# Patient Record
Sex: Female | Born: 1990 | Race: Black or African American | Hispanic: No | Marital: Single
Health system: Southern US, Community
[De-identification: ages and names within clinical notes are randomized; demographics above are authoritative.]

## PROBLEM LIST (undated history)

## (undated) DIAGNOSIS — R000 Tachycardia, unspecified: Secondary | ICD-10-CM

## (undated) DIAGNOSIS — I950 Idiopathic hypotension: Secondary | ICD-10-CM

## (undated) DIAGNOSIS — G931 Anoxic brain damage, not elsewhere classified: Secondary | ICD-10-CM

## (undated) DIAGNOSIS — B199 Unspecified viral hepatitis without hepatic coma: Secondary | ICD-10-CM

## (undated) DIAGNOSIS — H16012 Central corneal ulcer, left eye: Secondary | ICD-10-CM

## (undated) DIAGNOSIS — I469 Cardiac arrest, cause unspecified: Secondary | ICD-10-CM

## (undated) DIAGNOSIS — J9600 Acute respiratory failure, unspecified whether with hypoxia or hypercapnia: Secondary | ICD-10-CM

---

## 2017-11-27 DIAGNOSIS — Z930 Tracheostomy status: Secondary | ICD-10-CM

## 2017-11-27 DIAGNOSIS — J9620 Acute and chronic respiratory failure, unspecified whether with hypoxia or hypercapnia: Secondary | ICD-10-CM | POA: Diagnosis not present

## 2017-11-27 DIAGNOSIS — J181 Lobar pneumonia, unspecified organism: Secondary | ICD-10-CM | POA: Diagnosis not present

## 2017-11-27 DIAGNOSIS — G931 Anoxic brain damage, not elsewhere classified: Secondary | ICD-10-CM

## 2017-11-28 DIAGNOSIS — J9620 Acute and chronic respiratory failure, unspecified whether with hypoxia or hypercapnia: Secondary | ICD-10-CM

## 2017-11-28 DIAGNOSIS — Z930 Tracheostomy status: Secondary | ICD-10-CM | POA: Diagnosis not present

## 2017-11-28 DIAGNOSIS — J181 Lobar pneumonia, unspecified organism: Secondary | ICD-10-CM

## 2017-11-28 DIAGNOSIS — G931 Anoxic brain damage, not elsewhere classified: Secondary | ICD-10-CM | POA: Diagnosis not present

## 2017-11-29 DIAGNOSIS — J9620 Acute and chronic respiratory failure, unspecified whether with hypoxia or hypercapnia: Secondary | ICD-10-CM | POA: Diagnosis not present

## 2017-11-29 DIAGNOSIS — J181 Lobar pneumonia, unspecified organism: Secondary | ICD-10-CM | POA: Diagnosis not present

## 2017-11-29 DIAGNOSIS — Z930 Tracheostomy status: Secondary | ICD-10-CM | POA: Diagnosis not present

## 2017-11-29 DIAGNOSIS — G931 Anoxic brain damage, not elsewhere classified: Secondary | ICD-10-CM

## 2017-11-30 DIAGNOSIS — J9620 Acute and chronic respiratory failure, unspecified whether with hypoxia or hypercapnia: Secondary | ICD-10-CM

## 2017-11-30 DIAGNOSIS — Z930 Tracheostomy status: Secondary | ICD-10-CM

## 2017-11-30 DIAGNOSIS — G931 Anoxic brain damage, not elsewhere classified: Secondary | ICD-10-CM

## 2017-11-30 DIAGNOSIS — J181 Lobar pneumonia, unspecified organism: Secondary | ICD-10-CM | POA: Diagnosis not present

## 2017-12-01 DIAGNOSIS — J181 Lobar pneumonia, unspecified organism: Secondary | ICD-10-CM | POA: Diagnosis not present

## 2017-12-01 DIAGNOSIS — Z930 Tracheostomy status: Secondary | ICD-10-CM | POA: Diagnosis not present

## 2017-12-01 DIAGNOSIS — G931 Anoxic brain damage, not elsewhere classified: Secondary | ICD-10-CM | POA: Diagnosis not present

## 2017-12-01 DIAGNOSIS — J9620 Acute and chronic respiratory failure, unspecified whether with hypoxia or hypercapnia: Secondary | ICD-10-CM

## 2017-12-02 DIAGNOSIS — J9620 Acute and chronic respiratory failure, unspecified whether with hypoxia or hypercapnia: Secondary | ICD-10-CM

## 2017-12-02 DIAGNOSIS — J181 Lobar pneumonia, unspecified organism: Secondary | ICD-10-CM

## 2017-12-02 DIAGNOSIS — G931 Anoxic brain damage, not elsewhere classified: Secondary | ICD-10-CM

## 2017-12-02 DIAGNOSIS — Z930 Tracheostomy status: Secondary | ICD-10-CM

## 2017-12-03 DIAGNOSIS — G931 Anoxic brain damage, not elsewhere classified: Secondary | ICD-10-CM

## 2017-12-03 DIAGNOSIS — J181 Lobar pneumonia, unspecified organism: Secondary | ICD-10-CM

## 2017-12-03 DIAGNOSIS — Z930 Tracheostomy status: Secondary | ICD-10-CM | POA: Diagnosis not present

## 2017-12-03 DIAGNOSIS — J9620 Acute and chronic respiratory failure, unspecified whether with hypoxia or hypercapnia: Secondary | ICD-10-CM

## 2017-12-11 DIAGNOSIS — G931 Anoxic brain damage, not elsewhere classified: Secondary | ICD-10-CM

## 2017-12-11 DIAGNOSIS — J9620 Acute and chronic respiratory failure, unspecified whether with hypoxia or hypercapnia: Secondary | ICD-10-CM

## 2017-12-11 DIAGNOSIS — J181 Lobar pneumonia, unspecified organism: Secondary | ICD-10-CM | POA: Diagnosis not present

## 2017-12-11 DIAGNOSIS — Z930 Tracheostomy status: Secondary | ICD-10-CM | POA: Diagnosis not present

## 2017-12-12 DIAGNOSIS — Z930 Tracheostomy status: Secondary | ICD-10-CM | POA: Diagnosis not present

## 2017-12-12 DIAGNOSIS — J9620 Acute and chronic respiratory failure, unspecified whether with hypoxia or hypercapnia: Secondary | ICD-10-CM | POA: Diagnosis not present

## 2017-12-12 DIAGNOSIS — G931 Anoxic brain damage, not elsewhere classified: Secondary | ICD-10-CM | POA: Diagnosis not present

## 2017-12-12 DIAGNOSIS — J181 Lobar pneumonia, unspecified organism: Secondary | ICD-10-CM

## 2017-12-13 DIAGNOSIS — Z930 Tracheostomy status: Secondary | ICD-10-CM | POA: Diagnosis not present

## 2017-12-13 DIAGNOSIS — J181 Lobar pneumonia, unspecified organism: Secondary | ICD-10-CM | POA: Diagnosis not present

## 2017-12-13 DIAGNOSIS — G931 Anoxic brain damage, not elsewhere classified: Secondary | ICD-10-CM

## 2017-12-13 DIAGNOSIS — J9620 Acute and chronic respiratory failure, unspecified whether with hypoxia or hypercapnia: Secondary | ICD-10-CM | POA: Diagnosis not present

## 2017-12-14 DIAGNOSIS — Z930 Tracheostomy status: Secondary | ICD-10-CM

## 2017-12-14 DIAGNOSIS — J181 Lobar pneumonia, unspecified organism: Secondary | ICD-10-CM

## 2017-12-14 DIAGNOSIS — J9620 Acute and chronic respiratory failure, unspecified whether with hypoxia or hypercapnia: Secondary | ICD-10-CM

## 2017-12-14 DIAGNOSIS — G931 Anoxic brain damage, not elsewhere classified: Secondary | ICD-10-CM

## 2017-12-15 DIAGNOSIS — G931 Anoxic brain damage, not elsewhere classified: Secondary | ICD-10-CM | POA: Diagnosis not present

## 2017-12-15 DIAGNOSIS — J181 Lobar pneumonia, unspecified organism: Secondary | ICD-10-CM

## 2017-12-15 DIAGNOSIS — J9620 Acute and chronic respiratory failure, unspecified whether with hypoxia or hypercapnia: Secondary | ICD-10-CM | POA: Diagnosis not present

## 2017-12-15 DIAGNOSIS — Z930 Tracheostomy status: Secondary | ICD-10-CM

## 2017-12-16 DIAGNOSIS — Z930 Tracheostomy status: Secondary | ICD-10-CM | POA: Diagnosis not present

## 2017-12-16 DIAGNOSIS — G931 Anoxic brain damage, not elsewhere classified: Secondary | ICD-10-CM | POA: Diagnosis not present

## 2017-12-16 DIAGNOSIS — J181 Lobar pneumonia, unspecified organism: Secondary | ICD-10-CM

## 2017-12-16 DIAGNOSIS — J9620 Acute and chronic respiratory failure, unspecified whether with hypoxia or hypercapnia: Secondary | ICD-10-CM

## 2017-12-17 DIAGNOSIS — J181 Lobar pneumonia, unspecified organism: Secondary | ICD-10-CM | POA: Diagnosis not present

## 2017-12-17 DIAGNOSIS — Z930 Tracheostomy status: Secondary | ICD-10-CM | POA: Diagnosis not present

## 2017-12-17 DIAGNOSIS — G931 Anoxic brain damage, not elsewhere classified: Secondary | ICD-10-CM

## 2017-12-17 DIAGNOSIS — J9620 Acute and chronic respiratory failure, unspecified whether with hypoxia or hypercapnia: Secondary | ICD-10-CM

## 2018-01-08 DIAGNOSIS — Z930 Tracheostomy status: Secondary | ICD-10-CM

## 2018-01-08 DIAGNOSIS — J181 Lobar pneumonia, unspecified organism: Secondary | ICD-10-CM | POA: Diagnosis not present

## 2018-01-08 DIAGNOSIS — G931 Anoxic brain damage, not elsewhere classified: Secondary | ICD-10-CM

## 2018-01-08 DIAGNOSIS — J9620 Acute and chronic respiratory failure, unspecified whether with hypoxia or hypercapnia: Secondary | ICD-10-CM

## 2018-01-09 DIAGNOSIS — J181 Lobar pneumonia, unspecified organism: Secondary | ICD-10-CM

## 2018-01-09 DIAGNOSIS — J9620 Acute and chronic respiratory failure, unspecified whether with hypoxia or hypercapnia: Secondary | ICD-10-CM

## 2018-01-09 DIAGNOSIS — Z930 Tracheostomy status: Secondary | ICD-10-CM

## 2018-01-09 DIAGNOSIS — G931 Anoxic brain damage, not elsewhere classified: Secondary | ICD-10-CM

## 2018-01-10 DIAGNOSIS — J9620 Acute and chronic respiratory failure, unspecified whether with hypoxia or hypercapnia: Secondary | ICD-10-CM

## 2018-01-10 DIAGNOSIS — Z930 Tracheostomy status: Secondary | ICD-10-CM | POA: Diagnosis not present

## 2018-01-10 DIAGNOSIS — G931 Anoxic brain damage, not elsewhere classified: Secondary | ICD-10-CM | POA: Diagnosis not present

## 2018-01-10 DIAGNOSIS — J181 Lobar pneumonia, unspecified organism: Secondary | ICD-10-CM

## 2018-01-11 DIAGNOSIS — J9620 Acute and chronic respiratory failure, unspecified whether with hypoxia or hypercapnia: Secondary | ICD-10-CM

## 2018-01-11 DIAGNOSIS — Z930 Tracheostomy status: Secondary | ICD-10-CM

## 2018-01-11 DIAGNOSIS — J181 Lobar pneumonia, unspecified organism: Secondary | ICD-10-CM

## 2018-01-11 DIAGNOSIS — G931 Anoxic brain damage, not elsewhere classified: Secondary | ICD-10-CM

## 2018-01-12 DIAGNOSIS — G931 Anoxic brain damage, not elsewhere classified: Secondary | ICD-10-CM

## 2018-01-12 DIAGNOSIS — J9620 Acute and chronic respiratory failure, unspecified whether with hypoxia or hypercapnia: Secondary | ICD-10-CM | POA: Diagnosis not present

## 2018-01-12 DIAGNOSIS — Z930 Tracheostomy status: Secondary | ICD-10-CM

## 2018-01-12 DIAGNOSIS — J181 Lobar pneumonia, unspecified organism: Secondary | ICD-10-CM | POA: Diagnosis not present

## 2018-01-13 DIAGNOSIS — Z930 Tracheostomy status: Secondary | ICD-10-CM | POA: Diagnosis not present

## 2018-01-13 DIAGNOSIS — G931 Anoxic brain damage, not elsewhere classified: Secondary | ICD-10-CM

## 2018-01-13 DIAGNOSIS — J9620 Acute and chronic respiratory failure, unspecified whether with hypoxia or hypercapnia: Secondary | ICD-10-CM | POA: Diagnosis not present

## 2018-01-13 DIAGNOSIS — J181 Lobar pneumonia, unspecified organism: Secondary | ICD-10-CM | POA: Diagnosis not present

## 2018-01-14 DIAGNOSIS — G931 Anoxic brain damage, not elsewhere classified: Secondary | ICD-10-CM

## 2018-01-14 DIAGNOSIS — Z930 Tracheostomy status: Secondary | ICD-10-CM

## 2018-01-14 DIAGNOSIS — J9620 Acute and chronic respiratory failure, unspecified whether with hypoxia or hypercapnia: Secondary | ICD-10-CM | POA: Diagnosis not present

## 2018-01-14 DIAGNOSIS — J181 Lobar pneumonia, unspecified organism: Secondary | ICD-10-CM | POA: Diagnosis not present

## 2018-01-22 DIAGNOSIS — Z930 Tracheostomy status: Secondary | ICD-10-CM | POA: Diagnosis not present

## 2018-01-22 DIAGNOSIS — J9620 Acute and chronic respiratory failure, unspecified whether with hypoxia or hypercapnia: Secondary | ICD-10-CM

## 2018-01-22 DIAGNOSIS — G931 Anoxic brain damage, not elsewhere classified: Secondary | ICD-10-CM

## 2018-01-22 DIAGNOSIS — J181 Lobar pneumonia, unspecified organism: Secondary | ICD-10-CM | POA: Diagnosis not present

## 2018-01-23 DIAGNOSIS — J9620 Acute and chronic respiratory failure, unspecified whether with hypoxia or hypercapnia: Secondary | ICD-10-CM

## 2018-01-23 DIAGNOSIS — Z930 Tracheostomy status: Secondary | ICD-10-CM | POA: Diagnosis not present

## 2018-01-23 DIAGNOSIS — G931 Anoxic brain damage, not elsewhere classified: Secondary | ICD-10-CM | POA: Diagnosis not present

## 2018-01-23 DIAGNOSIS — J181 Lobar pneumonia, unspecified organism: Secondary | ICD-10-CM | POA: Diagnosis not present

## 2018-01-24 DIAGNOSIS — J181 Lobar pneumonia, unspecified organism: Secondary | ICD-10-CM | POA: Diagnosis not present

## 2018-01-24 DIAGNOSIS — J9620 Acute and chronic respiratory failure, unspecified whether with hypoxia or hypercapnia: Secondary | ICD-10-CM

## 2018-01-24 DIAGNOSIS — Z930 Tracheostomy status: Secondary | ICD-10-CM

## 2018-01-24 DIAGNOSIS — G931 Anoxic brain damage, not elsewhere classified: Secondary | ICD-10-CM

## 2018-01-25 DIAGNOSIS — J9620 Acute and chronic respiratory failure, unspecified whether with hypoxia or hypercapnia: Secondary | ICD-10-CM

## 2018-01-25 DIAGNOSIS — G931 Anoxic brain damage, not elsewhere classified: Secondary | ICD-10-CM | POA: Diagnosis not present

## 2018-01-25 DIAGNOSIS — J181 Lobar pneumonia, unspecified organism: Secondary | ICD-10-CM

## 2018-01-25 DIAGNOSIS — Z930 Tracheostomy status: Secondary | ICD-10-CM | POA: Diagnosis not present

## 2018-01-26 DIAGNOSIS — G931 Anoxic brain damage, not elsewhere classified: Secondary | ICD-10-CM

## 2018-01-26 DIAGNOSIS — Z930 Tracheostomy status: Secondary | ICD-10-CM | POA: Diagnosis not present

## 2018-01-26 DIAGNOSIS — J9620 Acute and chronic respiratory failure, unspecified whether with hypoxia or hypercapnia: Secondary | ICD-10-CM | POA: Diagnosis not present

## 2018-01-26 DIAGNOSIS — J181 Lobar pneumonia, unspecified organism: Secondary | ICD-10-CM

## 2018-01-27 DIAGNOSIS — Z930 Tracheostomy status: Secondary | ICD-10-CM | POA: Diagnosis not present

## 2018-01-27 DIAGNOSIS — J9620 Acute and chronic respiratory failure, unspecified whether with hypoxia or hypercapnia: Secondary | ICD-10-CM | POA: Diagnosis not present

## 2018-01-27 DIAGNOSIS — J181 Lobar pneumonia, unspecified organism: Secondary | ICD-10-CM

## 2018-01-27 DIAGNOSIS — G931 Anoxic brain damage, not elsewhere classified: Secondary | ICD-10-CM

## 2018-01-28 DIAGNOSIS — G931 Anoxic brain damage, not elsewhere classified: Secondary | ICD-10-CM | POA: Diagnosis not present

## 2018-01-28 DIAGNOSIS — Z930 Tracheostomy status: Secondary | ICD-10-CM

## 2018-01-28 DIAGNOSIS — J181 Lobar pneumonia, unspecified organism: Secondary | ICD-10-CM | POA: Diagnosis not present

## 2018-01-28 DIAGNOSIS — J9620 Acute and chronic respiratory failure, unspecified whether with hypoxia or hypercapnia: Secondary | ICD-10-CM

## 2018-01-29 DIAGNOSIS — J181 Lobar pneumonia, unspecified organism: Secondary | ICD-10-CM

## 2018-01-29 DIAGNOSIS — G931 Anoxic brain damage, not elsewhere classified: Secondary | ICD-10-CM | POA: Diagnosis not present

## 2018-01-29 DIAGNOSIS — Z930 Tracheostomy status: Secondary | ICD-10-CM | POA: Diagnosis not present

## 2018-01-29 DIAGNOSIS — J9620 Acute and chronic respiratory failure, unspecified whether with hypoxia or hypercapnia: Secondary | ICD-10-CM | POA: Diagnosis not present

## 2018-01-30 DIAGNOSIS — G931 Anoxic brain damage, not elsewhere classified: Secondary | ICD-10-CM | POA: Diagnosis not present

## 2018-01-30 DIAGNOSIS — Z930 Tracheostomy status: Secondary | ICD-10-CM

## 2018-01-30 DIAGNOSIS — J181 Lobar pneumonia, unspecified organism: Secondary | ICD-10-CM | POA: Diagnosis not present

## 2018-01-30 DIAGNOSIS — J9620 Acute and chronic respiratory failure, unspecified whether with hypoxia or hypercapnia: Secondary | ICD-10-CM

## 2018-01-31 DIAGNOSIS — G931 Anoxic brain damage, not elsewhere classified: Secondary | ICD-10-CM | POA: Diagnosis not present

## 2018-01-31 DIAGNOSIS — J9620 Acute and chronic respiratory failure, unspecified whether with hypoxia or hypercapnia: Secondary | ICD-10-CM | POA: Diagnosis not present

## 2018-01-31 DIAGNOSIS — Z930 Tracheostomy status: Secondary | ICD-10-CM | POA: Diagnosis not present

## 2018-01-31 DIAGNOSIS — J181 Lobar pneumonia, unspecified organism: Secondary | ICD-10-CM | POA: Diagnosis not present

## 2018-02-01 DIAGNOSIS — J181 Lobar pneumonia, unspecified organism: Secondary | ICD-10-CM

## 2018-02-01 DIAGNOSIS — G931 Anoxic brain damage, not elsewhere classified: Secondary | ICD-10-CM | POA: Diagnosis not present

## 2018-02-01 DIAGNOSIS — J9620 Acute and chronic respiratory failure, unspecified whether with hypoxia or hypercapnia: Secondary | ICD-10-CM

## 2018-02-01 DIAGNOSIS — Z930 Tracheostomy status: Secondary | ICD-10-CM

## 2018-02-02 DIAGNOSIS — J181 Lobar pneumonia, unspecified organism: Secondary | ICD-10-CM

## 2018-02-02 DIAGNOSIS — J9620 Acute and chronic respiratory failure, unspecified whether with hypoxia or hypercapnia: Secondary | ICD-10-CM | POA: Diagnosis not present

## 2018-02-02 DIAGNOSIS — G931 Anoxic brain damage, not elsewhere classified: Secondary | ICD-10-CM

## 2018-02-02 DIAGNOSIS — Z930 Tracheostomy status: Secondary | ICD-10-CM | POA: Diagnosis not present

## 2018-02-03 DIAGNOSIS — G931 Anoxic brain damage, not elsewhere classified: Secondary | ICD-10-CM

## 2018-02-03 DIAGNOSIS — Z930 Tracheostomy status: Secondary | ICD-10-CM | POA: Diagnosis not present

## 2018-02-03 DIAGNOSIS — J9620 Acute and chronic respiratory failure, unspecified whether with hypoxia or hypercapnia: Secondary | ICD-10-CM

## 2018-02-03 DIAGNOSIS — J181 Lobar pneumonia, unspecified organism: Secondary | ICD-10-CM | POA: Diagnosis not present

## 2018-02-04 DIAGNOSIS — Z930 Tracheostomy status: Secondary | ICD-10-CM | POA: Diagnosis not present

## 2018-02-04 DIAGNOSIS — J181 Lobar pneumonia, unspecified organism: Secondary | ICD-10-CM

## 2018-02-04 DIAGNOSIS — J9620 Acute and chronic respiratory failure, unspecified whether with hypoxia or hypercapnia: Secondary | ICD-10-CM

## 2018-02-04 DIAGNOSIS — G931 Anoxic brain damage, not elsewhere classified: Secondary | ICD-10-CM

## 2018-02-05 DIAGNOSIS — J181 Lobar pneumonia, unspecified organism: Secondary | ICD-10-CM

## 2018-02-05 DIAGNOSIS — G931 Anoxic brain damage, not elsewhere classified: Secondary | ICD-10-CM

## 2018-02-05 DIAGNOSIS — Z930 Tracheostomy status: Secondary | ICD-10-CM

## 2018-02-05 DIAGNOSIS — J9620 Acute and chronic respiratory failure, unspecified whether with hypoxia or hypercapnia: Secondary | ICD-10-CM

## 2018-02-06 DIAGNOSIS — J181 Lobar pneumonia, unspecified organism: Secondary | ICD-10-CM

## 2018-02-06 DIAGNOSIS — Z930 Tracheostomy status: Secondary | ICD-10-CM | POA: Diagnosis not present

## 2018-02-06 DIAGNOSIS — J9620 Acute and chronic respiratory failure, unspecified whether with hypoxia or hypercapnia: Secondary | ICD-10-CM | POA: Diagnosis not present

## 2018-02-06 DIAGNOSIS — G931 Anoxic brain damage, not elsewhere classified: Secondary | ICD-10-CM

## 2018-02-07 DIAGNOSIS — J9620 Acute and chronic respiratory failure, unspecified whether with hypoxia or hypercapnia: Secondary | ICD-10-CM | POA: Diagnosis not present

## 2018-02-07 DIAGNOSIS — Z930 Tracheostomy status: Secondary | ICD-10-CM

## 2018-02-07 DIAGNOSIS — G931 Anoxic brain damage, not elsewhere classified: Secondary | ICD-10-CM

## 2018-02-07 DIAGNOSIS — J181 Lobar pneumonia, unspecified organism: Secondary | ICD-10-CM

## 2018-02-08 DIAGNOSIS — Z930 Tracheostomy status: Secondary | ICD-10-CM | POA: Diagnosis not present

## 2018-02-08 DIAGNOSIS — J9620 Acute and chronic respiratory failure, unspecified whether with hypoxia or hypercapnia: Secondary | ICD-10-CM | POA: Diagnosis not present

## 2018-02-08 DIAGNOSIS — G931 Anoxic brain damage, not elsewhere classified: Secondary | ICD-10-CM

## 2018-02-08 DIAGNOSIS — J181 Lobar pneumonia, unspecified organism: Secondary | ICD-10-CM

## 2018-02-09 DIAGNOSIS — Z930 Tracheostomy status: Secondary | ICD-10-CM

## 2018-02-09 DIAGNOSIS — G931 Anoxic brain damage, not elsewhere classified: Secondary | ICD-10-CM

## 2018-02-09 DIAGNOSIS — J181 Lobar pneumonia, unspecified organism: Secondary | ICD-10-CM

## 2018-02-09 DIAGNOSIS — J9620 Acute and chronic respiratory failure, unspecified whether with hypoxia or hypercapnia: Secondary | ICD-10-CM

## 2018-02-10 DIAGNOSIS — J9620 Acute and chronic respiratory failure, unspecified whether with hypoxia or hypercapnia: Secondary | ICD-10-CM | POA: Diagnosis not present

## 2018-02-10 DIAGNOSIS — G931 Anoxic brain damage, not elsewhere classified: Secondary | ICD-10-CM | POA: Diagnosis not present

## 2018-02-10 DIAGNOSIS — Z930 Tracheostomy status: Secondary | ICD-10-CM | POA: Diagnosis not present

## 2018-02-10 DIAGNOSIS — J181 Lobar pneumonia, unspecified organism: Secondary | ICD-10-CM

## 2018-02-11 DIAGNOSIS — G931 Anoxic brain damage, not elsewhere classified: Secondary | ICD-10-CM | POA: Diagnosis not present

## 2018-02-11 DIAGNOSIS — Z930 Tracheostomy status: Secondary | ICD-10-CM

## 2018-02-11 DIAGNOSIS — J181 Lobar pneumonia, unspecified organism: Secondary | ICD-10-CM | POA: Diagnosis not present

## 2018-02-11 DIAGNOSIS — J9620 Acute and chronic respiratory failure, unspecified whether with hypoxia or hypercapnia: Secondary | ICD-10-CM | POA: Diagnosis not present

## 2018-02-26 DIAGNOSIS — J181 Lobar pneumonia, unspecified organism: Secondary | ICD-10-CM

## 2018-02-26 DIAGNOSIS — G931 Anoxic brain damage, not elsewhere classified: Secondary | ICD-10-CM

## 2018-02-26 DIAGNOSIS — Z930 Tracheostomy status: Secondary | ICD-10-CM

## 2018-02-26 DIAGNOSIS — J9620 Acute and chronic respiratory failure, unspecified whether with hypoxia or hypercapnia: Secondary | ICD-10-CM

## 2018-02-27 DIAGNOSIS — J9620 Acute and chronic respiratory failure, unspecified whether with hypoxia or hypercapnia: Secondary | ICD-10-CM

## 2018-02-27 DIAGNOSIS — G931 Anoxic brain damage, not elsewhere classified: Secondary | ICD-10-CM | POA: Diagnosis not present

## 2018-02-27 DIAGNOSIS — J181 Lobar pneumonia, unspecified organism: Secondary | ICD-10-CM

## 2018-02-27 DIAGNOSIS — Z930 Tracheostomy status: Secondary | ICD-10-CM

## 2018-02-28 DIAGNOSIS — J9620 Acute and chronic respiratory failure, unspecified whether with hypoxia or hypercapnia: Secondary | ICD-10-CM | POA: Diagnosis not present

## 2018-02-28 DIAGNOSIS — Z930 Tracheostomy status: Secondary | ICD-10-CM | POA: Diagnosis not present

## 2018-02-28 DIAGNOSIS — G931 Anoxic brain damage, not elsewhere classified: Secondary | ICD-10-CM | POA: Diagnosis not present

## 2018-02-28 DIAGNOSIS — J181 Lobar pneumonia, unspecified organism: Secondary | ICD-10-CM | POA: Diagnosis not present

## 2018-03-01 DIAGNOSIS — Z930 Tracheostomy status: Secondary | ICD-10-CM

## 2018-03-01 DIAGNOSIS — G931 Anoxic brain damage, not elsewhere classified: Secondary | ICD-10-CM

## 2018-03-01 DIAGNOSIS — J181 Lobar pneumonia, unspecified organism: Secondary | ICD-10-CM

## 2018-03-01 DIAGNOSIS — J9620 Acute and chronic respiratory failure, unspecified whether with hypoxia or hypercapnia: Secondary | ICD-10-CM

## 2018-03-02 DIAGNOSIS — G931 Anoxic brain damage, not elsewhere classified: Secondary | ICD-10-CM

## 2018-03-02 DIAGNOSIS — Z930 Tracheostomy status: Secondary | ICD-10-CM

## 2018-03-02 DIAGNOSIS — J181 Lobar pneumonia, unspecified organism: Secondary | ICD-10-CM | POA: Diagnosis not present

## 2018-03-02 DIAGNOSIS — J9620 Acute and chronic respiratory failure, unspecified whether with hypoxia or hypercapnia: Secondary | ICD-10-CM

## 2018-03-03 DIAGNOSIS — G931 Anoxic brain damage, not elsewhere classified: Secondary | ICD-10-CM | POA: Diagnosis not present

## 2018-03-03 DIAGNOSIS — J9620 Acute and chronic respiratory failure, unspecified whether with hypoxia or hypercapnia: Secondary | ICD-10-CM | POA: Diagnosis not present

## 2018-03-03 DIAGNOSIS — J181 Lobar pneumonia, unspecified organism: Secondary | ICD-10-CM | POA: Diagnosis not present

## 2018-03-03 DIAGNOSIS — Z930 Tracheostomy status: Secondary | ICD-10-CM | POA: Diagnosis not present

## 2018-03-04 DIAGNOSIS — J181 Lobar pneumonia, unspecified organism: Secondary | ICD-10-CM

## 2018-03-04 DIAGNOSIS — J9620 Acute and chronic respiratory failure, unspecified whether with hypoxia or hypercapnia: Secondary | ICD-10-CM | POA: Diagnosis not present

## 2018-03-04 DIAGNOSIS — Z930 Tracheostomy status: Secondary | ICD-10-CM | POA: Diagnosis not present

## 2018-03-04 DIAGNOSIS — G931 Anoxic brain damage, not elsewhere classified: Secondary | ICD-10-CM | POA: Diagnosis not present

## 2018-03-09 DIAGNOSIS — J181 Lobar pneumonia, unspecified organism: Secondary | ICD-10-CM

## 2018-03-09 DIAGNOSIS — Z930 Tracheostomy status: Secondary | ICD-10-CM

## 2018-03-09 DIAGNOSIS — J9620 Acute and chronic respiratory failure, unspecified whether with hypoxia or hypercapnia: Secondary | ICD-10-CM

## 2018-03-09 DIAGNOSIS — G931 Anoxic brain damage, not elsewhere classified: Secondary | ICD-10-CM | POA: Diagnosis not present

## 2018-03-10 DIAGNOSIS — J9620 Acute and chronic respiratory failure, unspecified whether with hypoxia or hypercapnia: Secondary | ICD-10-CM

## 2018-03-10 DIAGNOSIS — J181 Lobar pneumonia, unspecified organism: Secondary | ICD-10-CM

## 2018-03-10 DIAGNOSIS — Z930 Tracheostomy status: Secondary | ICD-10-CM

## 2018-03-10 DIAGNOSIS — G931 Anoxic brain damage, not elsewhere classified: Secondary | ICD-10-CM | POA: Diagnosis not present

## 2018-03-11 DIAGNOSIS — J181 Lobar pneumonia, unspecified organism: Secondary | ICD-10-CM

## 2018-03-11 DIAGNOSIS — J9620 Acute and chronic respiratory failure, unspecified whether with hypoxia or hypercapnia: Secondary | ICD-10-CM

## 2018-03-11 DIAGNOSIS — Z930 Tracheostomy status: Secondary | ICD-10-CM

## 2018-03-11 DIAGNOSIS — G931 Anoxic brain damage, not elsewhere classified: Secondary | ICD-10-CM

## 2018-03-12 DIAGNOSIS — J9620 Acute and chronic respiratory failure, unspecified whether with hypoxia or hypercapnia: Secondary | ICD-10-CM

## 2018-03-12 DIAGNOSIS — G931 Anoxic brain damage, not elsewhere classified: Secondary | ICD-10-CM

## 2018-03-12 DIAGNOSIS — Z930 Tracheostomy status: Secondary | ICD-10-CM

## 2018-03-12 DIAGNOSIS — J181 Lobar pneumonia, unspecified organism: Secondary | ICD-10-CM | POA: Diagnosis not present

## 2018-03-13 DIAGNOSIS — J181 Lobar pneumonia, unspecified organism: Secondary | ICD-10-CM

## 2018-03-13 DIAGNOSIS — J9620 Acute and chronic respiratory failure, unspecified whether with hypoxia or hypercapnia: Secondary | ICD-10-CM | POA: Diagnosis not present

## 2018-03-13 DIAGNOSIS — Z930 Tracheostomy status: Secondary | ICD-10-CM

## 2018-03-13 DIAGNOSIS — G931 Anoxic brain damage, not elsewhere classified: Secondary | ICD-10-CM | POA: Diagnosis not present

## 2018-03-14 DIAGNOSIS — J181 Lobar pneumonia, unspecified organism: Secondary | ICD-10-CM | POA: Diagnosis not present

## 2018-03-14 DIAGNOSIS — G931 Anoxic brain damage, not elsewhere classified: Secondary | ICD-10-CM

## 2018-03-14 DIAGNOSIS — J9620 Acute and chronic respiratory failure, unspecified whether with hypoxia or hypercapnia: Secondary | ICD-10-CM

## 2018-03-14 DIAGNOSIS — Z930 Tracheostomy status: Secondary | ICD-10-CM

## 2018-03-15 DIAGNOSIS — Z930 Tracheostomy status: Secondary | ICD-10-CM | POA: Diagnosis not present

## 2018-03-15 DIAGNOSIS — J181 Lobar pneumonia, unspecified organism: Secondary | ICD-10-CM

## 2018-03-15 DIAGNOSIS — J9620 Acute and chronic respiratory failure, unspecified whether with hypoxia or hypercapnia: Secondary | ICD-10-CM | POA: Diagnosis not present

## 2018-03-15 DIAGNOSIS — G931 Anoxic brain damage, not elsewhere classified: Secondary | ICD-10-CM

## 2018-03-26 DIAGNOSIS — J181 Lobar pneumonia, unspecified organism: Secondary | ICD-10-CM | POA: Diagnosis not present

## 2018-03-26 DIAGNOSIS — Z930 Tracheostomy status: Secondary | ICD-10-CM | POA: Diagnosis not present

## 2018-03-26 DIAGNOSIS — G931 Anoxic brain damage, not elsewhere classified: Secondary | ICD-10-CM | POA: Diagnosis not present

## 2018-03-26 DIAGNOSIS — J9620 Acute and chronic respiratory failure, unspecified whether with hypoxia or hypercapnia: Secondary | ICD-10-CM

## 2018-03-27 DIAGNOSIS — J9620 Acute and chronic respiratory failure, unspecified whether with hypoxia or hypercapnia: Secondary | ICD-10-CM

## 2018-03-27 DIAGNOSIS — J181 Lobar pneumonia, unspecified organism: Secondary | ICD-10-CM

## 2018-03-27 DIAGNOSIS — Z930 Tracheostomy status: Secondary | ICD-10-CM | POA: Diagnosis not present

## 2018-03-27 DIAGNOSIS — G931 Anoxic brain damage, not elsewhere classified: Secondary | ICD-10-CM | POA: Diagnosis not present

## 2018-03-28 DIAGNOSIS — Z930 Tracheostomy status: Secondary | ICD-10-CM

## 2018-03-28 DIAGNOSIS — G931 Anoxic brain damage, not elsewhere classified: Secondary | ICD-10-CM

## 2018-03-28 DIAGNOSIS — J9620 Acute and chronic respiratory failure, unspecified whether with hypoxia or hypercapnia: Secondary | ICD-10-CM

## 2018-03-28 DIAGNOSIS — J181 Lobar pneumonia, unspecified organism: Secondary | ICD-10-CM | POA: Diagnosis not present

## 2018-03-29 DIAGNOSIS — J9620 Acute and chronic respiratory failure, unspecified whether with hypoxia or hypercapnia: Secondary | ICD-10-CM

## 2018-03-29 DIAGNOSIS — J181 Lobar pneumonia, unspecified organism: Secondary | ICD-10-CM | POA: Diagnosis not present

## 2018-03-29 DIAGNOSIS — G931 Anoxic brain damage, not elsewhere classified: Secondary | ICD-10-CM | POA: Diagnosis not present

## 2018-03-29 DIAGNOSIS — Z930 Tracheostomy status: Secondary | ICD-10-CM | POA: Diagnosis not present

## 2018-03-30 DIAGNOSIS — Z930 Tracheostomy status: Secondary | ICD-10-CM | POA: Diagnosis not present

## 2018-03-30 DIAGNOSIS — J9620 Acute and chronic respiratory failure, unspecified whether with hypoxia or hypercapnia: Secondary | ICD-10-CM

## 2018-03-30 DIAGNOSIS — J181 Lobar pneumonia, unspecified organism: Secondary | ICD-10-CM | POA: Diagnosis not present

## 2018-03-30 DIAGNOSIS — G931 Anoxic brain damage, not elsewhere classified: Secondary | ICD-10-CM

## 2018-03-31 DIAGNOSIS — Z930 Tracheostomy status: Secondary | ICD-10-CM

## 2018-03-31 DIAGNOSIS — J9620 Acute and chronic respiratory failure, unspecified whether with hypoxia or hypercapnia: Secondary | ICD-10-CM

## 2018-03-31 DIAGNOSIS — G931 Anoxic brain damage, not elsewhere classified: Secondary | ICD-10-CM

## 2018-03-31 DIAGNOSIS — J181 Lobar pneumonia, unspecified organism: Secondary | ICD-10-CM | POA: Diagnosis not present

## 2018-04-01 DIAGNOSIS — J9620 Acute and chronic respiratory failure, unspecified whether with hypoxia or hypercapnia: Secondary | ICD-10-CM

## 2018-04-01 DIAGNOSIS — Z930 Tracheostomy status: Secondary | ICD-10-CM

## 2018-04-01 DIAGNOSIS — J181 Lobar pneumonia, unspecified organism: Secondary | ICD-10-CM

## 2018-04-01 DIAGNOSIS — G931 Anoxic brain damage, not elsewhere classified: Secondary | ICD-10-CM

## 2018-04-09 DIAGNOSIS — G931 Anoxic brain damage, not elsewhere classified: Secondary | ICD-10-CM | POA: Diagnosis not present

## 2018-04-09 DIAGNOSIS — Z930 Tracheostomy status: Secondary | ICD-10-CM | POA: Diagnosis not present

## 2018-04-09 DIAGNOSIS — J9620 Acute and chronic respiratory failure, unspecified whether with hypoxia or hypercapnia: Secondary | ICD-10-CM

## 2018-04-09 DIAGNOSIS — J181 Lobar pneumonia, unspecified organism: Secondary | ICD-10-CM

## 2018-04-10 DIAGNOSIS — G931 Anoxic brain damage, not elsewhere classified: Secondary | ICD-10-CM | POA: Diagnosis not present

## 2018-04-10 DIAGNOSIS — J181 Lobar pneumonia, unspecified organism: Secondary | ICD-10-CM

## 2018-04-10 DIAGNOSIS — J9620 Acute and chronic respiratory failure, unspecified whether with hypoxia or hypercapnia: Secondary | ICD-10-CM | POA: Diagnosis not present

## 2018-04-10 DIAGNOSIS — Z930 Tracheostomy status: Secondary | ICD-10-CM | POA: Diagnosis not present

## 2018-04-11 DIAGNOSIS — J9620 Acute and chronic respiratory failure, unspecified whether with hypoxia or hypercapnia: Secondary | ICD-10-CM

## 2018-04-11 DIAGNOSIS — G931 Anoxic brain damage, not elsewhere classified: Secondary | ICD-10-CM

## 2018-04-11 DIAGNOSIS — Z930 Tracheostomy status: Secondary | ICD-10-CM | POA: Diagnosis not present

## 2018-04-11 DIAGNOSIS — J181 Lobar pneumonia, unspecified organism: Secondary | ICD-10-CM

## 2018-04-12 DIAGNOSIS — J181 Lobar pneumonia, unspecified organism: Secondary | ICD-10-CM | POA: Diagnosis not present

## 2018-04-12 DIAGNOSIS — G931 Anoxic brain damage, not elsewhere classified: Secondary | ICD-10-CM | POA: Diagnosis not present

## 2018-04-12 DIAGNOSIS — Z930 Tracheostomy status: Secondary | ICD-10-CM | POA: Diagnosis not present

## 2018-04-12 DIAGNOSIS — J9620 Acute and chronic respiratory failure, unspecified whether with hypoxia or hypercapnia: Secondary | ICD-10-CM

## 2018-04-13 DIAGNOSIS — G931 Anoxic brain damage, not elsewhere classified: Secondary | ICD-10-CM

## 2018-04-13 DIAGNOSIS — J9620 Acute and chronic respiratory failure, unspecified whether with hypoxia or hypercapnia: Secondary | ICD-10-CM

## 2018-04-13 DIAGNOSIS — Z930 Tracheostomy status: Secondary | ICD-10-CM

## 2018-04-13 DIAGNOSIS — J181 Lobar pneumonia, unspecified organism: Secondary | ICD-10-CM

## 2018-04-14 DIAGNOSIS — G931 Anoxic brain damage, not elsewhere classified: Secondary | ICD-10-CM

## 2018-04-14 DIAGNOSIS — Z930 Tracheostomy status: Secondary | ICD-10-CM

## 2018-04-14 DIAGNOSIS — J9620 Acute and chronic respiratory failure, unspecified whether with hypoxia or hypercapnia: Secondary | ICD-10-CM | POA: Diagnosis not present

## 2018-04-14 DIAGNOSIS — J181 Lobar pneumonia, unspecified organism: Secondary | ICD-10-CM | POA: Diagnosis not present

## 2018-04-15 DIAGNOSIS — G931 Anoxic brain damage, not elsewhere classified: Secondary | ICD-10-CM

## 2018-04-15 DIAGNOSIS — Z930 Tracheostomy status: Secondary | ICD-10-CM | POA: Diagnosis not present

## 2018-04-15 DIAGNOSIS — J181 Lobar pneumonia, unspecified organism: Secondary | ICD-10-CM | POA: Diagnosis not present

## 2018-04-15 DIAGNOSIS — J9620 Acute and chronic respiratory failure, unspecified whether with hypoxia or hypercapnia: Secondary | ICD-10-CM

## 2018-04-22 DIAGNOSIS — J181 Lobar pneumonia, unspecified organism: Secondary | ICD-10-CM | POA: Diagnosis not present

## 2018-04-22 DIAGNOSIS — J9620 Acute and chronic respiratory failure, unspecified whether with hypoxia or hypercapnia: Secondary | ICD-10-CM

## 2018-04-22 DIAGNOSIS — Z930 Tracheostomy status: Secondary | ICD-10-CM

## 2018-04-22 DIAGNOSIS — G931 Anoxic brain damage, not elsewhere classified: Secondary | ICD-10-CM | POA: Diagnosis not present

## 2018-04-23 DIAGNOSIS — J181 Lobar pneumonia, unspecified organism: Secondary | ICD-10-CM

## 2018-04-23 DIAGNOSIS — Z930 Tracheostomy status: Secondary | ICD-10-CM | POA: Diagnosis not present

## 2018-04-23 DIAGNOSIS — G931 Anoxic brain damage, not elsewhere classified: Secondary | ICD-10-CM | POA: Diagnosis not present

## 2018-04-23 DIAGNOSIS — J9620 Acute and chronic respiratory failure, unspecified whether with hypoxia or hypercapnia: Secondary | ICD-10-CM | POA: Diagnosis not present

## 2018-04-24 DIAGNOSIS — J9620 Acute and chronic respiratory failure, unspecified whether with hypoxia or hypercapnia: Secondary | ICD-10-CM | POA: Diagnosis not present

## 2018-04-24 DIAGNOSIS — G931 Anoxic brain damage, not elsewhere classified: Secondary | ICD-10-CM

## 2018-04-24 DIAGNOSIS — J181 Lobar pneumonia, unspecified organism: Secondary | ICD-10-CM

## 2018-04-24 DIAGNOSIS — Z930 Tracheostomy status: Secondary | ICD-10-CM

## 2018-04-25 DIAGNOSIS — G931 Anoxic brain damage, not elsewhere classified: Secondary | ICD-10-CM | POA: Diagnosis not present

## 2018-04-25 DIAGNOSIS — J181 Lobar pneumonia, unspecified organism: Secondary | ICD-10-CM | POA: Diagnosis not present

## 2018-04-25 DIAGNOSIS — Z930 Tracheostomy status: Secondary | ICD-10-CM

## 2018-04-25 DIAGNOSIS — J9620 Acute and chronic respiratory failure, unspecified whether with hypoxia or hypercapnia: Secondary | ICD-10-CM | POA: Diagnosis not present

## 2018-04-26 DIAGNOSIS — J9620 Acute and chronic respiratory failure, unspecified whether with hypoxia or hypercapnia: Secondary | ICD-10-CM

## 2018-04-26 DIAGNOSIS — G931 Anoxic brain damage, not elsewhere classified: Secondary | ICD-10-CM | POA: Diagnosis not present

## 2018-04-26 DIAGNOSIS — Z930 Tracheostomy status: Secondary | ICD-10-CM

## 2018-04-26 DIAGNOSIS — J181 Lobar pneumonia, unspecified organism: Secondary | ICD-10-CM

## 2018-04-27 DIAGNOSIS — J9621 Acute and chronic respiratory failure with hypoxia: Secondary | ICD-10-CM

## 2018-04-27 DIAGNOSIS — J181 Lobar pneumonia, unspecified organism: Secondary | ICD-10-CM | POA: Diagnosis not present

## 2018-04-27 DIAGNOSIS — G931 Anoxic brain damage, not elsewhere classified: Secondary | ICD-10-CM | POA: Diagnosis not present

## 2018-04-27 DIAGNOSIS — Z930 Tracheostomy status: Secondary | ICD-10-CM

## 2018-04-28 DIAGNOSIS — Z930 Tracheostomy status: Secondary | ICD-10-CM

## 2018-04-28 DIAGNOSIS — J9620 Acute and chronic respiratory failure, unspecified whether with hypoxia or hypercapnia: Secondary | ICD-10-CM

## 2018-04-28 DIAGNOSIS — J181 Lobar pneumonia, unspecified organism: Secondary | ICD-10-CM

## 2018-04-28 DIAGNOSIS — G931 Anoxic brain damage, not elsewhere classified: Secondary | ICD-10-CM

## 2018-07-07 ENCOUNTER — Emergency Department (HOSPITAL_COMMUNITY)
Admission: EM | Admit: 2018-07-07 | Discharge: 2018-07-07 | Disposition: A | Discharge status: Home / Self Care | Payer: Medicaid Other | Attending: Emergency Medicine | Admitting: Emergency Medicine

## 2018-07-07 ENCOUNTER — Encounter (HOSPITAL_COMMUNITY): Payer: Self-pay | Admitting: *Deleted

## 2018-07-07 ENCOUNTER — Other Ambulatory Visit: Payer: Self-pay

## 2018-07-07 DIAGNOSIS — B029 Zoster without complications: Secondary | ICD-10-CM

## 2018-07-07 HISTORY — DX: Idiopathic hypotension: I95.0

## 2018-07-07 HISTORY — DX: Anoxic brain damage, not elsewhere classified: G93.1

## 2018-07-07 HISTORY — DX: Cardiac arrest, cause unspecified: I46.9

## 2018-07-07 HISTORY — DX: Unspecified viral hepatitis without hepatic coma: B19.9

## 2018-07-07 HISTORY — DX: Acute respiratory failure, unspecified whether with hypoxia or hypercapnia: J96.00

## 2018-07-07 HISTORY — DX: Central corneal ulcer, left eye: H16.012

## 2018-07-07 HISTORY — DX: Tachycardia, unspecified: R00.0

## 2018-07-07 MED ORDER — ACYCLOVIR 800 MG PO TABS: 800.0000 mg | ORAL_TABLET | Freq: Every day | ORAL | 0 refills | Status: DC

## 2018-07-07 NOTE — ED Triage Notes (Signed)
Pt here via [facility] from [facility] for possible abscess.  Blistering, swelling and redness to L abdomen.  Hypotensive en-route, given 250 bolus for bp in 70's.  sbp of 103 upon arrival.

## 2018-07-07 NOTE — ED Provider Notes (Signed)
[facility] EMERGENCY DEPARTMENT Provider Note   CSN: [id number] Arrival date & time: 07/07/18  1125     History   Chief Complaint Chief Complaint  Patient presents with  . Herpes Zoster  Level 5 caveat patient noncommunicative, chronically on responsive.  History is [facility]  HPI Kathy Buckley is a 28 y.o. female.  HPI patient developed a rash at left chest noticed this morning.  Staff at [facility] felt that it might be an abscess.  She was not evaluated by physician.  She was sent to the emergency department for possible drainage of abscess.  No treatment prior to coming here.  Past Medical History:  Diagnosis Date  . Acute respiratory failure (HCC)   . Anoxic brain damage (HCC)   . Cardiac arrest (HCC)   . Central corneal ulcer, left eye   . Idiopathic hypotension   . Tachyarrhythmia   . Viral hepatitis     There are no active problems to display for this patient.   History reviewed. No pertinent surgical history.   OB History   No obstetric history on file.      Home Medications    Prior to Admission medications   Medication Sig Start Date End Date Taking? Authorizing Provider  acyclovir (ZOVIRAX) 800 MG tablet Take 1 tablet (800 mg total) by mouth 5 (five) times daily. 07/07/18   Doug Sou, MD    Family History No family history on file.  Social History Social History   Tobacco Use  . Smoking status: Not on file  Substance Use Topics  . Alcohol use: Not on file  . Drug use: Not on file     Allergies   Other   Review of Systems Review of Systems  Unable to perform ROS: Patient unresponsive  Skin: Positive for wound.       Wound at left chest     Physical Exam Updated Vital Signs BP 103/69 (BP Location: Right Arm)   Pulse 84   Temp (!) 97 F (36.1 C) (Rectal)   Resp 16   Ht 5\' 5"  (1.651 m)   Wt 77.1 kg   SpO2 100%   BMI 28.29 kg/m     Physical Exam Vitals signs and nursing note reviewed.  Constitutional:      Comments: Unresponsive Glasgow Coma Score 3.  HENT:     Head: Normocephalic and atraumatic.     Nose: Nose normal.  Eyes:     Comments: Pupils fixed and dilated  Neck:     Comments: Tracheostomy in place Cardiovascular:     Rate and Rhythm: Normal rate.     Heart sounds: Normal heart sounds.  Pulmonary:     Breath sounds: Normal breath sounds.     Comments: On ventilator ventilator  Abdominal:     Palpations: Abdomen is soft.     Comments: Gastrostomy tube in place  Musculoskeletal:        General: No swelling.  Skin:    Capillary Refill: Capillary refill takes less than 2 seconds.     Comments: Herpetiform rash at left chest with mild surrounding redness.  Otherwise without rash  Neurological:     Comments: Unresponsive Glasgow Coma Score 3      ED Treatments / Results  Labs (all labs ordered are listed, but only abnormal results are displayed) Labs Reviewed - No data to display  EKG None  Radiology No results found.  Procedures Procedures (including critical care time)  Medications Ordered in ED Medications - No data to display   Initial Impression / Assessment and Plan / ED Course  I have reviewed the triage vital signs and the nursing notes.  Pertinent labs & imaging results that were available during my care of the patient were reviewed by me and considered in my medical decision making (see chart for details).     Rash consistent with herpes zoster plan prescription Zovirax.  Follow-up with PMD  Final Clinical Impressions(s) / ED Diagnoses   Final diagnoses:  Herpes zoster without complication    ED Discharge Orders         Ordered    acyclovir (ZOVIRAX) 800 MG tablet  5 times daily     07/07/18 1312           Doug SouJacubowitz, Daelon Dunivan, MD 07/07/18 1320

## 2018-07-07 NOTE — Progress Notes (Signed)
Patient came in from [facility] via EMS placed on above vent settings per facility, patient has a 7.0 mm portex Shiley.

## 2018-07-07 NOTE — ED Notes (Signed)
[facility]ed [facility] for pt transport back to [facility].  

## 2018-07-07 NOTE — Discharge Instructions (Addendum)
Give acyclovir 800 mg per G-tube 5 times daily for 1 week.  It is okay to crush the tablet.  Contact Ms. Krotz's physician next week to arrange for a follow-up visit

## 2018-07-07 NOTE — ED Notes (Signed)
RE transfer signatures.  Patient's mother and father were at bedside.  They were notified by MD that pt was being transferred back to [facility] and they were in agreement.  However, they left before transfer signature.

## 2018-10-21 ENCOUNTER — Emergency Department (HOSPITAL_COMMUNITY): Payer: Medicaid Other

## 2018-10-21 ENCOUNTER — Inpatient Hospital Stay (HOSPITAL_COMMUNITY)
Admission: EM | Admit: 2018-10-21 | Discharge: 2018-10-22 | DRG: 871 | Disposition: A | Discharge status: Other Acute Inpatient Hospital | Payer: Medicaid Other | Attending: Pulmonary Disease | Admitting: Pulmonary Disease

## 2018-10-21 DIAGNOSIS — Z1624 Resistance to multiple antibiotics: Secondary | ICD-10-CM | POA: Diagnosis present

## 2018-10-21 DIAGNOSIS — R739 Hyperglycemia, unspecified: Secondary | ICD-10-CM | POA: Diagnosis present

## 2018-10-21 DIAGNOSIS — D649 Anemia, unspecified: Secondary | ICD-10-CM | POA: Diagnosis not present

## 2018-10-21 DIAGNOSIS — R402212 Coma scale, best verbal response, none, at arrival to emergency department: Secondary | ICD-10-CM | POA: Diagnosis present

## 2018-10-21 DIAGNOSIS — Z452 Encounter for adjustment and management of vascular access device: Secondary | ICD-10-CM

## 2018-10-21 DIAGNOSIS — Y95 Nosocomial condition: Secondary | ICD-10-CM | POA: Diagnosis present

## 2018-10-21 DIAGNOSIS — R6520 Severe sepsis without septic shock: Secondary | ICD-10-CM | POA: Diagnosis not present

## 2018-10-21 DIAGNOSIS — Z930 Tracheostomy status: Secondary | ICD-10-CM

## 2018-10-21 DIAGNOSIS — Z79899 Other long term (current) drug therapy: Secondary | ICD-10-CM | POA: Diagnosis not present

## 2018-10-21 DIAGNOSIS — I5022 Chronic systolic (congestive) heart failure: Secondary | ICD-10-CM | POA: Diagnosis present

## 2018-10-21 DIAGNOSIS — Z8674 Personal history of sudden cardiac arrest: Secondary | ICD-10-CM | POA: Diagnosis not present

## 2018-10-21 DIAGNOSIS — R402312 Coma scale, best motor response, none, at arrival to emergency department: Secondary | ICD-10-CM | POA: Diagnosis present

## 2018-10-21 DIAGNOSIS — A419 Sepsis, unspecified organism: Secondary | ICD-10-CM | POA: Diagnosis present

## 2018-10-21 DIAGNOSIS — Z8673 Personal history of transient ischemic attack (TIA), and cerebral infarction without residual deficits: Secondary | ICD-10-CM | POA: Diagnosis not present

## 2018-10-21 DIAGNOSIS — J9601 Acute respiratory failure with hypoxia: Secondary | ICD-10-CM | POA: Diagnosis not present

## 2018-10-21 DIAGNOSIS — J80 Acute respiratory distress syndrome: Secondary | ICD-10-CM | POA: Diagnosis not present

## 2018-10-21 DIAGNOSIS — J9811 Atelectasis: Secondary | ICD-10-CM | POA: Diagnosis present

## 2018-10-21 DIAGNOSIS — Z888 Allergy status to other drugs, medicaments and biological substances status: Secondary | ICD-10-CM

## 2018-10-21 DIAGNOSIS — Z20828 Contact with and (suspected) exposure to other viral communicable diseases: Secondary | ICD-10-CM | POA: Diagnosis present

## 2018-10-21 DIAGNOSIS — N179 Acute kidney failure, unspecified: Secondary | ICD-10-CM | POA: Diagnosis not present

## 2018-10-21 DIAGNOSIS — J9621 Acute and chronic respiratory failure with hypoxia: Secondary | ICD-10-CM | POA: Diagnosis present

## 2018-10-21 DIAGNOSIS — Z931 Gastrostomy status: Secondary | ICD-10-CM | POA: Diagnosis not present

## 2018-10-21 DIAGNOSIS — D6489 Other specified anemias: Secondary | ICD-10-CM | POA: Diagnosis not present

## 2018-10-21 DIAGNOSIS — J189 Pneumonia, unspecified organism: Secondary | ICD-10-CM | POA: Diagnosis present

## 2018-10-21 DIAGNOSIS — E876 Hypokalemia: Secondary | ICD-10-CM | POA: Diagnosis present

## 2018-10-21 DIAGNOSIS — B199 Unspecified viral hepatitis without hepatic coma: Secondary | ICD-10-CM | POA: Diagnosis present

## 2018-10-21 DIAGNOSIS — Z515 Encounter for palliative care: Secondary | ICD-10-CM | POA: Diagnosis not present

## 2018-10-21 DIAGNOSIS — E8809 Other disorders of plasma-protein metabolism, not elsewhere classified: Secondary | ICD-10-CM | POA: Diagnosis present

## 2018-10-21 DIAGNOSIS — G931 Anoxic brain damage, not elsewhere classified: Secondary | ICD-10-CM | POA: Diagnosis present

## 2018-10-21 DIAGNOSIS — R402112 Coma scale, eyes open, never, at arrival to emergency department: Secondary | ICD-10-CM | POA: Diagnosis present

## 2018-10-21 DIAGNOSIS — R6521 Severe sepsis with septic shock: Secondary | ICD-10-CM | POA: Diagnosis not present

## 2018-10-21 DIAGNOSIS — J9622 Acute and chronic respiratory failure with hypercapnia: Secondary | ICD-10-CM | POA: Diagnosis not present

## 2018-10-21 DIAGNOSIS — J95851 Ventilator associated pneumonia: Secondary | ICD-10-CM | POA: Diagnosis not present

## 2018-10-21 DIAGNOSIS — Z7189 Other specified counseling: Secondary | ICD-10-CM | POA: Diagnosis not present

## 2018-10-21 LAB — CBC WITH DIFFERENTIAL/PLATELET
Abs Immature Granulocytes: 0.22 10*3/uL — ABNORMAL HIGH (ref 0.00–0.07)
Basophils Absolute: 0.1 10*3/uL (ref 0.0–0.1)
Basophils Relative: 0 %
Eosinophils Absolute: 0.2 10*3/uL (ref 0.0–0.5)
Eosinophils Relative: 1 %
HCT: 27.4 % — ABNORMAL LOW (ref 36.0–46.0)
Hemoglobin: 6.7 g/dL — CL (ref 12.0–15.0)
Immature Granulocytes: 1 %
Lymphocytes Relative: 9 %
Lymphs Abs: 2.1 10*3/uL (ref 0.7–4.0)
MCH: 19 pg — ABNORMAL LOW (ref 26.0–34.0)
MCHC: 24.5 g/dL — ABNORMAL LOW (ref 30.0–36.0)
MCV: 77.8 fL — ABNORMAL LOW (ref 80.0–100.0)
Monocytes Absolute: 1.9 10*3/uL — ABNORMAL HIGH (ref 0.1–1.0)
Monocytes Relative: 8 %
Neutro Abs: 18.3 10*3/uL — ABNORMAL HIGH (ref 1.7–7.7)
Neutrophils Relative %: 81 %
Platelets: 617 10*3/uL — ABNORMAL HIGH (ref 150–400)
RBC: 3.52 MIL/uL — ABNORMAL LOW (ref 3.87–5.11)
RDW: 24.9 % — ABNORMAL HIGH (ref 11.5–15.5)
WBC: 22.7 10*3/uL — ABNORMAL HIGH (ref 4.0–10.5)
nRBC: 1 % — ABNORMAL HIGH (ref 0.0–0.2)

## 2018-10-21 LAB — COMPREHENSIVE METABOLIC PANEL
ALT: 15 U/L (ref 0–44)
AST: 20 U/L (ref 15–41)
Albumin: 1.7 g/dL — ABNORMAL LOW (ref 3.5–5.0)
Alkaline Phosphatase: 179 U/L — ABNORMAL HIGH (ref 38–126)
Anion gap: 9 (ref 5–15)
BUN: 17 mg/dL (ref 6–20)
CO2: 35 mmol/L — ABNORMAL HIGH (ref 22–32)
Calcium: 9.2 mg/dL (ref 8.9–10.3)
Chloride: 96 mmol/L — ABNORMAL LOW (ref 98–111)
Creatinine, Ser: 0.51 mg/dL (ref 0.44–1.00)
GFR calc Af Amer: >60 mL/min (ref >60)
GFR calc non Af Amer: >60 mL/min (ref >60)
Glucose, Bld: 311 mg/dL — ABNORMAL HIGH (ref 70–99)
Potassium: 4.1 mmol/L (ref 3.5–5.1)
Sodium: 140 mmol/L (ref 135–145)
Total Bilirubin: 0.3 mg/dL (ref 0.3–1.2)
Total Protein: 9.9 g/dL — ABNORMAL HIGH (ref 6.5–8.1)

## 2018-10-21 LAB — PREALBUMIN: Prealbumin: 5.5 mg/dL — ABNORMAL LOW (ref 18–38)

## 2018-10-21 LAB — TRIGLYCERIDES: Triglycerides: 102 mg/dL (ref <150)

## 2018-10-21 LAB — GLUCOSE, CAPILLARY: Glucose-Capillary: 235 mg/dL — ABNORMAL HIGH (ref 70–99)

## 2018-10-21 LAB — ABO/RH: ABO/RH(D): O POS

## 2018-10-21 LAB — LACTIC ACID, PLASMA
Lactic Acid, Venous: 2.5 mmol/L (ref 0.5–1.9)
Lactic Acid, Venous: 2.7 mmol/L (ref 0.5–1.9)

## 2018-10-21 LAB — PREPARE RBC (CROSSMATCH)

## 2018-10-21 LAB — FERRITIN: Ferritin: 76 ng/mL (ref 11–307)

## 2018-10-21 LAB — FIBRINOGEN: Fibrinogen: >800 mg/dL — ABNORMAL HIGH (ref 210–475)

## 2018-10-21 LAB — LACTATE DEHYDROGENASE: LDH: 269 U/L — ABNORMAL HIGH (ref 98–192)

## 2018-10-21 LAB — PROCALCITONIN: Procalcitonin: 1.83 ng/mL

## 2018-10-21 LAB — C-REACTIVE PROTEIN: CRP: 32.7 mg/dL — ABNORMAL HIGH (ref <1.0)

## 2018-10-21 LAB — I-STAT BETA HCG BLOOD, ED (NOT ORDERABLE): I-stat hCG, quantitative: <5 m[IU]/mL (ref <5)

## 2018-10-21 LAB — D-DIMER, QUANTITATIVE (NOT AT ARMC): D-Dimer, Quant: 7.63 ug/mL-FEU — ABNORMAL HIGH (ref 0.00–0.50)

## 2018-10-21 MED ORDER — FAMOTIDINE IN NACL 20-0.9 MG/50ML-% IV SOLN: 20.0000 mg | Freq: Two times a day (BID) | INTRAVENOUS | Status: DC

## 2018-10-21 MED ORDER — ALBUTEROL SULFATE (2.5 MG/3ML) 0.083% IN NEBU
2.5000 mg | INHALATION_SOLUTION | RESPIRATORY_TRACT | Status: DC
  Administered 2018-10-22 (×3): 2.5 mg via RESPIRATORY_TRACT
  Filled 2018-10-21 (×4): qty 3

## 2018-10-21 MED ORDER — SODIUM CHLORIDE 0.9 % IV SOLN
2.0000 g | Freq: Three times a day (TID) | INTRAVENOUS | Status: DC
  Administered 2018-10-22 (×2): 2 g via INTRAVENOUS
  Filled 2018-10-21 (×4): qty 2

## 2018-10-21 MED ORDER — METRONIDAZOLE IN NACL 5-0.79 MG/ML-% IV SOLN
500.0000 mg | Freq: Once | INTRAVENOUS | Status: AC
  Administered 2018-10-21: 500 mg via INTRAVENOUS
  Filled 2018-10-21: qty 100

## 2018-10-21 MED ORDER — LACTATED RINGERS IV BOLUS (SEPSIS): 500.0000 mL | Freq: Once | INTRAVENOUS | Status: DC

## 2018-10-21 MED ORDER — FUROSEMIDE 10 MG/ML IJ SOLN
40.0000 mg | Freq: Once | INTRAMUSCULAR | Status: AC
  Administered 2018-10-22: 40 mg via INTRAVENOUS
  Filled 2018-10-21: qty 4

## 2018-10-21 MED ORDER — LACTATED RINGERS IV BOLUS (SEPSIS): 1000.0000 mL | Freq: Once | INTRAVENOUS | Status: DC

## 2018-10-21 MED ORDER — SODIUM CHLORIDE 0.9% IV SOLUTION: Freq: Once | INTRAVENOUS | Status: DC

## 2018-10-21 MED ORDER — FAMOTIDINE 40 MG/5ML PO SUSR
20.0000 mg | Freq: Two times a day (BID) | ORAL | Status: DC
  Administered 2018-10-21 – 2018-10-22 (×2): 20 mg
  Filled 2018-10-21 (×3): qty 2.5

## 2018-10-21 MED ORDER — INSULIN ASPART 100 UNIT/ML ~~LOC~~ SOLN
0.0000 [IU] | SUBCUTANEOUS | Status: DC
  Administered 2018-10-22: 04:00:00 3 [IU] via SUBCUTANEOUS
  Administered 2018-10-22: 01:00:00 5 [IU] via SUBCUTANEOUS
  Administered 2018-10-22: 08:00:00 3 [IU] via SUBCUTANEOUS

## 2018-10-21 MED ORDER — ORAL CARE MOUTH RINSE
15.0000 mL | OROMUCOSAL | Status: DC
  Administered 2018-10-21 – 2018-10-22 (×8): 15 mL via OROMUCOSAL

## 2018-10-21 MED ORDER — SODIUM CHLORIDE 0.9 % IV SOLN
2.0000 g | Freq: Once | INTRAVENOUS | Status: AC
  Administered 2018-10-21: 2 g via INTRAVENOUS
  Filled 2018-10-21: qty 2

## 2018-10-21 MED ORDER — VANCOMYCIN HCL IN DEXTROSE 750-5 MG/150ML-% IV SOLN
750.0000 mg | Freq: Three times a day (TID) | INTRAVENOUS | Status: DC
  Administered 2018-10-22 (×2): 750 mg via INTRAVENOUS
  Filled 2018-10-21 (×4): qty 150

## 2018-10-21 MED ORDER — VANCOMYCIN HCL IN DEXTROSE 1-5 GM/200ML-% IV SOLN
1000.0000 mg | Freq: Once | INTRAVENOUS | Status: AC
  Administered 2018-10-21: 1000 mg via INTRAVENOUS
  Filled 2018-10-21: qty 200

## 2018-10-21 MED ORDER — SODIUM CHLORIDE 0.9% FLUSH: 10.0000 mL | INTRAVENOUS | Status: DC | PRN

## 2018-10-21 MED ORDER — ENOXAPARIN SODIUM 40 MG/0.4ML ~~LOC~~ SOLN
40.0000 mg | SUBCUTANEOUS | Status: DC
  Filled 2018-10-21 (×2): qty 0.4

## 2018-10-21 MED ORDER — ALTEPLASE 2 MG IJ SOLR
2.0000 mg | Freq: Once | INTRAMUSCULAR | Status: AC
  Administered 2018-10-21: 2 mg
  Filled 2018-10-21: qty 2

## 2018-10-21 NOTE — ED Notes (Signed)
ED TO INPATIENT HANDOFF REPORT  ED Nurse Name and Phone #: Leafy Kindle [phone]  S Name/Age/Gender Kathy Buckley 28 y.o. female Room/Bed: RESUSC/RESUSC  Code Status   Code Status: Full Code  Home/SNF/Other Skilled nursing facility  Patient non responsive Is this baseline? Yes   Triage Complete: Triage complete  Chief Complaint FEVER/VENT/[facility] PT  Triage Note [facility] transported patient from [facility]. Has had increased heart and temperature despite antibiotic treatment for her pneumonia. She delivered her baby and threw a blood clot to the brain. Her last MRI showed no brain activity. She has a PICC line in her upper right arm, a PEG  tube in her LUQ and a foley catheter. According to [facility], patient is resistant to almost all antibiotics. Last vitals: 111/65, hr-119, rr-20 and 99 %.    Allergies Allergies  Allergen Reactions  . Chlorhexidine     Other reaction(s): Angioedema (ALLERGY/intolerance)  . Other     chlorahexidine    Level of Care/Admitting Diagnosis ED Disposition    ED Disposition Condition Comment   Admit  Hospital Area: [facility] [[id number]]  Level of Care: ICU [6]  Covid Evaluation: Person Under Investigation (PUI)  Isolation Risk Level: High Risk (Aerosolizing procedure, nebulizer, intubated/ventilation, CPAP/BiPAP)  Diagnosis: Acute and chronic respiratory failure with hypoxia [facility]) [[id number]]  Admitting Physician: Marcelle Smiling [[id number]]  Attending Physician: Marcelle Smiling [[id number]]  Estimated length of stay: > 1 week  Certification:: I certify this patient will need inpatient services for at least 2 midnights  PT Class (Do Not Modify): Inpatient [101]  PT Acc Code (Do Not Modify): Private [1]       B Medical/Surgery History Past Medical History:  Diagnosis Date  . Acute respiratory failure (HCC)   . Anoxic brain damage (HCC)   . Cardiac arrest (HCC)   . Central corneal ulcer, left eye   . Idiopathic  hypotension   . Tachyarrhythmia   . Viral hepatitis    No past surgical history on file.   A IV Location/Drains/Wounds Patient Lines/Drains/Airways Status   Active Line/Drains/Airways    Name:   Placement date:   Placement time:   Site:   Days:   Peripheral IV 10/21/18 Left;Lateral;Anterior Forearm   10/21/18    1856    Forearm   less than 1   PICC Single Lumen PICC Right   -    -    -      Tracheostomy Shiley 7 mm Cuffed   -    -    7 mm             Intake/Output Last 24 hours  Intake/Output Summary (Last 24 hours) at 10/21/2018 2142 Last data filed at 10/21/2018 2103 Gross per 24 hour  Intake 100 ml  Output -  Net 100 ml    Labs/Imaging Results for orders placed or performed during the hospital encounter of 10/21/18 (from the past 48 hour(s))  Lactic acid, plasma     Status: Abnormal   Collection Time: 10/21/18  6:32 PM  Result Value Ref Range   Lactic Acid, Venous 2.5 (HH) 0.5 - 1.9 mmol/L    Comment: CRITICAL RESULT CALLED TO, READ BACK BY AND VERIFIED WITH: Thayer Headings RN @ 1923 ON 10/21/2018 BY TEMOCHE, H Performed at [facility], [address] [place], [place] [address]   Comprehensive metabolic panel     Status: Abnormal   Collection Time: 10/21/18  6:37 PM  Result Value Ref Range  Sodium 140 135 - 145 mmol/L   Potassium 4.1 3.5 - 5.1 mmol/L   Chloride 96 (L) 98 - 111 mmol/L   CO2 35 (H) 22 - 32 mmol/L   Glucose, Bld 311 (H) 70 - 99 mg/dL   BUN 17 6 - 20 mg/dL   Creatinine, Ser 8.11 0.44 - 1.00 mg/dL   Calcium 9.2 8.9 - 91.4 mg/dL   Total Protein 9.9 (H) 6.5 - 8.1 g/dL   Albumin 1.7 (L) 3.5 - 5.0 g/dL   AST 20 15 - 41 U/L   ALT 15 0 - 44 U/L   Alkaline Phosphatase 179 (H) 38 - 126 U/L   Total Bilirubin 0.3 0.3 - 1.2 mg/dL   GFR calc non Af Amer >60 >60 mL/min   GFR calc Af Amer >60 >60 mL/min   Anion gap 9 5 - 15    Comment: Performed at [facility], [address], [place], [place] [address]  CBC WITH DIFFERENTIAL     Status:  Abnormal   Collection Time: 10/21/18  6:37 PM  Result Value Ref Range   WBC 22.7 (H) 4.0 - 10.5 K/uL   RBC 3.52 (L) 3.87 - 5.11 MIL/uL   Hemoglobin 6.7 (LL) 12.0 - 15.0 g/dL    Comment: REPEATED TO VERIFY Reticulocyte Hemoglobin testing may be clinically indicated, consider ordering this additional test [id number] THIS CRITICAL RESULT HAS VERIFIED AND BEEN CALLED TO RN R Davin Archuletta BY LESLIE BENFIELD ON 04 19 2020 AT 1905, AND HAS BEEN READ BACK.     HCT 27.4 (L) 36.0 - 46.0 %   MCV 77.8 (L) 80.0 - 100.0 fL   MCH 19.0 (L) 26.0 - 34.0 pg   MCHC 24.5 (L) 30.0 - 36.0 g/dL   RDW 78.4 (H) 69.6 - 29.5 %   Platelets 617 (H) 150 - 400 K/uL   nRBC 1.0 (H) 0.0 - 0.2 %   Neutrophils Relative % 81 %   Neutro Abs 18.3 (H) 1.7 - 7.7 K/uL   Lymphocytes Relative 9 %   Lymphs Abs 2.1 0.7 - 4.0 K/uL   Monocytes Relative 8 %   Monocytes Absolute 1.9 (H) 0.1 - 1.0 K/uL   Eosinophils Relative 1 %   Eosinophils Absolute 0.2 0.0 - 0.5 K/uL   Basophils Relative 0 %   Basophils Absolute 0.1 0.0 - 0.1 K/uL   WBC Morphology See Note     Comment: Dohle Bodies Toxic Granulation    Immature Granulocytes 1 %   Abs Immature Granulocytes 0.22 (H) 0.00 - 0.07 K/uL   Polychromasia PRESENT     Comment: Performed at [facility], [address] [place], [place] [address]  Type and screen     Status: None (Preliminary result)   Collection Time: 10/21/18  7:52 PM  Result Value Ref Range   ABO/RH(D) O POS    Antibody Screen NEG    Sample Expiration      10/24/2018 Performed at [facility], [address] [place], [place] [address]    Unit Number [id number]    Blood Component Type RED CELLS,LR    Unit division 00    Status of Unit ALLOCATED    Transfusion Status OK TO TRANSFUSE    Crossmatch Result Compatible   ABO/Rh     Status: None   Collection Time: 10/21/18  7:52 PM  Result Value Ref Range   ABO/RH(D)      O POS Performed at [facility], [address] [place], [place]  [address]   Lactic acid, plasma     Status: Abnormal   Collection Time: 10/21/18  8:00 PM  Result Value Ref Range   Lactic Acid, Venous 2.7 (HH) 0.5 - 1.9 mmol/L    Comment: CRITICAL RESULT CALLED TO, READ BACK BY AND VERIFIED WITH: Caroline SaugerWILLIAMS, D RN @ 2046 ON 10/21/2018 BY TEMOCHE, H Performed at [facility], [address] [place], [place] [address]   Prepare RBC     Status: None   Collection Time: 10/21/18  9:16 PM  Result Value Ref Range   Order Confirmation      ORDER PROCESSED BY BLOOD BANK Performed at [facility], [address] [place], [place] [address]    Dg Chest Port 1 View  Result Date: 10/21/2018 CLINICAL DATA:  Fever with sepsis. EXAM: PORTABLE CHEST 1 VIEW COMPARISON:  10/26/2017 FINDINGS: Tracheostomy tube tip is approximately 4.6 cm above the base of the carina. Right PICC line tip overlies the distal SVC level. Patchy bilateral airspace disease noted, left greater than right with some sparing of the upper lungs. The cardio pericardial silhouette is enlarged. Shift of cardiomediastinal anatomy towards the right suggest component of lung collapse at the right base. Bones diffusely demineralized. Telemetry leads overlie the chest. IMPRESSION: Bilateral mid and lower lung airspace disease, left greater than right with volume loss in the right hemithorax suggesting lower lung collapse. Imaging features suggest diffuse pneumonia Electronically Signed   By: Kennith CenterEric  Mansell M.D.   On: 10/21/2018 18:47    Pending Labs Unresulted Labs (From admission, onward)    Start     Ordered   10/28/18 0500  Creatinine, serum  (enoxaparin (LOVENOX)    CrCl >/= 30 ml/min)  Weekly,   R    Comments:  while on enoxaparin therapy    10/21/18 2112   10/22/18 0500  Procalcitonin  Daily,   R     10/21/18 2112   10/22/18 0500  CBC with Differential/Platelet  Tomorrow morning,   R     10/21/18 2114   10/22/18 0500  Basic metabolic panel  Tomorrow morning,   R     10/21/18 2114    10/22/18 0500  Magnesium  Tomorrow morning,   R     10/21/18 2114   10/22/18 0500  Phosphorus  Tomorrow morning,   R     10/21/18 2114   10/22/18 0500  Blood gas, arterial  Tomorrow morning,   R     10/21/18 2114   10/21/18 2115  Prealbumin  Add-on,   R     10/21/18 2114   10/21/18 2110  Culture, respiratory (tracheal aspirate)  Once,   R     10/21/18 2112   10/21/18 2107  HIV antibody (Routine Testing)  Once,   R     10/21/18 2112   10/21/18 2107  Procalcitonin - Baseline  ONCE - STAT,   STAT     10/21/18 2112   10/21/18 1805  Blood Culture (routine x 2)  BLOOD CULTURE X 2,   STAT     10/21/18 1804   10/21/18 1805  Urinalysis, Routine w reflex microscopic  ONCE - STAT,   STAT     10/21/18 1804          Vitals/Pain Today's Vitals   10/21/18 2045 10/21/18 2100 10/21/18 2115 10/21/18 2130  BP: 107/74 96/72 112/67 (!) 116/97  Pulse: (!) 111 (!) 112 (!) 107 (!) 118  Resp: 20 (!) 24 20 (!) 21  Temp:      TempSrc:      SpO2: 98% 93% 98% 97%  Weight:        Isolation Precautions No active isolations  Medications Medications  lactated ringers bolus 1,000 mL (has no administration in time range)    And  lactated ringers bolus 1,000 mL (has no administration in time range)    And  lactated ringers bolus 500 mL (has no administration in time range)  ceFEPIme (MAXIPIME) 2 g in sodium chloride 0.9 % 100 mL IVPB (has no administration in time range)  vancomycin (VANCOCIN) IVPB 750 mg/150 ml premix (has no administration in time range)  0.9 %  sodium chloride infusion (Manually program via Guardrails IV Fluids) (has no administration in time range)  enoxaparin (LOVENOX) injection 40 mg (has no administration in time range)  albuterol (PROVENTIL) (2.5 MG/3ML) 0.083% nebulizer solution 2.5 mg (has no administration in time range)  furosemide (LASIX) injection 40 mg (has no administration in time range)  famotidine (PEPCID) 40 MG/5ML suspension 20 mg (has no administration in time  range)  ceFEPIme (MAXIPIME) 2 g in sodium chloride 0.9 % 100 mL IVPB (2 g Intravenous New Bag/Given 10/21/18 2044)  metroNIDAZOLE (FLAGYL) IVPB 500 mg (0 mg Intravenous Stopped 10/21/18 2103)  vancomycin (VANCOCIN) IVPB 1000 mg/200 mL premix (1,000 mg Intravenous New Bag/Given 10/21/18 2042)  alteplase (CATHFLO ACTIVASE) injection 2 mg (2 mg Intracatheter Given 10/21/18 2024)    Mobility non-ambulatory High fall risk   Focused Assessments           R Recommendations: See Admitting Provider Note  Report given to:   Additional Notes:

## 2018-10-21 NOTE — ED Notes (Signed)
Pt's parents given an update on patient status

## 2018-10-21 NOTE — ED Triage Notes (Signed)
[facility] transported patient from [facility]. Has had increased heart and temperature despite antibiotic treatment for her pneumonia. She delivered her baby and threw a blood clot to the brain. Her last MRI showed no brain activity. She has a PICC line in her upper right arm, a PEG  tube in her LUQ and a foley catheter. According to [facility], patient is resistant to almost all antibiotics. Last vitals: 111/65, hr-119, rr-20 and 99 %.

## 2018-10-21 NOTE — ED Notes (Signed)
Spoke with patient's mother to give her an update- all questions answered at this time

## 2018-10-21 NOTE — ED Notes (Signed)
ED TO INPATIENT HANDOFF REPORT  ED Nurse Name and Phone #:  [phone]  S Name/Age/Gender Kathy Buckley 28 y.o. female Room/Bed: RESUSC/RESUSC  Code Status   Code Status: Full Code  Home/SNF/Other Rehab  Is this baseline? Yes   Triage Complete: Triage complete  Chief Complaint FEVER/VENT/[facility] PT  Triage Note [facility] from [facility]. Has had increased heart and temperature despite antibiotic treatment for her pneumonia. She delivered her baby and threw a blood clot to the brain. Her last MRI showed no brain activity. She has a PICC line in her upper right arm, a PEG  tube in her LUQ and a foley catheter. According to [facility], patient is resistant to almost all antibiotics. Last vitals: 111/65, hr-119, rr-20 and 99 %.    Allergies Allergies  Allergen Reactions  . Chlorhexidine     Other reaction(s): Angioedema (ALLERGY/intolerance)  . Other     chlorahexidine    Level of Care/Admitting Diagnosis ED Disposition    ED Disposition Condition Comment   Admit  Hospital Area: [facility] [[id number]]  Level of Care: ICU [6]  Covid Evaluation: Person Under Investigation (PUI)  Isolation Risk Level: High Risk (Aerosolizing procedure, nebulizer, intubated/ventilation, CPAP/BiPAP)  Diagnosis: Acute and chronic respiratory failure with hypoxia [facility]) [[id number]]  Admitting Physician: Marcelle Smiling [[id number]]  Attending Physician: Marcelle Smiling [[id number]]  Estimated length of stay: > 1 week  Certification:: I certify this patient will need inpatient services for at least 2 midnights  PT Class (Do Not Modify): Inpatient [101]  PT Acc Code (Do Not Modify): Private [1]       B Medical/Surgery History Past Medical History:  Diagnosis Date  . Acute respiratory failure (HCC)   . Anoxic brain damage (HCC)   . Cardiac arrest (HCC)   . Central corneal ulcer, left eye   . Idiopathic hypotension   . Tachyarrhythmia   . Viral hepatitis     No past surgical history on file.   A IV Location/Drains/Wounds Patient Lines/Drains/Airways Status   Active Line/Drains/Airways    Name:   Placement date:   Placement time:   Site:   Days:   Peripheral IV 10/21/18 Left;Lateral;Anterior Forearm   10/21/18    1856    Forearm   less than 1   PICC Single Lumen PICC Right   -    -    -      Tracheostomy Shiley 7 mm Cuffed   -    -    7 mm             Intake/Output Last 24 hours  Intake/Output Summary (Last 24 hours) at 10/21/2018 2142 Last data filed at 10/21/2018 2103 Gross per 24 hour  Intake 100 ml  Output -  Net 100 ml    Labs/Imaging Results for orders placed or performed during the hospital encounter of 10/21/18 (from the past 48 hour(s))  Lactic acid, plasma     Status: Abnormal   Collection Time: 10/21/18  6:32 PM  Result Value Ref Range   Lactic Acid, Venous 2.5 (HH) 0.5 - 1.9 mmol/L    Comment: CRITICAL RESULT CALLED TO, READ BACK BY AND VERIFIED WITH: Thayer Headings RN @ 1923 ON 10/21/2018 BY TEMOCHE, H Performed at [facility], [address], [place], [place] [address]   Comprehensive metabolic panel     Status: Abnormal   Collection Time: 10/21/18  6:37 PM  Result Value Ref Range   Sodium 140 135 -  145 mmol/L   Potassium 4.1 3.5 - 5.1 mmol/L   Chloride 96 (L) 98 - 111 mmol/L   CO2 35 (H) 22 - 32 mmol/L   Glucose, Bld 311 (H) 70 - 99 mg/dL   BUN 17 6 - 20 mg/dL   Creatinine, Ser 7.82 0.44 - 1.00 mg/dL   Calcium 9.2 8.9 - 95.6 mg/dL   Total Protein 9.9 (H) 6.5 - 8.1 g/dL   Albumin 1.7 (L) 3.5 - 5.0 g/dL   AST 20 15 - 41 U/L   ALT 15 0 - 44 U/L   Alkaline Phosphatase 179 (H) 38 - 126 U/L   Total Bilirubin 0.3 0.3 - 1.2 mg/dL   GFR calc non Af Amer >60 >60 mL/min   GFR calc Af Amer >60 >60 mL/min   Anion gap 9 5 - 15    Comment: Performed at [facility], [address] [place], [place] [address]  CBC WITH DIFFERENTIAL     Status: Abnormal   Collection Time: 10/21/18  6:37 PM  Result Value  Ref Range   WBC 22.7 (H) 4.0 - 10.5 K/uL   RBC 3.52 (L) 3.87 - 5.11 MIL/uL   Hemoglobin 6.7 (LL) 12.0 - 15.0 g/dL    Comment: REPEATED TO VERIFY Reticulocyte Hemoglobin testing may be clinically indicated, consider ordering this additional test [id number] THIS CRITICAL RESULT HAS VERIFIED AND BEEN CALLED TO RN R KENNEDY BY LESLIE BENFIELD ON 04 19 2020 AT 1905, AND HAS BEEN READ BACK.     HCT 27.4 (L) 36.0 - 46.0 %   MCV 77.8 (L) 80.0 - 100.0 fL   MCH 19.0 (L) 26.0 - 34.0 pg   MCHC 24.5 (L) 30.0 - 36.0 g/dL   RDW 29.5 (H) 28.4 - 13.2 %   Platelets 617 (H) 150 - 400 K/uL   nRBC 1.0 (H) 0.0 - 0.2 %   Neutrophils Relative % 81 %   Neutro Abs 18.3 (H) 1.7 - 7.7 K/uL   Lymphocytes Relative 9 %   Lymphs Abs 2.1 0.7 - 4.0 K/uL   Monocytes Relative 8 %   Monocytes Absolute 1.9 (H) 0.1 - 1.0 K/uL   Eosinophils Relative 1 %   Eosinophils Absolute 0.2 0.0 - 0.5 K/uL   Basophils Relative 0 %   Basophils Absolute 0.1 0.0 - 0.1 K/uL   WBC Morphology See Note     Comment: Dohle Bodies Toxic Granulation    Immature Granulocytes 1 %   Abs Immature Granulocytes 0.22 (H) 0.00 - 0.07 K/uL   Polychromasia PRESENT     Comment: Performed at [facility], [address], [place], [place] [address]  Type and screen     Status: None (Preliminary result)   Collection Time: 10/21/18  7:52 PM  Result Value Ref Range   ABO/RH(D) O POS    Antibody Screen NEG    Sample Expiration      10/24/2018 Performed at [facility], [address] [place], [place] [address]    Unit Number [id number]    Blood Component Type RED CELLS,LR    Unit division 00    Status of Unit ALLOCATED    Transfusion Status OK TO TRANSFUSE    Crossmatch Result Compatible   ABO/Rh     Status: None   Collection Time: 10/21/18  7:52 PM  Result Value Ref Range   ABO/RH(D)      O POS Performed at [facility], [address] [place],  [place] [address]   Lactic acid, plasma     Status: Abnormal    Collection Time: 10/21/18  8:00 PM  Result Value Ref Range   Lactic Acid, Venous 2.7 (HH) 0.5 - 1.9 mmol/L    Comment: CRITICAL RESULT CALLED TO, READ BACK BY AND VERIFIED WITH: Caroline Sauger RN @ 2046 ON 10/21/2018 BY TEMOCHE, H Performed at [facility], [address] [place], [place] [address]   Prepare RBC     Status: None   Collection Time: 10/21/18  9:16 PM  Result Value Ref Range   Order Confirmation      ORDER PROCESSED BY BLOOD BANK Performed at [facility], [address] [place], [place] [address]    Dg Chest Port 1 View  Result Date: 10/21/2018 CLINICAL DATA:  Fever with sepsis. EXAM: PORTABLE CHEST 1 VIEW COMPARISON:  10/26/2017 FINDINGS: Tracheostomy tube tip is approximately 4.6 cm above the base of the carina. Right PICC line tip overlies the distal SVC level. Patchy bilateral airspace disease noted, left greater than right with some sparing of the upper lungs. The cardio pericardial silhouette is enlarged. Shift of cardiomediastinal anatomy towards the right suggest component of lung collapse at the right base. Bones diffusely demineralized. Telemetry leads overlie the chest. IMPRESSION: Bilateral mid and lower lung airspace disease, left greater than right with volume loss in the right hemithorax suggesting lower lung collapse. Imaging features suggest diffuse pneumonia Electronically Signed   By: Kennith Center M.D.   On: 10/21/2018 18:47    Pending Labs Unresulted Labs (From admission, onward)    Start     Ordered   10/28/18 0500  Creatinine, serum  (enoxaparin (LOVENOX)    CrCl >/= 30 ml/min)  Weekly,   R    Comments:  while on enoxaparin therapy    10/21/18 2112   10/22/18 0500  Procalcitonin  Daily,   R     10/21/18 2112   10/22/18 0500  CBC with Differential/Platelet  Tomorrow morning,   R     10/21/18 2114   10/22/18 0500  Basic metabolic panel  Tomorrow morning,   R     10/21/18 2114   10/22/18 0500  Magnesium  Tomorrow morning,   R      10/21/18 2114   10/22/18 0500  Phosphorus  Tomorrow morning,   R     10/21/18 2114   10/22/18 0500  Blood gas, arterial  Tomorrow morning,   R     10/21/18 2114   10/21/18 2115  Prealbumin  Add-on,   R     10/21/18 2114   10/21/18 2110  Culture, respiratory (tracheal aspirate)  Once,   R     10/21/18 2112   10/21/18 2107  HIV antibody (Routine Testing)  Once,   R     10/21/18 2112   10/21/18 2107  Procalcitonin - Baseline  ONCE - STAT,   STAT     10/21/18 2112   10/21/18 1805  Blood Culture (routine x 2)  BLOOD CULTURE X 2,   STAT     10/21/18 1804   10/21/18 1805  Urinalysis, Routine w reflex microscopic  ONCE - STAT,   STAT     10/21/18 1804          Vitals/Pain Today's Vitals   10/21/18 2045 10/21/18 2100 10/21/18 2115 10/21/18 2130  BP: 107/74 96/72 112/67 (!) 116/97  Pulse: (!) 111 (!) 112 (!) 107 (!) 118  Resp: 20 (!) 24  20 (!) 21  Temp:      TempSrc:      SpO2: 98% 93% 98% 97%  Weight:        Isolation Precautions No active isolations  Medications Medications  lactated ringers bolus 1,000 mL (has no administration in time range)    And  lactated ringers bolus 1,000 mL (has no administration in time range)    And  lactated ringers bolus 500 mL (has no administration in time range)  ceFEPIme (MAXIPIME) 2 g in sodium chloride 0.9 % 100 mL IVPB (has no administration in time range)  vancomycin (VANCOCIN) IVPB 750 mg/150 ml premix (has no administration in time range)  0.9 %  sodium chloride infusion (Manually program via Guardrails IV Fluids) (has no administration in time range)  enoxaparin (LOVENOX) injection 40 mg (has no administration in time range)  albuterol (PROVENTIL) (2.5 MG/3ML) 0.083% nebulizer solution 2.5 mg (has no administration in time range)  furosemide (LASIX) injection 40 mg (has no administration in time range)  famotidine (PEPCID) 40 MG/5ML suspension 20 mg (has no administration in time range)  ceFEPIme (MAXIPIME) 2 g in sodium chloride 0.9  % 100 mL IVPB (2 g Intravenous New Bag/Given 10/21/18 2044)  metroNIDAZOLE (FLAGYL) IVPB 500 mg (0 mg Intravenous Stopped 10/21/18 2103)  vancomycin (VANCOCIN) IVPB 1000 mg/200 mL premix (1,000 mg Intravenous New Bag/Given 10/21/18 2042)  alteplase (CATHFLO ACTIVASE) injection 2 mg (2 mg Intracatheter Given 10/21/18 2024)    Mobility non-ambulatory High fall risk   Focused Assessments    R Recommendations: See Admitting Provider Note  Report given to:   Additional Notes:  Patient reacts to pain but that is all. Has fixed gaze and does not blink.

## 2018-10-21 NOTE — ED Notes (Signed)
Phlebotomy @ bedside  

## 2018-10-21 NOTE — H&P (Addendum)
NAME:  Kathy Buckley MRN:  [id number] DOB:  Dec 26, 1990 LOS: 0 ADMISSION DATE:  10/21/2018 DATE OF SERVICE:  10/21/2018  CHIEF COMPLAINT:  Fever, tachycardia  HISTORY & PHYSICAL  History of Present Illness  This 27 y.o. African American female [facility] from [facility] with complaints of fever and tachycardia despite antibiotic treatment for pneumonia.  According to [facility], she is apparently resistant to almost all antibiotics, although no mention is made of specific pathogen.  She has a history of ischemic stroke and anoxic encephalopathy, which is believed to be a postpartum complication in 10/2017 after delivering an infant at home (unaware of pregnancy, no prenatal care), which included severe postpartum hemorrhage.  She was found unresponsive by her mother at home in the postpartum period and was subsequently diagnosed to have sustained a stroke that left her neurologically devastated.  At that time, she appears to have received care at [facility].     REVIEW OF SYSTEMS This patient is critically ill and cannot provide additional history nor review of systems due to mental status/unconsciousness.   Past Medical/Surgical/Social/Family History   Past Medical History:  Diagnosis Date  . Acute respiratory failure (HCC)   . Anoxic brain damage (HCC)   . Cardiac arrest (HCC)   . Central corneal ulcer, left eye   . Idiopathic hypotension   . Tachyarrhythmia   . Viral hepatitis     No past surgical history on file.  Social History   Tobacco Use  . Smoking status: Not on file  Substance Use Topics  . Alcohol use: Not on file    No family history on file.   Procedures:  N/A   Significant Diagnostic Tests:  CT head (10/26/2017): patchy hypodensities in the R subinsular region and the parafalcine L frontal lobe concerning for acute infarcts.  Possible sequela of global hypoxic/ischemic injury. 2D-echo  (10/27/2017): LVEF 35-40% with septal and inferior wall hypokinesis.   Micro Data:  No results found for this or any previous visit.    Antimicrobials:  Zosyn/cefepime/TOBI (@ [facility]) Cefepime/Flagyl/vancomycin (4/19>>    Interim history/subjective:  N/A   Objective   BP 99/69   Pulse (!) 113   Temp (!) 101 F (38.3 C) (Rectal)   Resp 20   SpO2 100%     There were no vitals filed for this visit. No intake or output data in the 24 hours ending 10/21/18 1946  Vent Mode: PRVC FiO2 (%):  [90 %] 90 % Set Rate:  [20 bmp] 20 bmp Vt Set:  [500 mL] 500 mL PEEP:  [10 cmH20] 10 cmH20 Plateau Pressure:  [29 cmH20-35 cmH20] 35 cmH20   Examination: GENERAL: comatose.  On mechanical ventilatory support via tracheostomy.  No acute distress. HEAD: normocephalic, atraumatic EYE: dysconjugate gaze. Anisocoria (OS 4 mm > OD 3 mm), nonreactive.  No scleral icterus.  Conjunctival pallor. NOSE: nares are patent. Copious exudate.  THROAT/ORAL CAVITY: Normal dentition. No oral thrush. Mucous membranes are moist.  NECK: tracheostomy.  Supple, no thyromegaly, no JVD, no lymphadenopathy. Trachea midline. CHEST/LUNG: symmetric in development and expansion. Coarse breath sounds.  Scattered crackles and scant rhonchi. HEART: Regular S1 and S2 without murmur, rub or gallop.  Tachycardic. ABDOMEN: Protuberant abdomen.  PEG in situ.  Soft, nontender, nondistended.  Normoactive bowel sounds. No rebound. No guarding. No hepatosplenomegaly. EXTREMITIES: RUE PICC is clean and dry.  Edema: 2+ and pitting. No cyanosis. No clubbing. 2+ DP pulses.  L  foot drop. LYMPHATIC: no cervical/axillary/inguinal lymph nodes appreciated MUSCULOSKELETAL: No point tenderness. L > R bulk atrophy, most notable at LLE. Joints: no deformity.  SKIN:  No rash or lesion. NEUROLOGIC: No Doll's eyes. R corneal reflex intact. Synchronous with ventilator set rate. Dysconjugate gaze.  OS exotropia.  Facial symmetry.  Babinski absent.   No response to noxious stimuli.  Flaccid quadriplegia.  DTR: 2+ @ R biceps, 3+ @ L biceps, 1+ @ R patellar,  1+ @ L patellar.  Resolved Hospital Problem list      Assessment & Plan:   ASSESSMENT/PLAN:  ASSESSMENT (included in the Hospital Problem List)  Principal Problem:   Acute and chronic respiratory failure with hypoxia (HCC) Active Problems:   Sepsis due to pneumonia (HCC)   HCAP (healthcare-associated pneumonia)   Atelectasis, right   Symptomatic anemia   Anoxic encephalopathy (HCC)   Hypoalbuminemia   By systems: PULMONARY  Healthcare-associated pneumonia  Low clinical suspicion for COVID-19  Chronic respiratory failure, status post tracheostomy  Atelectasis, RML, RLL Chest CT L side down Trach aspirate for Gram stain, C/S Cefepime/Flagyl/vancomycin started in ER.  Follow up culture results.  INFECTIOUS  Sepsis/HCAP  Low clinical suspicion for COVID-19 SARS-CoV-2 test pending.  CARDIOVASCULAR  Tachycardia   RENAL  No acute issues Monitor U/O   NEUROLOGIC  Anoxic encephalopathy  HEMATOLOGIC  Symptomatic anemia  DVT PROPHYLAXIS: heparin Type and screen Transfuse 1 unit PRBC Hemoccult/Gastroccult  GASTROINTESTINAL  Hypoalbuminemia  PEG in situ  GI PROPHYLAXIS: famotidine Clinical nutrition consult Check prealbumin   ENDOCRINE  No acute issues    PLAN/RECOMMENDATIONS   Admit to ICU under my service (Attending: Marcelle Smiling, MD) with the diagnoses highlighted above in the active Hospital Problem List (ASSESSMENT).  NUTRITION: Clinical nutrition consult for tube feed recommendations    My assessment, plan of care, findings, medications, side effects, etc. were discussed with: nurse.   Best practice:  Diet: TF Pain/Anxiety/Delirium protocol (if indicated): not indicated VAP protocol (if indicated): Yes DVT prophylaxis: heparin GI prophylaxis: famotidine Glucose control: not indicated Mobility/Activity: Bedrest CODE  STATUS:   Code Status: Not on file Family Communication:  no family at the bedside Disposition: admit to ICU   Labs   CBC: Recent Labs  Lab 10/21/18 1837  WBC 22.7*  NEUTROABS 18.3*  HGB 6.7*  HCT 27.4*  MCV 77.8*  PLT 617*    Basic Metabolic Panel: Recent Labs  Lab 10/21/18 1837  NA 140  K 4.1  CL 96*  CO2 35*  GLUCOSE 311*  BUN 17  CREATININE 0.51  CALCIUM 9.2   GFR: CrCl cannot be calculated (Unknown ideal weight.). Recent Labs  Lab 10/21/18 1832 10/21/18 1837  WBC  --  22.7*  LATICACIDVEN 2.5*  --     Liver Function Tests: Recent Labs  Lab 10/21/18 1837  AST 20  ALT 15  ALKPHOS 179*  BILITOT 0.3  PROT 9.9*  ALBUMIN 1.7*   No results for input(s): LIPASE, AMYLASE in the last 168 hours. No results for input(s): AMMONIA in the last 168 hours.  ABG No results found for: PHART, PCO2ART, PO2ART, HCO3, TCO2, ACIDBASEDEF, O2SAT   Coagulation Profile: No results for input(s): INR, PROTIME in the last 168 hours.  Cardiac Enzymes: No results for input(s): CKTOTAL, CKMB, CKMBINDEX, TROPONINI in the last 168 hours.  HbA1C: No results found for: HGBA1C  CBG: No results for input(s): GLUCAP in the last 168 hours.   Past Medical History   Past Medical History:  Diagnosis Date  .  Acute respiratory failure (HCC)   . Anoxic brain damage (HCC)   . Cardiac arrest (HCC)   . Central corneal ulcer, left eye   . Idiopathic hypotension   . Tachyarrhythmia   . Viral hepatitis       Surgical History   No past surgical history on file.    Social History   Social History   Socioeconomic History  . Marital status: Single    Spouse name: Not on file  . Number of children: Not on file  . Years of education: Not on file  . Highest education level: Not on file  Occupational History  . Not on file  Social Needs  . Financial resource strain: Not on file  . Food insecurity:    Worry: Not on file    Inability: Not on file  . Transportation needs:     Medical: Not on file    Non-medical: Not on file  Tobacco Use  . Smoking status: Not on file  Substance and Sexual Activity  . Alcohol use: Not on file  . Drug use: Not on file  . Sexual activity: Not on file  Lifestyle  . Physical activity:    Days per week: Not on file    Minutes per session: Not on file  . Stress: Not on file  Relationships  . Social connections:    Talks on phone: Not on file    Gets together: Not on file    Attends religious service: Not on file    Active member of club or organization: Not on file    Attends meetings of clubs or organizations: Not on file    Relationship status: Not on file  Other Topics Concern  . Not on file  Social History Narrative  . Not on file     Family History   No family history on file. family history is not on file.    Allergies Allergies  Allergen Reactions  . Other     chlorahexidine     Current Medications  Current Facility-Administered Medications:  .  ceFEPIme (MAXIPIME) 2 g in sodium chloride 0.9 % 100 mL IVPB, 2 g, Intravenous, Once, Eber Hong, MD .  lactated ringers bolus 1,000 mL, 1,000 mL, Intravenous, Once **AND** lactated ringers bolus 1,000 mL, 1,000 mL, Intravenous, Once **AND** lactated ringers bolus 500 mL, 500 mL, Intravenous, Once, Eber Hong, MD .  metroNIDAZOLE (FLAGYL) IVPB 500 mg, 500 mg, Intravenous, Once, Eber Hong, MD, Last Rate: 100 mL/hr at 10/21/18 1859, 500 mg at 10/21/18 1859 .  vancomycin (VANCOCIN) IVPB 1000 mg/200 mL premix, 1,000 mg, Intravenous, Once, Eber Hong, MD  Current Outpatient Medications:  .  acyclovir (ZOVIRAX) 800 MG tablet, Take 1 tablet (800 mg total) by mouth 5 (five) times daily., Disp: 35 tablet, Rfl: 0   Home Medications  Prior to Admission medications   Medication Sig Start Date End Date Taking? Authorizing Provider  acyclovir (ZOVIRAX) 800 MG tablet Take 1 tablet (800 mg total) by mouth 5 (five) times daily. 07/07/18   Doug Sou, MD       Critical care time: 90 minutes.  The treatment and management of the patient's condition was required based on the threat of imminent deterioration. This time reflects time spent by the physician evaluating, providing care and managing the critically ill patient's care. The time was spent at the immediate bedside (or on the same floor/unit and dedicated to this patient's care). Time involved in separately billable procedures is NOT included  int he critical care time indicated above. Family meeting and update time may be included above if and only if the patient is unable/incompetent to participate in clinical interview and/or decision making, and the discussion was necessary to determining treatment decisions.   Marcelle Smiling, MD Board Certified by the ABIM, Pulmonary Diseases & Critical Care Medicine  [facility] Pulmonary/Critical Care Critical Care Pager: [phone]

## 2018-10-21 NOTE — ED Notes (Signed)
Per IV team, they will return to reassess patient picc line at 2230. Stated line flushes easily but cannot get blood return. TPA placed in line by IV team.

## 2018-10-21 NOTE — ED Provider Notes (Signed)
[facility] Provider Note   CSN: [id number] Arrival date & time: 10/21/18  1734    History   Chief Complaint Chief Complaint  Patient presents with  . Fever/Vent/[facility]    HPI Kathy Buckley is a 28 y.o. female.     HPI  28 year old female, unfortunately she has a very sad history of an anoxic brain injury and acute respiratory failure.  She currently has a trach and a PEG tube, [facility] since shortly after delivering a baby and suffering what was reported to be a massive stroke.  The patient is unable to give me any information, a level 5 caveat applies.  Reportedly per EMS the patient has been on antibiotics for what was thought to be pneumonia but continuing to be febrile and tachycardic and thus sent here at the family's request since all of the other options were "exhausted" per the paramedics.  Past Medical History:  Diagnosis Date  . Acute respiratory failure (HCC)   . Anoxic brain damage (HCC)   . Cardiac arrest (HCC)   . Central corneal ulcer, left eye   . Idiopathic hypotension   . Tachyarrhythmia   . Viral hepatitis     There are no active problems to display for this patient.   No past surgical history on file.   OB History   No obstetric history on file.      Home Medications    Prior to Admission medications   Medication Sig Start Date End Date Taking? Authorizing Provider  acyclovir (ZOVIRAX) 800 MG tablet Take 1 tablet (800 mg total) by mouth 5 (five) times daily. 07/07/18   Doug SouJacubowitz, Sam, MD    Family History No family history on file.  Social History Social History   Tobacco Use  . Smoking status: Not on file  Substance Use Topics  . Alcohol use: Not on file  . Drug use: Not on file     Allergies   Other   Review of Systems Review of Systems  Unable to perform ROS: Patient unresponsive     Physical Exam Updated Vital Signs BP 108/85  (BP Location: Left Arm)   Pulse (!) 110   Temp (!) 101 F (38.3 C) (Rectal)   Resp 20   SpO2 100%   Physical Exam Vitals signs and nursing note reviewed.  Constitutional:      General: She is in acute distress.     Appearance: She is well-developed. She is toxic-appearing.     Comments: Totally unresponsive  HENT:     Head: Normocephalic and atraumatic.     Mouth/Throat:     Pharynx: No oropharyngeal exudate.  Eyes:     General: No scleral icterus.       Right eye: No discharge.        Left eye: No discharge.     Comments: No corneal reflex  Neck:     Musculoskeletal: Normal range of motion and neck supple.     Thyroid: No thyromegaly.     Vascular: No JVD.  Cardiovascular:     Rate and Rhythm: Regular rhythm. Tachycardia present.     Heart sounds: Normal heart sounds. No murmur. No friction rub. No gallop.   Pulmonary:     Effort: Pulmonary effort is normal. No respiratory distress.     Breath sounds: Rales present. No wheezing.     Comments: On the vent, rales bilaterally Abdominal:  General: Bowel sounds are normal. There is distension.     Comments: PEG tube in place, distended  Musculoskeletal:        General: No deformity.     Right lower leg: No edema.     Left lower leg: No edema.  Lymphadenopathy:     Cervical: No cervical adenopathy.  Skin:    General: Skin is warm and dry.     Findings: No erythema or rash.  Neurological:     Comments: Unresponsive to painful stimuli, GCS of 3     ED Treatments / Results  Labs (all labs ordered are listed, but only abnormal results are displayed) Labs Reviewed - No data to display  EKG None  Radiology No results found.  Procedures .Critical Care Performed by: Eber Hong, MD Authorized by: Eber Hong, MD   Critical care provider statement:    Critical care time (minutes):  35   Critical care time was exclusive of:  Separately billable procedures and treating other patients and teaching time    Critical care was necessary to treat or prevent imminent or life-threatening deterioration of the following conditions:  Sepsis   Critical care was time spent personally by me on the following activities:  Blood draw for specimens, development of treatment plan with patient or surrogate, discussions with consultants, evaluation of patient's response to treatment, examination of patient, obtaining history from patient or surrogate, ordering and performing treatments and interventions, ordering and review of laboratory studies, ordering and review of radiographic studies, pulse oximetry, re-evaluation of patient's condition and review of old charts   (including critical care time)  Medications Ordered in ED Medications - No data to display   Initial Impression / Assessment and Plan / ED Course  I have reviewed the triage vital signs and the nursing notes.  Pertinent labs & imaging results that were available during my care of the patient were reviewed by me and considered in my medical decision making (see chart for details).  Clinical Course as of Oct 21 2022  Sun Oct 21, 2018  1902 X-ray is consistent with significant pneumonia.   [BM]  1914 Labs reviewed, my interpretation is that there is severe anemia, she has a severe leukocytosis of almost 23,000 as well confirming along with the chest x-ray findings that this is most likely going to be pneumonia and sepsis, will discuss with critical care, paged at 7:15 PM   [BM]  1947 Cussed with Dr. Darrick Penna around 7:30 PM, the patient will be seen by critical care for possible admission.  Metabolic panel shows that the patient has thankfully preserved kidney function but is otherwise fairly severely ill and becoming more hypotensive.  IV fluids have been added   [BM]    Clinical Course User Index [BM] Eber Hong, MD      The patient is ill-appearing, she is febrile, tachycardic and septic appearing, she has a low blood pressure but does not  have a map below 60.  Will activate code sepsis and will likely need to admit to the ICU.  She has no known source of infection but lungs sound like a possible source and a likely source given her chronic vent.  Unfortunately she has no brain activity and the family will need to be informed.  Critically ill.  ICU will admit   Final Clinical Impressions(s) / ED Diagnoses   Final diagnoses:  Severe sepsis (HCC)  Severe anemia      Eber Hong, MD 10/21/18 2024

## 2018-10-21 NOTE — Progress Notes (Signed)
Patient came from [facility] with a 6.0 XLT Shiley trach, placed on above vent settings per her home settings.

## 2018-10-21 NOTE — Progress Notes (Signed)
Pharmacy Antibiotic Note  Kathy Buckley is a 28 y.o. female admitted on 10/21/2018 with sepsis.  Pharmacy has been consulted for vancomycin and cefepime dosing. T 101, tachy, LA 2.5, WBC 22.7 SCr 0.51  Plan: Vancomycin 1500mg  IV x 1, then 750mg  IV every 8 hours (calc AUC 447, SCr 0.51) Cefepime 2g IV every 8 hours Monitor renal function, Cx and clinical progression to narrow Vancomycin levels at steady state    Temp (24hrs), Avg:101 F (38.3 C), Min:101 F (38.3 C), Max:101 F (38.3 C)  No results for input(s): WBC, CREATININE, LATICACIDVEN, VANCOTROUGH, VANCOPEAK, VANCORANDOM, GENTTROUGH, GENTPEAK, GENTRANDOM, TOBRATROUGH, TOBRAPEAK, TOBRARND, AMIKACINPEAK, AMIKACINTROU, AMIKACIN in the last 168 hours.  CrCl cannot be calculated (No successful lab value found.).    Allergies  Allergen Reactions  . Other     chlorahexidine    Antimicrobials this admission: Vanc 4/19>> Cefepime 4/19>> Flagyl x1  Dose adjustments this admission: n/a  Microbiology results: 4/19 BCx: sent  Daylene Posey, PharmD Clinical Pharmacist Please check AMION for all [facility] Pharmacy numbers 10/21/2018 6:09 PM

## 2018-10-22 ENCOUNTER — Inpatient Hospital Stay (HOSPITAL_COMMUNITY): Payer: Medicaid Other

## 2018-10-22 LAB — BASIC METABOLIC PANEL
Anion gap: 6 (ref 5–15)
BUN: 11 mg/dL (ref 6–20)
CO2: 39 mmol/L — ABNORMAL HIGH (ref 22–32)
Calcium: 9.3 mg/dL (ref 8.9–10.3)
Chloride: 98 mmol/L (ref 98–111)
Creatinine, Ser: 0.4 mg/dL — ABNORMAL LOW (ref 0.44–1.00)
GFR calc Af Amer: >60 mL/min (ref >60)
GFR calc non Af Amer: >60 mL/min (ref >60)
Glucose, Bld: 205 mg/dL — ABNORMAL HIGH (ref 70–99)
Potassium: 3.2 mmol/L — ABNORMAL LOW (ref 3.5–5.1)
Sodium: 143 mmol/L (ref 135–145)

## 2018-10-22 LAB — PHOSPHORUS: Phosphorus: 1.9 mg/dL — ABNORMAL LOW (ref 2.5–4.6)

## 2018-10-22 LAB — POCT I-STAT 7, (LYTES, BLD GAS, ICA,H+H)
Acid-Base Excess: 20 mmol/L — ABNORMAL HIGH (ref 0.0–2.0)
Bicarbonate: 44 mmol/L — ABNORMAL HIGH (ref 20.0–28.0)
Calcium, Ion: 1.27 mmol/L (ref 1.15–1.40)
HCT: 33 % — ABNORMAL LOW (ref 36.0–46.0)
Hemoglobin: 11.2 g/dL — ABNORMAL LOW (ref 12.0–15.0)
O2 Saturation: 97 %
Patient temperature: 98.4
Potassium: 3.1 mmol/L — ABNORMAL LOW (ref 3.5–5.1)
Sodium: 145 mmol/L (ref 135–145)
TCO2: 45 mmol/L — ABNORMAL HIGH (ref 22–32)
pCO2 arterial: 45.8 mmHg (ref 32.0–48.0)
pH, Arterial: 7.59 — ABNORMAL HIGH (ref 7.350–7.450)
pO2, Arterial: 75 mmHg — ABNORMAL LOW (ref 83.0–108.0)

## 2018-10-22 LAB — PROCALCITONIN: Procalcitonin: 2.02 ng/mL

## 2018-10-22 LAB — HIV ANTIBODY (ROUTINE TESTING W REFLEX): HIV Screen 4th Generation wRfx: NONREACTIVE

## 2018-10-22 LAB — CBC WITH DIFFERENTIAL/PLATELET
Abs Immature Granulocytes: 0 10*3/uL (ref 0.00–0.07)
Basophils Absolute: 0.2 10*3/uL — ABNORMAL HIGH (ref 0.0–0.1)
Basophils Relative: 1 %
Eosinophils Absolute: 0.2 10*3/uL (ref 0.0–0.5)
Eosinophils Relative: 1 %
HCT: 28.2 % — ABNORMAL LOW (ref 36.0–46.0)
Hemoglobin: 7.9 g/dL — ABNORMAL LOW (ref 12.0–15.0)
Lymphocytes Relative: 6 %
Lymphs Abs: 1.2 10*3/uL (ref 0.7–4.0)
MCH: 21.9 pg — ABNORMAL LOW (ref 26.0–34.0)
MCHC: 28 g/dL — ABNORMAL LOW (ref 30.0–36.0)
MCV: 78.1 fL — ABNORMAL LOW (ref 80.0–100.0)
Monocytes Absolute: 0.6 10*3/uL (ref 0.1–1.0)
Monocytes Relative: 3 %
Neutro Abs: 17.7 10*3/uL — ABNORMAL HIGH (ref 1.7–7.7)
Neutrophils Relative %: 89 %
Platelets: 564 10*3/uL — ABNORMAL HIGH (ref 150–400)
RBC: 3.61 MIL/uL — ABNORMAL LOW (ref 3.87–5.11)
RDW: 23.4 % — ABNORMAL HIGH (ref 11.5–15.5)
WBC: 19.9 10*3/uL — ABNORMAL HIGH (ref 4.0–10.5)
nRBC: 0 /100 WBC
nRBC: 0.7 % — ABNORMAL HIGH (ref 0.0–0.2)

## 2018-10-22 LAB — TYPE AND SCREEN
ABO/RH(D): O POS
Antibody Screen: NEGATIVE
Unit division: 0

## 2018-10-22 LAB — GLUCOSE, CAPILLARY
Glucose-Capillary: 142 mg/dL — ABNORMAL HIGH (ref 70–99)
Glucose-Capillary: 176 mg/dL — ABNORMAL HIGH (ref 70–99)
Glucose-Capillary: 181 mg/dL — ABNORMAL HIGH (ref 70–99)
Glucose-Capillary: 193 mg/dL — ABNORMAL HIGH (ref 70–99)
Glucose-Capillary: 211 mg/dL — ABNORMAL HIGH (ref 70–99)

## 2018-10-22 LAB — BPAM RBC
Blood Product Expiration Date: 202004262359
ISSUE DATE / TIME: 202004192313
Unit Type and Rh: 5100

## 2018-10-22 LAB — MAGNESIUM: Magnesium: 1.7 mg/dL (ref 1.7–2.4)

## 2018-10-22 LAB — SARS CORONAVIRUS 2 BY RT PCR (HOSPITAL ORDER, PERFORMED IN ~~LOC~~ HOSPITAL LAB): SARS Coronavirus 2: NEGATIVE

## 2018-10-22 LAB — MRSA PCR SCREENING

## 2018-10-22 LAB — LACTIC ACID, PLASMA: Lactic Acid, Venous: 1.9 mmol/L (ref 0.5–1.9)

## 2018-10-22 MED ORDER — SODIUM CHLORIDE 0.9 % IV BOLUS
500.0000 mL | Freq: Once | INTRAVENOUS | Status: AC
  Administered 2018-10-22: 500 mL via INTRAVENOUS

## 2018-10-22 MED ORDER — POTASSIUM CHLORIDE 20 MEQ/15ML (10%) PO SOLN
40.0000 meq | Freq: Once | ORAL | Status: AC
  Administered 2018-10-22: 40 meq
  Filled 2018-10-22: qty 30

## 2018-10-22 MED ORDER — INSULIN ASPART 100 UNIT/ML ~~LOC~~ SOLN
2.0000 [IU] | SUBCUTANEOUS | Status: DC
  Administered 2018-10-22: 4 [IU] via SUBCUTANEOUS
  Administered 2018-10-22: 2 [IU] via SUBCUTANEOUS

## 2018-10-22 MED ORDER — POTASSIUM PHOSPHATES 15 MMOLE/5ML IV SOLN
30.0000 mmol | Freq: Once | INTRAVENOUS | Status: AC
  Administered 2018-10-22: 10:00:00 30 mmol via INTRAVENOUS
  Filled 2018-10-22: qty 10

## 2018-10-22 NOTE — Progress Notes (Signed)
[facility] here to transport pt back to [facility].  Report and paperwork given. Spoke w/ Linde Gillis, RN [facility] to update that pt is on her way.

## 2018-10-22 NOTE — TOC Transition Note (Addendum)
Transition of Care [facility]) - CM/SW Discharge Note   Patient Details  Name: Kathy Buckley MRN: [id number] Date of Birth: [birth year]  [facility]) CM/SW Contact:  Cristobal Goldmann, LCSW Phone Number: 10/22/2018, 4:49 PM   Clinical Narrative:  CSW talked with Francisco Capuchin at [facility] regarding patient and advised her of patient's readiness to return. Discharge summary faxed to facility and transport arranged via [facility]. Per Misty Stanley, an FL-2 was not needed. Patient's mother, Marena Buckhannon [phone]) called and informed that transport had been arranged.   Final next level of care: Skilled Nursing Facility([facility]) Barriers to Discharge: No Barriers Identified(Per MD, patient ready for discharge today)   Patient Goals and CMS Choice Patient states their goals for this hospitalization and ongoing recovery are:: Mother had questions about discharge and wants to assure patient is medically stable to return to [facility].gov Compare Post Acute Care list provided to:: Other (Comment Required)(Not needed as patient from [facility] and is returning there) Choice offered to / list presented to : NA  Discharge Placement  Back to [facility]                    Discharge Plan and Services In-house Referral: Clinical Social Work Discharge Planning Services: NA            DME Arranged: N/A DME Agency: NA   HH Agency: NA   Social Determinants of Health (SDOH) Interventions  No interventions needed at discharge.   Readmission Risk Interventions No flowsheet data found.

## 2018-10-22 NOTE — Progress Notes (Signed)
NAME:  Kathy Buckley MRN:  [id number] DOB:  March 11, 1991 LOS: 1 ADMISSION DATE:  10/21/2018 DATE OF SERVICE:  10/21/2018  CHIEF COMPLAINT:  Fever, tachycardia    History of Present Illness  28 yo female from [facility] with fever and tachycardia.  Was being tx for pneumonia at [facility].  Reported to have multidrug resistant infections.  She had post partum hemorrhage with ischemic stroke with anoxic encephalopathy in April 2019.  Past Medical/Surgical/Social/Family History  Ischemic CVA, Anoxic encephalopathy, Systolic CHF  Procedures:    Significant Diagnostic Tests:  CT head (10/26/2017): patchy hypodensities in the R subinsular region and the parafalcine L frontal lobe concerning for acute infarcts.  Possible sequela of global hypoxic/ischemic injury. 2D-echo (10/27/2017): LVEF 35-40% with septal and inferior wall hypokinesis.  Micro Data:  COVID 4/19 >> Not detected  Blood 4/19 >>  Sputum 4/19 >>  Antimicrobials:  Zosyn/cefepime/TOBI (@ [facility]) Cefepime 4/19 >>  Flagyl 4/19 >> Vancomycin 4/19 >>  Interim history/subjective:  No further fever.  HR improved.  Objective   BP 92/67   Pulse 99   Temp 97.6 F (36.4 C) (Axillary)   Resp 19   Ht 5\' 5"  (1.651 m)   Wt 87 kg   SpO2 100%   BMI 31.92 kg/m     Filed Weights   10/21/18 2000 10/21/18 2245 10/22/18 0323  Weight: 77.1 kg 87.3 kg 87 kg    Intake/Output Summary (Last 24 hours) at 10/22/2018 0916 Last data filed at 10/22/2018 0900 Gross per 24 hour  Intake 756.06 ml  Output 2045 ml  Net -1288.94 ml    Vent Mode: PRVC FiO2 (%):  [60 %-90 %] 60 % Set Rate:  [20 bmp] 20 bmp Vt Set:  [450 mL-500 mL] 450 mL PEEP:  [10 cmH20] 10 cmH20 Plateau Pressure:  [29 cmH20-38 cmH20] 38 cmH20   Examination:  General - unresponsive Eyes - pupils midpoint ENT - trach site clean Cardiac - regular, no murmur Chest - scattered rhonchi Abdomen - soft, non tender, G tube in place Extremities - 1+ edema Skin - no rashes  Neuro - doesn't follow commands  CXR - b/l ASD (reviewed by me)   Assessment & Plan:   HCAP. Plan - continue ABx - COVID ruled out  Fever, tachycardia resolved.  Chronic respiratory failure. Tracheostomy status. Plan - change respiratory rate to 16 - SpO2 goal 90 to 95%  Anoxic encephalopathy. Plan - poor long term prognosis  Hypokalemia, hypophosphatemia. Plan - replace as needed  Anemia of critical illness. Plan - f/u CBC intermittently  Hyperglycemia. Plan - SSI  Best practice:  Diet: tube feeds DVT prophylaxis: lovenox GI prophylaxis: famotidine Mobility/Activity: Bedrest Code status: full code Disposition: can transfer back to [facility] when bed available  Labs    CMP Latest Ref Rng & Units 10/22/2018 10/22/2018 10/21/2018  Glucose 70 - 99 mg/dL - 121(F) 758(I)  BUN 6 - 20 mg/dL - 11 17  Creatinine 3.25 - 1.00 mg/dL - 4.98(Y) 6.41  Sodium 135 - 145 mmol/L 145 143 140  Potassium 3.5 - 5.1 mmol/L 3.1(L) 3.2(L) 4.1  Chloride 98 - 111 mmol/L - 98 96(L)  CO2 22 - 32 mmol/L - 39(H) 35(H)  Calcium 8.9 - 10.3 mg/dL - 9.3 9.2  Total Protein 6.5 - 8.1 g/dL - - 9.9(H)  Total Bilirubin 0.3 - 1.2 mg/dL - - 0.3  Alkaline Phos 38 - 126 U/L - - 179(H)  AST 15 - 41 U/L - - 20  ALT 0 -  44 U/L - - 15   CBC Latest Ref Rng & Units 10/22/2018 10/22/2018 10/21/2018  WBC 4.0 - 10.5 K/uL - 19.9(H) 22.7(H)  Hemoglobin 12.0 - 15.0 g/dL 11.2(L) 7.9(L) 6.7(LL)  Hematocrit 36.0 - 46.0 % 33.0(L) 28.2(L) 27.4(L)  Platelets 150 - 400 K/uL - 564(H) 617(H)   CBG (last 3)  Recent Labs    10/21/18 2235 10/22/18 0051 10/22/18 0319  GLUCAP 235* 211* 193*   ABG    Component Value Date/Time   PHART 7.590 (H) 10/22/2018 0502   PCO2ART 45.8 10/22/2018 0502   PO2ART 75.0 (L) 10/22/2018 0502   HCO3 44.0 (H) 10/22/2018 0502   TCO2 45 (H) 10/22/2018 0502   O2SAT 97.0 10/22/2018 0502   [name], MD [facility] Pulmonary/Critical Care 10/22/2018, 9:38 AM

## 2018-10-22 NOTE — Progress Notes (Signed)
Report given to Linde Gillis, RN @[facility] . Pt to be transported back to [facility] by [employer]

## 2018-10-22 NOTE — Progress Notes (Signed)
Per Dr. Craige Cotta, pt may transfer back to [facility]. Left vm for Lauretta Grill w/ Case Management to relay.

## 2018-10-22 NOTE — TOC Initial Note (Signed)
Transition of Care [facility]) - Initial/Assessment Note    Patient Details  Name: Kathy Buckley MRN: [id number] Date of Birth: [birth year]  [facility]) CM/SW Contact:    Cristobal Goldmann, LCSW Phone Number: 10/22/2018, 1:05 PM  Clinical Narrative:  CSW talked with patient's mother, Hayla Prigge ([phone]) by phone regarding her daughter's discharge disposition. Mother confirmed that her daughter is from [facility] and has been there for a year. Mrs. Winkelmann shared that she talked to the ED doctor yesterday and her nurse this morning, but is concerned that she is discharging so quickly. When asked, mother did want to speak with attending MD.                  Expected Discharge Plan: Skilled Nursing Facility(Back to [facility]) Barriers to Discharge: No Barriers Identified(Per MD, patient ready for discharge today)   Patient Goals and CMS Choice Patient states their goals for this hospitalization and ongoing recovery are:: Mother had questions about discharge and wants to assure patient is medically stable to return to [email or web address].gov Compare Post Acute Care list provided to:: Other (Comment Required)(Not needed as patient from [facility] and is returning there) Choice offered to / list presented to : NA  Expected Discharge Plan and Services Expected Discharge Plan: Skilled Nursing Facility(Back to [facility]) In-house Referral: Clinical Social Work Discharge Planning Services: NA   Living arrangements for the past 2 months: Skilled Nursing Facility([facility]) Expected Discharge Date: 10/22/18               DME Arranged: N/A DME Agency: NA   HH Agency: NA  Prior Living Arrangements/Services Living arrangements for the past 2 months: Skilled Nursing Facility([facility]) Lives with:: Facility Resident Patient language and need for interpreter reviewed:: No Do you feel safe going back to the place where you live?: Yes(Mother satisfied  with care at [facility], but wants to assure her daughter is medically stable for d/cdf)      Need for Family Participation in Patient Care: Yes (Comment) Care giver support system in place?: Yes (comment) Current home services: Other (comment)(n/a) Criminal Activity/Legal Involvement Pertinent to Current Situation/Hospitalization: No - Comment as needed  Activities of Daily Living  n/a    Permission Sought/Granted Permission sought to share information with : Other (comment)(Patient unable to give consent) Permission granted to share information with : No(Patient unable to give consent)              Emotional Assessment Appearance:: Other (Comment Required(Did not visit with patient, talked to mother by phone) Attitude/Demeanor/Rapport: Unable to Assess Affect (typically observed): Unable to Assess Orientation: : Fluctuating Orientation (Suspected and/or reported Sundowners)(Patient unresponsive) Alcohol / Substance Use: Not Applicable Psych Involvement: No (comment)  Admission diagnosis:  Severe sepsis (HCC) [A41.9, R65.20] Severe anemia [D64.9] Encounter for assessment of peripherally inserted central catheter (PICC) [Z45.2] Patient Active Problem List   Diagnosis Date Noted  . Sepsis due to pneumonia (HCC) 10/21/2018  . HCAP (healthcare-associated pneumonia) 10/21/2018  . Acute and chronic respiratory failure with hypoxia (HCC) 10/21/2018  . Atelectasis, right 10/21/2018  . Anoxic encephalopathy (HCC) 10/21/2018  . Symptomatic anemia 10/21/2018  . Hypoalbuminemia 10/21/2018   PCP:  Eloisa Northern, MD Pharmacy:  No Pharmacies Listed    Social Determinants of Health (SDOH) Interventions No interventions needed at this time.    Readmission Risk Interventions No flowsheet data found.

## 2018-10-22 NOTE — Discharge Summary (Addendum)
NAME:  Kathy Buckley MRN:  [id number] DOB:  [birth year] LOS: 1 ADMISSION DATE:  10/21/2018 DISCHARGE DATE: 10/22/2018  CHIEF COMPLAINT:  Fever, tachycardia   History of Present Illness  28 yo female from [facility] with fever and tachycardia.  Was being tx for pneumonia at [facility].  Reported to have multidrug resistant infections.  She had post partum hemorrhage with ischemic stroke with anoxic encephalopathy in April 2019.  Past Medical History  Ischemic CVA, Anoxic encephalopathy, Systolic CHF  Micro Data:  COVID 4/19 >> Not detected  Blood 4/19 >>  Sputum 4/19 >>  Antimicrobials:  Cefepime 4/19 >>  Flagyl 4/19 >> Vancomycin 4/19 >>  Interim history/subjective:  No further fever.  HR improved.   Medications   .  insulin aspart (novoLOG) injection 2-6 Units .  albuterol (PROVENTIL) (2.5 MG/3ML) 0.083% nebulizer solution 2.5 mg .  enoxaparin (LOVENOX) injection 40 mg  .  ceFEPIme (MAXIPIME) 2 g in sodium chloride 0.9 % 100 mL IVPB .  famotidine (PEPCID) 40 MG/5ML suspension 20 mg .  MEDLINE mouth rinse .  potassium PHOSPHATE 30 mmol in dextrose 5 % 500 mL infusion .  sodium chloride 0.9 % bolus 500 mL .  sodium chloride flush (NS) 0.9 % injection 10-40 mL .  vancomycin (VANCOCIN) IVPB 750 mg/150 ml premix   Objective   BP 96/72   Pulse (!) 103   Temp (!) 97.3 F (36.3 C) (Axillary)   Resp 16   Ht 5\' 5"  (1.651 m)   Wt 87 kg   SpO2 100%   BMI 31.92 kg/m     Filed Weights   10/21/18 2000 10/21/18 2245 10/22/18 0323  Weight: 77.1 kg 87.3 kg 87 kg    Intake/Output Summary (Last 24 hours) at 10/22/2018 1236 Last data filed at 10/22/2018 1200 Gross per 24 hour  Intake 943.72 ml  Output 2095 ml  Net -1151.28 ml    Vent Mode: PRVC FiO2 (%):  [30 %-90 %] 30 % Set Rate:  [16 bmp-20 bmp] 16 bmp Vt Set:  [450 mL-500 mL] 480 mL PEEP:  [5 cmH20-10 cmH20] 5 cmH20 Plateau Pressure:  [29 cmH20-38 cmH20] 37 cmH20   Examination:  General - unresponsive Eyes -  pupils midpoint ENT - trach site clean Cardiac - regular, no murmur Chest - scattered rhonchi Abdomen - soft, non tender, G tube in place Extremities - 1+ edema Skin - no rashes Neuro - doesn't follow commands  CXR - b/l ASD   Assessment & Plan:   HCAP. Plan - continue ABx - COVID ruled out  Fever, tachycardia resolved.  Chronic respiratory failure. Tracheostomy status. Plan - change respiratory rate to 16 - SpO2 goal 90 to 95%  Anoxic encephalopathy. Plan - poor long term prognosis  Hypokalemia, hypophosphatemia. Plan - replace as needed  Anemia of critical illness. Plan - f/u CBC intermittently  Hyperglycemia. Plan - SSI  Best practice:  Diet: tube feeds DVT prophylaxis: lovenox GI prophylaxis: famotidine Mobility/Activity: Bedrest Code status: full code [facility]   Labs    CMP Latest Ref Rng & Units 10/22/2018 10/22/2018 10/21/2018  Glucose 70 - 99 mg/dL - 295(M205(H) 841(L311(H)  BUN 6 - 20 mg/dL - 11 17  Creatinine 2.440.44 - 1.00 mg/dL - 0.10(U0.40(L) 7.250.51  Sodium 135 - 145 mmol/L 145 143 140  Potassium 3.5 - 5.1 mmol/L 3.1(L) 3.2(L) 4.1  Chloride 98 - 111 mmol/L - 98 96(L)  CO2 22 - 32 mmol/L - 39(H) 35(H)  Calcium 8.9 -  10.3 mg/dL - 9.3 9.2  Total Protein 6.5 - 8.1 g/dL - - 9.9(H)  Total Bilirubin 0.3 - 1.2 mg/dL - - 0.3  Alkaline Phos 38 - 126 U/L - - 179(H)  AST 15 - 41 U/L - - 20  ALT 0 - 44 U/L - - 15   CBC Latest Ref Rng & Units 10/22/2018 10/22/2018 10/21/2018  WBC 4.0 - 10.5 K/uL - 19.9(H) 22.7(H)  Hemoglobin 12.0 - 15.0 g/dL 11.2(L) 7.9(L) 6.7(LL)  Hematocrit 36.0 - 46.0 % 33.0(L) 28.2(L) 27.4(L)  Platelets 150 - 400 K/uL - 564(H) 617(H)   CBG (last 3)  Recent Labs    10/22/18 0319 10/22/18 0801 10/22/18 1144  GLUCAP 193* 181* 142*   ABG    Component Value Date/Time   PHART 7.590 (H) 10/22/2018 0502   PCO2ART 45.8 10/22/2018 0502   PO2ART 75.0 (L) 10/22/2018 0502   HCO3 44.0 (H) 10/22/2018 0502   TCO2 45 (H)  10/22/2018 0502   O2SAT 97.0 10/22/2018 0502   Discharge planning 37 minutes.  Coralyn Helling, MD [facility] Pulmonary/Critical Care 10/22/2018, 12:36 PM

## 2018-10-22 NOTE — Care Management (Signed)
Pt is from [facility] not [facility].  Pt does not have LTACH benefits.  CSW will facilitate pts return to SNF.

## 2018-10-22 NOTE — Progress Notes (Signed)
Spoke w/ pts mom to provide updates on pt.  Relayed that pts COVID test came back negative and that plan is to transfer pt back to [facility].  Pts mom appreciative.

## 2018-10-22 NOTE — Progress Notes (Signed)
I spoke with patient's mother and updated about plan to transfer back to [facility].  Coralyn Helling, MD [facility] Pulmonary/Critical Care 10/22/2018, 1:03 PM

## 2018-10-25 ENCOUNTER — Emergency Department (HOSPITAL_COMMUNITY): Payer: Medicaid Other

## 2018-10-25 ENCOUNTER — Other Ambulatory Visit: Payer: Self-pay

## 2018-10-25 ENCOUNTER — Encounter (HOSPITAL_COMMUNITY): Payer: Self-pay | Admitting: Emergency Medicine

## 2018-10-25 ENCOUNTER — Inpatient Hospital Stay (HOSPITAL_COMMUNITY)
Admission: EM | Admit: 2018-10-25 | Discharge: 2018-12-03 | DRG: 870 | Disposition: E | Payer: Medicaid Other | Source: Other Acute Inpatient Hospital | Attending: Pulmonary Disease | Admitting: Pulmonary Disease

## 2018-10-25 DIAGNOSIS — R509 Fever, unspecified: Secondary | ICD-10-CM | POA: Diagnosis not present

## 2018-10-25 DIAGNOSIS — Z8249 Family history of ischemic heart disease and other diseases of the circulatory system: Secondary | ICD-10-CM

## 2018-10-25 DIAGNOSIS — I4891 Unspecified atrial fibrillation: Secondary | ICD-10-CM | POA: Diagnosis not present

## 2018-10-25 DIAGNOSIS — Z930 Tracheostomy status: Secondary | ICD-10-CM | POA: Diagnosis not present

## 2018-10-25 DIAGNOSIS — Z66 Do not resuscitate: Secondary | ICD-10-CM | POA: Diagnosis not present

## 2018-10-25 DIAGNOSIS — Z978 Presence of other specified devices: Secondary | ICD-10-CM

## 2018-10-25 DIAGNOSIS — Z87898 Personal history of other specified conditions: Secondary | ICD-10-CM | POA: Diagnosis not present

## 2018-10-25 DIAGNOSIS — R402112 Coma scale, eyes open, never, at arrival to emergency department: Secondary | ICD-10-CM | POA: Diagnosis present

## 2018-10-25 DIAGNOSIS — Z1612 Extended spectrum beta lactamase (ESBL) resistance: Secondary | ICD-10-CM | POA: Diagnosis present

## 2018-10-25 DIAGNOSIS — Y848 Other medical procedures as the cause of abnormal reaction of the patient, or of later complication, without mention of misadventure at the time of the procedure: Secondary | ICD-10-CM | POA: Diagnosis present

## 2018-10-25 DIAGNOSIS — Z9911 Dependence on respirator [ventilator] status: Secondary | ICD-10-CM | POA: Diagnosis not present

## 2018-10-25 DIAGNOSIS — J189 Pneumonia, unspecified organism: Secondary | ICD-10-CM | POA: Diagnosis not present

## 2018-10-25 DIAGNOSIS — J9610 Chronic respiratory failure, unspecified whether with hypoxia or hypercapnia: Secondary | ICD-10-CM | POA: Diagnosis not present

## 2018-10-25 DIAGNOSIS — R140 Abdominal distension (gaseous): Secondary | ICD-10-CM | POA: Diagnosis not present

## 2018-10-25 DIAGNOSIS — R402312 Coma scale, best motor response, none, at arrival to emergency department: Secondary | ICD-10-CM | POA: Diagnosis present

## 2018-10-25 DIAGNOSIS — J80 Acute respiratory distress syndrome: Secondary | ICD-10-CM | POA: Diagnosis present

## 2018-10-25 DIAGNOSIS — Z7189 Other specified counseling: Secondary | ICD-10-CM | POA: Diagnosis not present

## 2018-10-25 DIAGNOSIS — Z515 Encounter for palliative care: Secondary | ICD-10-CM | POA: Diagnosis not present

## 2018-10-25 DIAGNOSIS — Y95 Nosocomial condition: Secondary | ICD-10-CM | POA: Diagnosis present

## 2018-10-25 DIAGNOSIS — Z8701 Personal history of pneumonia (recurrent): Secondary | ICD-10-CM

## 2018-10-25 DIAGNOSIS — Z1624 Resistance to multiple antibiotics: Secondary | ICD-10-CM | POA: Diagnosis present

## 2018-10-25 DIAGNOSIS — D6489 Other specified anemias: Secondary | ICD-10-CM | POA: Diagnosis present

## 2018-10-25 DIAGNOSIS — G40901 Epilepsy, unspecified, not intractable, with status epilepticus: Secondary | ICD-10-CM | POA: Diagnosis not present

## 2018-10-25 DIAGNOSIS — J9382 Other air leak: Secondary | ICD-10-CM | POA: Diagnosis not present

## 2018-10-25 DIAGNOSIS — A419 Sepsis, unspecified organism: Secondary | ICD-10-CM | POA: Diagnosis present

## 2018-10-25 DIAGNOSIS — J159 Unspecified bacterial pneumonia: Secondary | ICD-10-CM | POA: Diagnosis present

## 2018-10-25 DIAGNOSIS — Z888 Allergy status to other drugs, medicaments and biological substances status: Secondary | ICD-10-CM

## 2018-10-25 DIAGNOSIS — E874 Mixed disorder of acid-base balance: Secondary | ICD-10-CM | POA: Diagnosis present

## 2018-10-25 DIAGNOSIS — R0603 Acute respiratory distress: Secondary | ICD-10-CM

## 2018-10-25 DIAGNOSIS — G931 Anoxic brain damage, not elsewhere classified: Secondary | ICD-10-CM | POA: Diagnosis not present

## 2018-10-25 DIAGNOSIS — K567 Ileus, unspecified: Secondary | ICD-10-CM

## 2018-10-25 DIAGNOSIS — E871 Hypo-osmolality and hyponatremia: Secondary | ICD-10-CM | POA: Diagnosis present

## 2018-10-25 DIAGNOSIS — D649 Anemia, unspecified: Secondary | ICD-10-CM | POA: Diagnosis present

## 2018-10-25 DIAGNOSIS — R403 Persistent vegetative state: Secondary | ICD-10-CM | POA: Diagnosis present

## 2018-10-25 DIAGNOSIS — Z20828 Contact with and (suspected) exposure to other viral communicable diseases: Secondary | ICD-10-CM | POA: Diagnosis present

## 2018-10-25 DIAGNOSIS — K148 Other diseases of tongue: Secondary | ICD-10-CM | POA: Diagnosis not present

## 2018-10-25 DIAGNOSIS — E870 Hyperosmolality and hypernatremia: Secondary | ICD-10-CM | POA: Diagnosis present

## 2018-10-25 DIAGNOSIS — E876 Hypokalemia: Secondary | ICD-10-CM | POA: Diagnosis present

## 2018-10-25 DIAGNOSIS — J9601 Acute respiratory failure with hypoxia: Secondary | ICD-10-CM

## 2018-10-25 DIAGNOSIS — Z931 Gastrostomy status: Secondary | ICD-10-CM | POA: Diagnosis not present

## 2018-10-25 DIAGNOSIS — Z8673 Personal history of transient ischemic attack (TIA), and cerebral infarction without residual deficits: Secondary | ICD-10-CM | POA: Diagnosis not present

## 2018-10-25 DIAGNOSIS — R569 Unspecified convulsions: Secondary | ICD-10-CM

## 2018-10-25 DIAGNOSIS — N179 Acute kidney failure, unspecified: Secondary | ICD-10-CM | POA: Diagnosis present

## 2018-10-25 DIAGNOSIS — J9600 Acute respiratory failure, unspecified whether with hypoxia or hypercapnia: Secondary | ICD-10-CM | POA: Diagnosis not present

## 2018-10-25 DIAGNOSIS — R6521 Severe sepsis with septic shock: Secondary | ICD-10-CM | POA: Diagnosis present

## 2018-10-25 DIAGNOSIS — Z91048 Other nonmedicinal substance allergy status: Secondary | ICD-10-CM | POA: Diagnosis not present

## 2018-10-25 DIAGNOSIS — J9622 Acute and chronic respiratory failure with hypercapnia: Secondary | ICD-10-CM | POA: Diagnosis not present

## 2018-10-25 DIAGNOSIS — R000 Tachycardia, unspecified: Secondary | ICD-10-CM | POA: Diagnosis not present

## 2018-10-25 DIAGNOSIS — J95851 Ventilator associated pneumonia: Secondary | ICD-10-CM | POA: Diagnosis present

## 2018-10-25 DIAGNOSIS — R739 Hyperglycemia, unspecified: Secondary | ICD-10-CM | POA: Diagnosis present

## 2018-10-25 DIAGNOSIS — I469 Cardiac arrest, cause unspecified: Secondary | ICD-10-CM | POA: Diagnosis present

## 2018-10-25 DIAGNOSIS — Z8619 Personal history of other infectious and parasitic diseases: Secondary | ICD-10-CM | POA: Diagnosis not present

## 2018-10-25 DIAGNOSIS — R402212 Coma scale, best verbal response, none, at arrival to emergency department: Secondary | ICD-10-CM | POA: Diagnosis present

## 2018-10-25 DIAGNOSIS — Z95828 Presence of other vascular implants and grafts: Secondary | ICD-10-CM | POA: Diagnosis not present

## 2018-10-25 DIAGNOSIS — E8809 Other disorders of plasma-protein metabolism, not elsewhere classified: Secondary | ICD-10-CM | POA: Diagnosis present

## 2018-10-25 DIAGNOSIS — E669 Obesity, unspecified: Secondary | ICD-10-CM | POA: Diagnosis present

## 2018-10-25 DIAGNOSIS — Z8674 Personal history of sudden cardiac arrest: Secondary | ICD-10-CM | POA: Diagnosis not present

## 2018-10-25 DIAGNOSIS — Z9289 Personal history of other medical treatment: Secondary | ICD-10-CM

## 2018-10-25 DIAGNOSIS — E875 Hyperkalemia: Secondary | ICD-10-CM | POA: Diagnosis present

## 2018-10-25 DIAGNOSIS — Z6831 Body mass index (BMI) 31.0-31.9, adult: Secondary | ICD-10-CM

## 2018-10-25 DIAGNOSIS — J9690 Respiratory failure, unspecified, unspecified whether with hypoxia or hypercapnia: Secondary | ICD-10-CM

## 2018-10-25 DIAGNOSIS — R339 Retention of urine, unspecified: Secondary | ICD-10-CM | POA: Diagnosis not present

## 2018-10-25 DIAGNOSIS — B965 Pseudomonas (aeruginosa) (mallei) (pseudomallei) as the cause of diseases classified elsewhere: Secondary | ICD-10-CM | POA: Diagnosis present

## 2018-10-25 DIAGNOSIS — E8770 Fluid overload, unspecified: Secondary | ICD-10-CM | POA: Diagnosis not present

## 2018-10-25 LAB — BASIC METABOLIC PANEL
Anion gap: 11 (ref 5–15)
BUN: 19 mg/dL (ref 6–20)
CO2: 32 mmol/L (ref 22–32)
Calcium: 9.4 mg/dL (ref 8.9–10.3)
Chloride: 100 mmol/L (ref 98–111)
Creatinine, Ser: 0.55 mg/dL (ref 0.44–1.00)
GFR calc Af Amer: >60 mL/min (ref >60)
GFR calc non Af Amer: >60 mL/min (ref >60)
Glucose, Bld: 147 mg/dL — ABNORMAL HIGH (ref 70–99)
Potassium: 3.4 mmol/L — ABNORMAL LOW (ref 3.5–5.1)
Sodium: 143 mmol/L (ref 135–145)

## 2018-10-25 LAB — CBC WITH DIFFERENTIAL/PLATELET
Abs Immature Granulocytes: 0 10*3/uL (ref 0.00–0.07)
Basophils Absolute: 0 10*3/uL (ref 0.0–0.1)
Basophils Relative: 0 %
Eosinophils Absolute: 0.4 10*3/uL (ref 0.0–0.5)
Eosinophils Relative: 2 %
HCT: 30.3 % — ABNORMAL LOW (ref 36.0–46.0)
Hemoglobin: 7.9 g/dL — ABNORMAL LOW (ref 12.0–15.0)
Lymphocytes Relative: 9 %
Lymphs Abs: 1.7 10*3/uL (ref 0.7–4.0)
MCH: 21.5 pg — ABNORMAL LOW (ref 26.0–34.0)
MCHC: 26.1 g/dL — ABNORMAL LOW (ref 30.0–36.0)
MCV: 82.3 fL (ref 80.0–100.0)
Monocytes Absolute: 0.8 10*3/uL (ref 0.1–1.0)
Monocytes Relative: 4 %
Neutro Abs: 16.4 10*3/uL — ABNORMAL HIGH (ref 1.7–7.7)
Neutrophils Relative %: 85 %
Platelets: 476 10*3/uL — ABNORMAL HIGH (ref 150–400)
RBC: 3.68 MIL/uL — ABNORMAL LOW (ref 3.87–5.11)
RDW: 26 % — ABNORMAL HIGH (ref 11.5–15.5)
WBC: 19.3 10*3/uL — ABNORMAL HIGH (ref 4.0–10.5)
nRBC: 0.6 % — ABNORMAL HIGH (ref 0.0–0.2)
nRBC: 1 /100 WBC — ABNORMAL HIGH

## 2018-10-25 LAB — POCT I-STAT 7, (LYTES, BLD GAS, ICA,H+H)
Acid-Base Excess: 12 mmol/L — ABNORMAL HIGH (ref 0.0–2.0)
Bicarbonate: 40.7 mmol/L — ABNORMAL HIGH (ref 20.0–28.0)
Calcium, Ion: 1.34 mmol/L (ref 1.15–1.40)
HCT: 34 % — ABNORMAL LOW (ref 36.0–46.0)
Hemoglobin: 11.6 g/dL — ABNORMAL LOW (ref 12.0–15.0)
O2 Saturation: 100 %
Potassium: 3.4 mmol/L — ABNORMAL LOW (ref 3.5–5.1)
Sodium: 147 mmol/L — ABNORMAL HIGH (ref 135–145)
TCO2: 43 mmol/L — ABNORMAL HIGH (ref 22–32)
pCO2 arterial: 75.4 mmHg (ref 32.0–48.0)
pH, Arterial: 7.34 — ABNORMAL LOW (ref 7.350–7.450)
pO2, Arterial: 244 mmHg — ABNORMAL HIGH (ref 83.0–108.0)

## 2018-10-25 LAB — CULTURE, RESPIRATORY W GRAM STAIN

## 2018-10-25 LAB — CULTURE, RESPIRATORY

## 2018-10-25 MED ORDER — LACTATED RINGERS IV SOLN
INTRAVENOUS | Status: DC
  Administered 2018-10-25: 23:00:00 via INTRAVENOUS

## 2018-10-25 MED ORDER — HEPARIN SODIUM (PORCINE) 5000 UNIT/ML IJ SOLN
5000.0000 [IU] | Freq: Three times a day (TID) | INTRAMUSCULAR | Status: DC
  Administered 2018-10-25 – 2018-11-19 (×75): 5000 [IU] via SUBCUTANEOUS
  Filled 2018-10-25 (×75): qty 1

## 2018-10-25 MED ORDER — POTASSIUM CHLORIDE 20 MEQ/15ML (10%) PO SOLN
40.0000 meq | Freq: Once | ORAL | Status: AC
  Administered 2018-10-25: 40 meq
  Filled 2018-10-25: qty 30

## 2018-10-25 MED ORDER — PANTOPRAZOLE SODIUM 40 MG IV SOLR
40.0000 mg | Freq: Every day | INTRAVENOUS | Status: DC
  Administered 2018-10-25 – 2018-10-26 (×2): 40 mg via INTRAVENOUS
  Filled 2018-10-25 (×2): qty 40

## 2018-10-25 MED ORDER — SODIUM CHLORIDE 0.9 % IV SOLN
1.0000 g | Freq: Three times a day (TID) | INTRAVENOUS | Status: DC
  Administered 2018-10-26 – 2018-10-28 (×9): 1 g via INTRAVENOUS
  Filled 2018-10-25 (×13): qty 1

## 2018-10-25 NOTE — Progress Notes (Signed)
RT NOTE: RT called to bedside due to patients sats 80%. MD at bedside when RT arrived and told RT to return Fi02 to 100%. RT will continue to monitor.

## 2018-10-25 NOTE — ED Provider Notes (Signed)
[facility] EMERGENCY DEPARTMENT Provider Note   CSN: [id number] Arrival date & time: Nov 14, 2018  1633    History   Chief Complaint Chief Complaint  Patient presents with  . [facility] by [facility]-needs increased peep    HPI Kathy Buckley is a 28 y.o. female.     HPI   Unfortunate 27yF with VDRF s/p trach about a year ago after anoxic brain injury. Sent from [facility] because apparently she has required increasing ventilator support. She was just discharged from the hospital for multi-focal pneumonia and discharged on 4/20 back to [facility]. Covid ruled out. Vent settings from the day of discharge:  PRVC FiO2 (%):   30  Set Rate:  16 bmp Vt Set:   480 mL PEEP:  5 cmH20 Plateau Pressure:  37 cmH20   Note from [facility] today:  Nov 14, 2018, 11:59.  FiO2 increased to 1.00 to maintain SpO2 greater than 90%. Set rate increased to 22, PCV increased to 46. ABG obtained. Results RBAV by Dr. Welton Flakes. Order received to increase PEEP. Dr. Debbe Bales notified of ABG and order to increase PEEP. Expressed to Dr Debbe Bales concern about increasing PEEP without continuous B/P monitoring due to potential decreased venous return. Dr Debbe Bales stated he would confer with Dr Welton Flakes.  11-14-2018 14:42.  Dr. Welton Flakes ordered to transport to [facility] for hire level of care. Resident currently at peep level of 10 than needs to be supported with telemetry. Vitals stable BP 95/57 HR 97 RR 22 Temp 98.2 axillary. [facility] notified to transport resident and stated it will be about 1 hr to 1 1/2 hour before transport can take place. Resident's mother Kathy Buckley notified of transport.   On 4/22 there is mention of bronchoscopy being performed but no report. Progress note mentioning resp cultures with Acinepoabacter baumannii and Achromobacter denitrificams but no mention of sensitivities.     Past Medical History:  Diagnosis Date  . Acute respiratory failure (HCC)   . Anoxic brain damage (HCC)   . Cardiac arrest  (HCC)   . Central corneal ulcer, left eye   . Idiopathic hypotension   . Tachyarrhythmia   . Viral hepatitis     Patient Active Problem List   Diagnosis Date Noted  . Sepsis due to pneumonia (HCC) 10/21/2018  . HCAP (healthcare-associated pneumonia) 10/21/2018  . Acute and chronic respiratory failure with hypoxia (HCC) 10/21/2018  . Atelectasis, right 10/21/2018  . Anoxic encephalopathy (HCC) 10/21/2018  . Symptomatic anemia 10/21/2018  . Hypoalbuminemia 10/21/2018    History reviewed. No pertinent surgical history.   OB History   No obstetric history on file.      Home Medications    Prior to Admission medications   Not on File    Family History History reviewed. No pertinent family history.  Social History Social History   Tobacco Use  . Smoking status: Unknown If Ever Smoked  Substance Use Topics  . Alcohol use: Not Currently  . Drug use: Not Currently     Allergies   Chlorhexidine and Other   Review of Systems Review of Systems  Level 5 caveat because pt is nonverbal.  Physical Exam Updated Vital Signs BP 93/63   Pulse 95   Temp 97.7 F (36.5 C) (Rectal)   Resp 11   Ht  (1.651 m)   Wt 87 kg   SpO2 100%   BMI 31.92 kg/m   Physical Exam Vitals signs and nursing note reviewed.  Constitutional:      General:  She is not in acute distress.    Appearance: She is well-developed.  HENT:     Head: Normocephalic and atraumatic.  Eyes:     General:        Right eye: No discharge.        Left eye: No discharge.     Conjunctiva/sclera: Conjunctivae normal.  Neck:     Musculoskeletal: Neck supple.  Cardiovascular:     Rate and Rhythm: Normal rate and regular rhythm.     Heart sounds: Normal heart sounds. No murmur. No friction rub. No gallop.      Comments: PICC RUE Pulmonary:     Effort: Pulmonary effort is normal. No respiratory distress.     Breath sounds: Normal breath sounds.     Comments: tracheostomy Abdominal:     General:  There is no distension.     Palpations: Abdomen is soft.     Tenderness: There is no abdominal tenderness.  Musculoskeletal:        General: No tenderness.  Skin:    General: Skin is warm and dry.  Neurological:     Comments: gcs 3      ED Treatments / Results  Labs (all labs ordered are listed, but only abnormal results are displayed) Labs Reviewed  URINE CULTURE - Abnormal; Notable for the following components:      Result Value   Culture 50,000 COLONIES/mL YEAST (*)    All other components within normal limits  CBC WITH DIFFERENTIAL/PLATELET - Abnormal; Notable for the following components:   WBC 19.3 (*)    RBC 3.68 (*)    Hemoglobin 7.9 (*)    HCT 30.3 (*)    MCH 21.5 (*)    MCHC 26.1 (*)    RDW 26.0 (*)    Platelets 476 (*)    nRBC 0.6 (*)    Neutro Abs 16.4 (*)    nRBC 1 (*)    All other components within normal limits  BASIC METABOLIC PANEL - Abnormal; Notable for the following components:   Potassium 3.4 (*)    Glucose, Bld 147 (*)    All other components within normal limits  BLOOD GAS, ARTERIAL - Abnormal; Notable for the following components:   pH, Arterial 7.462 (*)    pCO2 arterial 48.6 (*)    pO2, Arterial 179 (*)    Bicarbonate 34.5 (*)    Acid-Base Excess 10.1 (*)    All other components within normal limits  CBC - Abnormal; Notable for the following components:   WBC 14.1 (*)    RBC 3.47 (*)    Hemoglobin 7.5 (*)    HCT 28.1 (*)    MCH 21.6 (*)    MCHC 26.7 (*)    RDW 25.8 (*)    Platelets 465 (*)    nRBC 0.7 (*)    All other components within normal limits  BASIC METABOLIC PANEL - Abnormal; Notable for the following components:   CO2 35 (*)    Glucose, Bld 120 (*)    All other components within normal limits  PHOSPHORUS - Abnormal; Notable for the following components:   Phosphorus 1.4 (*)    All other components within normal limits  GLUCOSE, CAPILLARY - Abnormal; Notable for the following components:   Glucose-Capillary 102 (*)     All other components within normal limits  GLUCOSE, CAPILLARY - Abnormal; Notable for the following components:   Glucose-Capillary 142 (*)    All other components within normal limits  GLUCOSE,  CAPILLARY - Abnormal; Notable for the following components:   Glucose-Capillary 116 (*)    All other components within normal limits  GLUCOSE, CAPILLARY - Abnormal; Notable for the following components:   Glucose-Capillary 155 (*)    All other components within normal limits  GLUCOSE, CAPILLARY - Abnormal; Notable for the following components:   Glucose-Capillary 138 (*)    All other components within normal limits  POCT I-STAT 7, (LYTES, BLD GAS, ICA,H+H) - Abnormal; Notable for the following components:   pH, Arterial 7.340 (*)    pCO2 arterial 75.4 (*)    pO2, Arterial 244.0 (*)    Bicarbonate 40.7 (*)    TCO2 43 (*)    Acid-Base Excess 12.0 (*)    Sodium 147 (*)    Potassium 3.4 (*)    HCT 34.0 (*)    Hemoglobin 11.6 (*)    All other components within normal limits  CULTURE, BLOOD (ROUTINE X 2)  CULTURE, BLOOD (ROUTINE X 2)  SARS CORONAVIRUS 2 (HOSPITAL ORDER, PERFORMED IN [facility])  CULTURE, RESPIRATORY  MAGNESIUM  LACTIC ACID, PLASMA  CBC  BASIC METABOLIC PANEL  MAGNESIUM  PHOSPHORUS  BLOOD GAS, ARTERIAL    EKG EKG Interpretation  Date/Time:  Thursday October 25 2018 16:42:26 EDT Ventricular Rate:  96 PR Interval:    QRS Duration: 82 QT Interval:  363 QTC Calculation: 459 R Axis:   71 Text Interpretation:  Sinus rhythm Right atrial enlargement No significant change since last tracing Confirmed by Shaune PollackIsaacs, Cameron [phone]([id number]) on 10/26/2018 9:54:15 AM   Radiology Dg Chest Port 1 View  Result Date: 10/26/2018 CLINICAL DATA:  28 year old female with pneumonia. Negative for COVID-19. EXAM: PORTABLE CHEST 1 VIEW COMPARISON:  10/06/2018 and earlier. FINDINGS: Portable AP semi upright view at 0411 hours. Stable tracheostomy tube and right PICC line. Bilateral mid  and lower lung predominant widespread Patchy and confluent pulmonary opacity persists. Superimposed bilateral lower lobe atelectasis suspected. Ventilation has not significantly changed since 10/21/2018. No pneumothorax. Stable cardiac size and mediastinal contours. IMPRESSION: 1. Stable lines and tubes. 2. Ventilation not significantly changed since 10/21/2018. Bilateral mid and lower lung predominant patchy and confluent pulmonary opacity. Electronically Signed   By: Odessa FlemingH  Hall M.D.   On: 10/26/2018 07:12   Dg Chest Portable 1 View  Result Date: 10/27/2018 CLINICAL DATA:  Increased need for ventilator support EXAM: PORTABLE CHEST 1 VIEW COMPARISON:  10/22/2018, 10/21/2018, 10/26/2017 FINDINGS: Tracheostomy tube is in place. Right upper extremity catheter tip over the cavoatrial region. Extensive interstitial and alveolar infiltrates bilaterally, with slight progression of consolidation in the left perihilar lung. Stable cardiomediastinal silhouette. No pneumothorax. IMPRESSION: Persistent extensive bilateral interstitial and alveolar airspace disease with slight worsening of consolidation in the left mid lung. Electronically Signed   By: Jasmine PangKim  Fujinaga M.D.   On: 10/26/2018 18:49   [place] Ekg Site Rite  Result Date: 10/26/2018 If Site Rite image not attached, placement could not be confirmed due to current cardiac rhythm.   Procedures Procedures (including critical care time)  Medications Ordered in ED Medications - No data to display   Initial Impression / Assessment and Plan / ED Course  I have reviewed the triage vital signs and the nursing notes.  Pertinent labs & imaging results that were available during my care of the patient were reviewed by me and considered in my medical decision making (see chart for details).  27yF with acute on chronic respiratory failure. CCM consulted for admission.   Final Clinical Impressions(s) /  ED Diagnoses   Final diagnoses:  Acute respiratory failure [facility])     ED Discharge Orders    None       Raeford Razor, MD 10/26/18 2349

## 2018-10-25 NOTE — ED Notes (Signed)
Respiratory at bedside to draw ABG 

## 2018-10-25 NOTE — ED Notes (Addendum)
MD Kohut aware of systolics in the 90's, will continue to monitor until further instruction.

## 2018-10-25 NOTE — Progress Notes (Signed)
RT NOTE: RT attempted to obtain ABG unsuccessful x2. Second RT called to attempt.   Patient current peak pressure on vent 55, MD is aware.   RT will continue to monitor.

## 2018-10-25 NOTE — ED Triage Notes (Addendum)
Pt from [facility], pt here due to requiring a peep of 10 which needs greater cardiac monitoring than [facility] can handle. Pt has known pneumonia and was seen recently for sepsis workup. Pt is a GCS of 3 at baseline and has no brain activity per her last MRI. PICC and foley present on arrival.

## 2018-10-25 NOTE — Progress Notes (Signed)
Pharmacy Antibiotic Note  Kathy Buckley is a 28 y.o. female admitted on 10/21/2018 with pneumonia.  Pharmacy has been consulted for meropenem dosing. Chronic vent patient with recent admission for pna, trach aspirate Cx with achromobacter xylosoxidans, imipenem sens (10/21/2018).    SCr 0.55, CrCl >100 ml/min  Plan: Meropenem 1g IV every 8 hours Monitor renal function, Cx and clinical progression to narrow  Height: 5\' 5"  (165.1 cm) Weight: 191 lb 12.8 oz (87 kg) IBW/kg (Calculated) : 57  Temp (24hrs), Avg:97.7 F (36.5 C), Min:97.7 F (36.5 C), Max:97.7 F (36.5 C)  Recent Labs  Lab 10/21/18 1832 10/21/18 1837 10/21/18 2000 10/22/18 0339 10/27/2018 1757  WBC  --  22.7*  --  19.9* 19.3*  CREATININE  --  0.51  --  0.40* 0.55  LATICACIDVEN 2.5*  --  2.7* 1.9  --     Estimated Creatinine Clearance: 117.1 mL/min (by C-G formula based on SCr of 0.55 mg/dL).    Allergies  Allergen Reactions  . Chlorhexidine     Other reaction(s): Angioedema (ALLERGY/intolerance)  . Other     chlorahexidine    Antimicrobials this admission: Meropenem 4/23>>  Dose adjustments this admission: n/a  Microbiology results: 4/23 BCx: sent 4/23 UCx: sent 4/23 Trach aspirate  Daylene Posey, PharmD Clinical Pharmacist Please check AMION for all [facility] numbers 10/11/2018 8:33 PM

## 2018-10-25 NOTE — ED Notes (Signed)
Nurse Navigator contact with mother and father of the patient [phone]. The patients family was provided with updates pertaining to patient status with plans for assessment. The family agrees to be contacted with any additional updates. They do not have any questions at this time but will contact us if needed. They thanked this Charity fundraiser for contact.

## 2018-10-25 NOTE — Progress Notes (Signed)
RT NOTE: RT reported critical value to Dr.Kohut, MD. ABG as follows: pH 7.34, CO2 75.4, PaO2 244, HCO3 40.7. Per MD change rate to 20 and FiO2 60%. Per MD changed tidal volume to 450 (8cc). RT will continue to monitor.

## 2018-10-25 NOTE — ED Notes (Signed)
ED TO INPATIENT HANDOFF REPORT  ED Nurse Name and Phone #:  Jess F   S Name/Age/Gender Kathy Buckley 28 y.o. female Room/Bed: RESUSC/RESUSC  Code Status   Code Status: Full Code  Home/SNF/Other Skilled nursing facility Patient oriented to: none Is this baseline? Yes   Triage Complete: Triage complete  Chief Complaint Respiratory distress  Triage Note Pt from [facility], pt here due to requiring a peep of 10 which needs greater cardiac monitoring than [facility] can handle. Pt has known pneumonia and was seen recently for sepsis workup. Pt is a GCS of 3 at baseline and has no brain activity per her last MRI. PICC and foley present on arrival.    Allergies Allergies  Allergen Reactions  . Chlorhexidine     Other reaction(s): Angioedema (ALLERGY/intolerance)  . Other     chlorahexidine    Level of Care/Admitting Diagnosis ED Disposition    ED Disposition Condition Comment   Admit  Hospital Area: [facility] [[id number]]  Level of Care: ICU [6]  Covid Evaluation: N/A  Diagnosis: VAP (ventilator-associated pneumonia) (HCC) [644034]  Admitting Physician: Carin Hock [[id number]]  Attending Physician: Carin Hock [[id number]]  Estimated length of stay: > 1 week  Certification:: I certify this patient will need inpatient services for at least 2 midnights  PT Class (Do Not Modify): Inpatient [101]  PT Acc Code (Do Not Modify): Private [1]       B Medical/Surgery History Past Medical History:  Diagnosis Date  . Acute respiratory failure (HCC)   . Anoxic brain damage (HCC)   . Cardiac arrest (HCC)   . Central corneal ulcer, left eye   . Idiopathic hypotension   . Tachyarrhythmia   . Viral hepatitis    History reviewed. No pertinent surgical history.   A IV Location/Drains/Wounds Patient Lines/Drains/Airways Status   Active Line/Drains/Airways    Name:   Placement date:   Placement time:   Site:   Days:   PICC Single Lumen PICC Right 3  cm   -    -    -      Urethral Catheter [facility]   10/21/18    2100    -   4   Tracheostomy Shiley 7 mm Cuffed   -    -    7 mm      Tracheostomy Laryngectomy   -    -    -             Intake/Output Last 24 hours No intake or output data in the 24 hours ending 10/20/2018 2003  Labs/Imaging Results for orders placed or performed during the hospital encounter of 10/26/2018 (from the past 48 hour(s))  CBC with Differential     Status: Abnormal   Collection Time: 10/26/2018  5:57 PM  Result Value Ref Range   WBC 19.3 (H) 4.0 - 10.5 K/uL   RBC 3.68 (L) 3.87 - 5.11 MIL/uL   Hemoglobin 7.9 (L) 12.0 - 15.0 g/dL   HCT 32.9 (L) 51.8 - 84.1 %   MCV 82.3 80.0 - 100.0 fL   MCH 21.5 (L) 26.0 - 34.0 pg   MCHC 26.1 (L) 30.0 - 36.0 g/dL   RDW 66.0 (H) 63.0 - 16.0 %   Platelets 476 (H) 150 - 400 K/uL   nRBC 0.6 (H) 0.0 - 0.2 %   Neutrophils Relative % 85 %   Neutro Abs 16.4 (H) 1.7 - 7.7 K/uL   Lymphocytes Relative 9 %  Lymphs Abs 1.7 0.7 - 4.0 K/uL   Monocytes Relative 4 %   Monocytes Absolute 0.8 0.1 - 1.0 K/uL   Eosinophils Relative 2 %   Eosinophils Absolute 0.4 0.0 - 0.5 K/uL   Basophils Relative 0 %   Basophils Absolute 0.0 0.0 - 0.1 K/uL   nRBC 1 (H) 0 /100 WBC   Abs Immature Granulocytes 0.00 0.00 - 0.07 K/uL   Polychromasia PRESENT     Comment: Performed at [facility], [address] [place], [place] [address]  Basic metabolic panel     Status: Abnormal   Collection Time: 10/22/2018  5:57 PM  Result Value Ref Range   Sodium 143 135 - 145 mmol/L   Potassium 3.4 (L) 3.5 - 5.1 mmol/L   Chloride 100 98 - 111 mmol/L   CO2 32 22 - 32 mmol/L   Glucose, Bld 147 (H) 70 - 99 mg/dL   BUN 19 6 - 20 mg/dL   Creatinine, Ser 9.81 0.44 - 1.00 mg/dL   Calcium 9.4 8.9 - 19.1 mg/dL   GFR calc non Af Amer >60 >60 mL/min   GFR calc Af Amer >60 >60 mL/min   Anion gap 11 5 - 15    Comment: Performed at [facility], [address], [place], [place] [address]  I-STAT 7, (LYTES, BLD GAS,  ICA, H+H)     Status: Abnormal   Collection Time: 10/14/2018  6:07 PM  Result Value Ref Range   pH, Arterial 7.340 (L) 7.350 - 7.450   pCO2 arterial 75.4 (HH) 32.0 - 48.0 mmHg   pO2, Arterial 244.0 (H) 83.0 - 108.0 mmHg   Bicarbonate 40.7 (H) 20.0 - 28.0 mmol/L   TCO2 43 (H) 22 - 32 mmol/L   O2 Saturation 100.0 %   Acid-Base Excess 12.0 (H) 0.0 - 2.0 mmol/L   Sodium 147 (H) 135 - 145 mmol/L   Potassium 3.4 (L) 3.5 - 5.1 mmol/L   Calcium, Ion 1.34 1.15 - 1.40 mmol/L   HCT 34.0 (L) 36.0 - 46.0 %   Hemoglobin 11.6 (L) 12.0 - 15.0 g/dL   Patient temperature HIDE    Collection site RADIAL, ALLEN'S TEST ACCEPTABLE    Drawn by RT    Sample type ARTERIAL    Comment NOTIFIED PHYSICIAN    Dg Chest Portable 1 View  Result Date: 10/14/2018 CLINICAL DATA:  Increased need for ventilator support EXAM: PORTABLE CHEST 1 VIEW COMPARISON:  10/22/2018, 10/21/2018, 10/26/2017 FINDINGS: Tracheostomy tube is in place. Right upper extremity catheter tip over the cavoatrial region. Extensive interstitial and alveolar infiltrates bilaterally, with slight progression of consolidation in the left perihilar lung. Stable cardiomediastinal silhouette. No pneumothorax. IMPRESSION: Persistent extensive bilateral interstitial and alveolar airspace disease with slight worsening of consolidation in the left mid lung. Electronically Signed   By: Jasmine Pang M.D.   On: 10/08/2018 18:49    Pending Labs Unresulted Labs (From admission, onward)    Start     Ordered   10/26/18 0500  CBC  Tomorrow morning,   R     10/26/2018 1943   10/26/18 0500  Basic metabolic panel  Tomorrow morning,   R     10/24/2018 1943   10/26/18 0500  Magnesium  Tomorrow morning,   R     10/30/2018 1943   10/26/18 0500  Phosphorus  Tomorrow morning,   R     10/16/2018 1943   10/12/2018 2300  Blood gas, arterial  Once,   R  10/03/2018 1943   10/05/2018 1941  Culture, blood (routine x 2)  BLOOD CULTURE X 2,   R     10/31/2018 1943   10/10/2018 1941  Urine  culture  Once,   R     10/05/2018 1943   10/07/2018 1941  Culture, respiratory (tracheal aspirate)  Once,   R     10/18/2018 1943   10/21/2018 1720  Blood gas, arterial  Once,   R    Question:  Room air or oxygen  Answer:  Oxygen   10/04/2018 1719          Vitals/Pain Today's Vitals   10/12/2018 1815 10/29/2018 1845 10/07/2018 1900 10/08/2018 1932  BP: (!) 89/56 91/66 100/75 (!) 141/104  Pulse: 94 90 (!) 101 (!) 123  Resp: 20 20 20 19   Temp:      TempSrc:      SpO2: 100% 93% 100% 100%  Weight:      Height:        Isolation Precautions No active isolations  Medications Medications  heparin injection 5,000 Units (has no administration in time range)  pantoprazole (PROTONIX) injection 40 mg (has no administration in time range)  lactated ringers infusion (has no administration in time range)    Mobility  bedbound High fall risk   Focused Assessments Pulmonary Assessment Handoff:  Lung sounds: Bilateral Breath Sounds: Diminished, Rhonchi L Breath Sounds: Coarse crackles R Breath Sounds: Coarse crackles O2 Device: Ventilator        R Recommendations: See Admitting Provider Note  Report given to:   Additional Notes: n/a

## 2018-10-25 NOTE — H&P (Signed)
NAME:  Kathy Buckley, MRN:  [id number], DOB:  August 09, 1990, LOS: 0 ADMISSION DATE:  07/07/2018, CONSULTATION DATE: October 25, 2018 REFERRING MD: Dr. Juleen ChinaKohut, CHIEF COMPLAINT: Hypoxia  Brief History   28 year old female requiring chronic vent secondary to anoxic injury in 2019.  Recently admitted for pneumonia and returning for worsening vent needs.  History of present illness   28 year old female with past medical history as below, which is significant for anoxic brain injury following PEA arrest secondary to postpartum hemorrhage in April 2019.  She resides at [facility] where I am told her baseline GCS is 3.  She was recently admitted on 4/19 to [facility] with worsening oxygenation.  She was ruled out for COVID-19 at that time with a negative nasopharyngeal swab.  Pneumonia was presumed to be bacterial and she was treated with cefepime, Flagyl, and vancomycin. ( Fever and tachycardia, which were the reasons for her presentation) had resolved and she was transferred back to [facility] the following day on 4/20.  Tracheal aspirate taken while inpatient then became positive for multidrug-resistant Achromobacter.  After return to [facility] on 4/20 she continued to have worsening oxygen requirements, which ultimately caused her to be transferred back [facility] emergency department on 4/23.  She was requiring 100% FiO2 at that time and PCCM had been asked to evaluate for admission.  Past Medical History   has a past medical history of Acute respiratory failure (HCC), Anoxic brain damage (HCC), Cardiac arrest (HCC), Central corneal ulcer, left eye, Idiopathic hypotension, Tachyarrhythmia, and Viral hepatitis.  Significant Hospital Events   [facility]20 Transfer to [facility] 4/23 Admit to [facility] for hypoxia  Consults:    Procedures:  Trach Pre-admit >  Significant Diagnostic Tests:    Micro Data:  4/19 tracheal aspirate >   Antimicrobials:  Cefepime 4/19 > 4/23 Flagyl 4/19 >  4/23 Vancomycin 4/19 > 4/23 Imipenem 4/23 >>>  Interim history/subjective:    Objective   Blood pressure (!) 141/104, pulse (!) 123, temperature 97.7 F (36.5 C), temperature source Rectal, resp. rate 19, height 5\' 5"  (1.651 m), weight 87 kg, SpO2 100 %.    Vent Mode: PRVC FiO2 (%):  [60 %-100 %] 100 % Set Rate:  [14 bmp-20 bmp] 20 bmp Vt Set:  [450 mL-500 mL] 450 mL PEEP:  [5 cmH20] 5 cmH20 Plateau Pressure:  [30 cmH20-40 cmH20] 40 cmH20  No intake or output data in the 24 hours ending 06/09/19 1950 Filed Weights   06/09/19 1641  Weight: 87 kg    Examination: General: young adult female on vent  HENT: [place]/AT, No JVD Lungs: Coarse. Thick trach secretions Cardiovascular: Tachy, regular.  Abdomen: Soft, non-tender, non-distended. PEG Extremities: No acute deformity [facility]   Resolved Hospital Problem list     Assessment & Plan:   Acute on chronic mixed respiratory failure: Chronic trach vent, now exacerbated by bacterial pneumonia. Tracheal aspirate from 4/19 positive for achromobacter sensitive to imipenem.  - Vent support - ABG - meropenem - VAP bundle  Tachycardia: Sinus on ECG. Also some borderline hypotension.  - Give IV fluids. 1L LR bolus now.   Hypokalemia - give K 40 meq per tube.   Anemia - Follow cbc  Global: Is reportedly GCS 3 at baseline. Family has been approached multiple times with palliative discussions and prefers to keep Full Code despite very poor long term prognosis.   Best practice:  Diet: TF Pain/Anxiety/Delirium protocol (if indicated): PRN VAP protocol (if indicated):  Y DVT prophylaxis: Heparin GI prophylaxis: Protonix Glucose control: n/a Mobility: BR Code Status: FULL Family Communication: Mom Cala Bradford updated Disposition: ICU  Labs   CBC: Recent Labs  Lab 10/21/18 1837 10/22/18 0339 10/22/18 0502 10/16/2018 1757 10/10/2018 1807  WBC 22.7* 19.9*  --  19.3*  --   NEUTROABS 18.3* 17.7*  --  16.4*  --   HGB 6.7* 7.9*  11.2* 7.9* 11.6*  HCT 27.4* 28.2* 33.0* 30.3* 34.0*  MCV 77.8* 78.1*  --  82.3  --   PLT 617* 564*  --  476*  --     Basic Metabolic Panel: Recent Labs  Lab 10/21/18 1837 10/22/18 0339 10/22/18 0502 10/10/2018 1757 10/10/2018 1807  NA 140 143 145 143 147*  K 4.1 3.2* 3.1* 3.4* 3.4*  CL 96* 98  --  100  --   CO2 35* 39*  --  32  --   GLUCOSE 311* 205*  --  147*  --   BUN 17 11  --  19  --   CREATININE 0.51 0.40*  --  0.55  --   CALCIUM 9.2 9.3  --  9.4  --   MG  --  1.7  --   --   --   PHOS  --  1.9*  --   --   --    GFR: Estimated Creatinine Clearance: 117.1 mL/min (by C-G formula based on SCr of 0.55 mg/dL). Recent Labs  Lab 10/21/18 1832 10/21/18 1837 10/21/18 1952 10/21/18 2000 10/22/18 0339 10/26/2018 1757  PROCALCITON  --   --  1.83  --  2.02  --   WBC  --  22.7*  --   --  19.9* 19.3*  LATICACIDVEN 2.5*  --   --  2.7* 1.9  --     Liver Function Tests: Recent Labs  Lab 10/21/18 1837  AST 20  ALT 15  ALKPHOS 179*  BILITOT 0.3  PROT 9.9*  ALBUMIN 1.7*   No results for input(s): LIPASE, AMYLASE in the last 168 hours. No results for input(s): AMMONIA in the last 168 hours.  ABG    Component Value Date/Time   PHART 7.340 (L) 10/09/2018 1807   PCO2ART 75.4 (HH) 10/11/2018 1807   PO2ART 244.0 (H) 10/06/2018 1807   HCO3 40.7 (H) 11/01/2018 1807   TCO2 43 (H) 10/08/2018 1807   O2SAT 100.0 10/06/2018 1807     Coagulation Profile: No results for input(s): INR, PROTIME in the last 168 hours.  Cardiac Enzymes: No results for input(s): CKTOTAL, CKMB, CKMBINDEX, TROPONINI in the last 168 hours.  HbA1C: No results found for: HGBA1C  CBG: Recent Labs  Lab 10/22/18 0051 10/22/18 0319 10/22/18 0801 10/22/18 1144 10/22/18 1621  GLUCAP 211* 193* 181* 142* 176*    Review of Systems:   unable  Past Medical History  She,  has a past medical history of Acute respiratory failure (HCC), Anoxic brain damage (HCC), Cardiac arrest (HCC), Central corneal ulcer,  left eye, Idiopathic hypotension, Tachyarrhythmia, and Viral hepatitis.   Surgical History   History reviewed. No pertinent surgical history.   Social History   reports previous alcohol use. She reports previous drug use.   Family History   Her family history is not on file.   Allergies Allergies  Allergen Reactions  . Chlorhexidine     Other reaction(s): Angioedema (ALLERGY/intolerance)  . Other     chlorahexidine     Home Medications  Prior to Admission medications   Not on File  Critical care time: 45 mins     Joneen Roach, AGACNP-BC [facility] Pulmonary/Critical Care Pager [phone] or [phone]  10/13/2018 8:24 PM

## 2018-10-25 NOTE — ED Notes (Addendum)
Pt desatted to 82% on 60% FIO2, MD Kohut and respiratory came to bedside and respiratory adjusted vent settings to 100% FiO2, SPO2 came up to 94%. Will continue to monitor.

## 2018-10-25 NOTE — ED Notes (Signed)
MD Kohut at bedside. 

## 2018-10-26 ENCOUNTER — Inpatient Hospital Stay: Payer: Self-pay

## 2018-10-26 ENCOUNTER — Inpatient Hospital Stay (HOSPITAL_COMMUNITY): Payer: Medicaid Other

## 2018-10-26 LAB — BLOOD GAS, ARTERIAL
Acid-Base Excess: 10.1 mmol/L — ABNORMAL HIGH (ref 0.0–2.0)
Bicarbonate: 34.5 mmol/L — ABNORMAL HIGH (ref 20.0–28.0)
Drawn by: 36496
FIO2: 80
MECHVT: 450 mL
O2 Saturation: 99.4 %
PEEP: 5 cmH2O
Patient temperature: 97.5
RATE: 20 resp/min
pCO2 arterial: 48.6 mmHg — ABNORMAL HIGH (ref 32.0–48.0)
pH, Arterial: 7.462 — ABNORMAL HIGH (ref 7.350–7.450)
pO2, Arterial: 179 mmHg — ABNORMAL HIGH (ref 83.0–108.0)

## 2018-10-26 LAB — CBC
HCT: 28.1 % — ABNORMAL LOW (ref 36.0–46.0)
Hemoglobin: 7.5 g/dL — ABNORMAL LOW (ref 12.0–15.0)
MCH: 21.6 pg — ABNORMAL LOW (ref 26.0–34.0)
MCHC: 26.7 g/dL — ABNORMAL LOW (ref 30.0–36.0)
MCV: 81 fL (ref 80.0–100.0)
Platelets: 465 10*3/uL — ABNORMAL HIGH (ref 150–400)
RBC: 3.47 MIL/uL — ABNORMAL LOW (ref 3.87–5.11)
RDW: 25.8 % — ABNORMAL HIGH (ref 11.5–15.5)
WBC: 14.1 10*3/uL — ABNORMAL HIGH (ref 4.0–10.5)
nRBC: 0.7 % — ABNORMAL HIGH (ref 0.0–0.2)

## 2018-10-26 LAB — BASIC METABOLIC PANEL
Anion gap: 8 (ref 5–15)
BUN: 15 mg/dL (ref 6–20)
CO2: 35 mmol/L — ABNORMAL HIGH (ref 22–32)
Calcium: 9.6 mg/dL (ref 8.9–10.3)
Chloride: 102 mmol/L (ref 98–111)
Creatinine, Ser: 0.47 mg/dL (ref 0.44–1.00)
GFR calc Af Amer: >60 mL/min (ref >60)
GFR calc non Af Amer: >60 mL/min (ref >60)
Glucose, Bld: 120 mg/dL — ABNORMAL HIGH (ref 70–99)
Potassium: 3.8 mmol/L (ref 3.5–5.1)
Sodium: 145 mmol/L (ref 135–145)

## 2018-10-26 LAB — CULTURE, BLOOD (ROUTINE X 2)
Culture: NO GROWTH
Culture: NO GROWTH
Special Requests: ADEQUATE

## 2018-10-26 LAB — MAGNESIUM: Magnesium: 1.9 mg/dL (ref 1.7–2.4)

## 2018-10-26 LAB — URINE CULTURE: Culture: 50000 — AB

## 2018-10-26 LAB — SARS CORONAVIRUS 2 BY RT PCR (HOSPITAL ORDER, PERFORMED IN ~~LOC~~ HOSPITAL LAB): SARS Coronavirus 2: NEGATIVE

## 2018-10-26 LAB — LACTIC ACID, PLASMA: Lactic Acid, Venous: 1.9 mmol/L (ref 0.5–1.9)

## 2018-10-26 LAB — GLUCOSE, CAPILLARY
Glucose-Capillary: 102 mg/dL — ABNORMAL HIGH (ref 70–99)
Glucose-Capillary: 116 mg/dL — ABNORMAL HIGH (ref 70–99)
Glucose-Capillary: 138 mg/dL — ABNORMAL HIGH (ref 70–99)
Glucose-Capillary: 142 mg/dL — ABNORMAL HIGH (ref 70–99)
Glucose-Capillary: 155 mg/dL — ABNORMAL HIGH (ref 70–99)

## 2018-10-26 LAB — PHOSPHORUS: Phosphorus: 1.4 mg/dL — ABNORMAL LOW (ref 2.5–4.6)

## 2018-10-26 MED ORDER — INSULIN ASPART 100 UNIT/ML ~~LOC~~ SOLN
0.0000 [IU] | SUBCUTANEOUS | Status: DC
  Administered 2018-10-26: 2 [IU] via SUBCUTANEOUS
  Administered 2018-10-26: 20:00:00 3 [IU] via SUBCUTANEOUS
  Administered 2018-10-27 – 2018-10-28 (×6): 2 [IU] via SUBCUTANEOUS
  Administered 2018-10-29: 3 [IU] via SUBCUTANEOUS
  Administered 2018-10-29 (×2): 2 [IU] via SUBCUTANEOUS
  Administered 2018-10-29: 3 [IU] via SUBCUTANEOUS
  Administered 2018-10-29 – 2018-10-30 (×4): 2 [IU] via SUBCUTANEOUS
  Administered 2018-10-30 (×2): 3 [IU] via SUBCUTANEOUS
  Administered 2018-10-30 – 2018-10-31 (×2): 2 [IU] via SUBCUTANEOUS
  Administered 2018-10-31 (×2): 3 [IU] via SUBCUTANEOUS
  Administered 2018-11-01: 2 [IU] via SUBCUTANEOUS
  Administered 2018-11-01: 20:00:00 3 [IU] via SUBCUTANEOUS
  Administered 2018-11-02: 04:00:00 2 [IU] via SUBCUTANEOUS
  Administered 2018-11-02 – 2018-11-03 (×2): 3 [IU] via SUBCUTANEOUS
  Administered 2018-11-03: 2 [IU] via SUBCUTANEOUS
  Administered 2018-11-03: 5 [IU] via SUBCUTANEOUS
  Administered 2018-11-03 – 2018-11-04 (×3): 2 [IU] via SUBCUTANEOUS
  Administered 2018-11-04 (×2): 3 [IU] via SUBCUTANEOUS
  Administered 2018-11-04: 09:00:00 2 [IU] via SUBCUTANEOUS
  Administered 2018-11-04 (×3): 3 [IU] via SUBCUTANEOUS
  Administered 2018-11-05: 5 [IU] via SUBCUTANEOUS
  Administered 2018-11-05 (×2): 3 [IU] via SUBCUTANEOUS
  Administered 2018-11-05: 20:00:00 2 [IU] via SUBCUTANEOUS
  Administered 2018-11-05 – 2018-11-06 (×3): 3 [IU] via SUBCUTANEOUS
  Administered 2018-11-06: 12:00:00 2 [IU] via SUBCUTANEOUS
  Administered 2018-11-07: 16:00:00 3 [IU] via SUBCUTANEOUS
  Administered 2018-11-07: 01:00:00 5 [IU] via SUBCUTANEOUS
  Administered 2018-11-07 – 2018-11-08 (×4): 2 [IU] via SUBCUTANEOUS
  Administered 2018-11-08: 5 [IU] via SUBCUTANEOUS
  Administered 2018-11-09: 11:00:00 2 [IU] via SUBCUTANEOUS
  Administered 2018-11-09: 3 [IU] via SUBCUTANEOUS
  Administered 2018-11-09 – 2018-11-11 (×5): 2 [IU] via SUBCUTANEOUS
  Administered 2018-11-11: 3 [IU] via SUBCUTANEOUS
  Administered 2018-11-11 – 2018-11-14 (×5): 2 [IU] via SUBCUTANEOUS
  Administered 2018-11-14: 3 [IU] via SUBCUTANEOUS
  Administered 2018-11-15: 08:00:00 2 [IU] via SUBCUTANEOUS
  Administered 2018-11-15: 04:00:00 3 [IU] via SUBCUTANEOUS
  Administered 2018-11-15 – 2018-11-16 (×2): 2 [IU] via SUBCUTANEOUS
  Administered 2018-11-16: 20:00:00 3 [IU] via SUBCUTANEOUS
  Administered 2018-11-16 – 2018-11-17 (×4): 2 [IU] via SUBCUTANEOUS
  Administered 2018-11-17 (×3): 3 [IU] via SUBCUTANEOUS
  Administered 2018-11-17 – 2018-11-18 (×3): 2 [IU] via SUBCUTANEOUS
  Administered 2018-11-18 – 2018-11-19 (×5): 3 [IU] via SUBCUTANEOUS

## 2018-10-26 MED ORDER — OSMOLITE 1.2 CAL PO LIQD
1000.0000 mL | ORAL | Status: DC
  Administered 2018-10-26 – 2018-11-12 (×18): 1000 mL
  Filled 2018-10-26 (×20): qty 1000

## 2018-10-26 MED ORDER — SODIUM CHLORIDE 0.9 % IV SOLN
INTRAVENOUS | Status: DC | PRN
  Administered 2018-10-26 – 2018-10-28 (×3): 250 mL via INTRAVENOUS
  Administered 2018-11-04 – 2018-11-11 (×3): 500 mL via INTRAVENOUS
  Administered 2018-11-11 – 2018-11-13 (×3): 250 mL via INTRAVENOUS

## 2018-10-26 MED ORDER — PRO-STAT SUGAR FREE PO LIQD
60.0000 mL | Freq: Two times a day (BID) | ORAL | Status: DC
  Administered 2018-10-26 – 2018-11-14 (×36): 60 mL
  Filled 2018-10-26 (×36): qty 60

## 2018-10-26 MED ORDER — SODIUM CHLORIDE 0.9% FLUSH
10.0000 mL | Freq: Two times a day (BID) | INTRAVENOUS | Status: DC
  Administered 2018-10-26 – 2018-11-06 (×20): 10 mL
  Administered 2018-11-07: 20 mL
  Administered 2018-11-08 – 2018-11-19 (×22): 10 mL

## 2018-10-26 MED ORDER — LACTATED RINGERS IV BOLUS
1000.0000 mL | Freq: Once | INTRAVENOUS | Status: AC
  Administered 2018-10-26: 1000 mL via INTRAVENOUS

## 2018-10-26 MED ORDER — CHLORHEXIDINE GLUCONATE CLOTH 2 % EX PADS
6.0000 | MEDICATED_PAD | Freq: Every day | CUTANEOUS | Status: DC
  Administered 2018-10-26: 17:00:00 6 via TOPICAL

## 2018-10-26 MED ORDER — LACTATED RINGERS IV BOLUS
500.0000 mL | Freq: Once | INTRAVENOUS | Status: AC
  Administered 2018-10-26: 500 mL via INTRAVENOUS

## 2018-10-26 MED ORDER — ORAL CARE MOUTH RINSE
15.0000 mL | OROMUCOSAL | Status: DC
  Administered 2018-10-26 – 2018-11-19 (×242): 15 mL via OROMUCOSAL

## 2018-10-26 MED ORDER — SODIUM PHOSPHATES 45 MMOLE/15ML IV SOLN
30.0000 mmol | Freq: Once | INTRAVENOUS | Status: AC
  Administered 2018-10-26: 17:00:00 30 mmol via INTRAVENOUS
  Filled 2018-10-26: qty 10

## 2018-10-26 MED ORDER — LACTATED RINGERS IV BOLUS: 500.0000 mL | Freq: Once | INTRAVENOUS | Status: DC

## 2018-10-26 MED ORDER — SODIUM CHLORIDE 0.9% FLUSH: 10.0000 mL | INTRAVENOUS | Status: DC | PRN

## 2018-10-26 MED ORDER — NOREPINEPHRINE 4 MG/250ML-% IV SOLN
0.0000 ug/min | INTRAVENOUS | Status: DC
  Administered 2018-10-26: 2 ug/min via INTRAVENOUS
  Administered 2018-10-28: 0.5 ug/min via INTRAVENOUS
  Administered 2018-10-28: 1 ug/min via INTRAVENOUS
  Filled 2018-10-26 (×2): qty 250

## 2018-10-26 MED ORDER — LACTATED RINGERS IV BOLUS
250.0000 mL | Freq: Once | INTRAVENOUS | Status: AC
  Administered 2018-10-26: 250 mL via INTRAVENOUS

## 2018-10-26 NOTE — Progress Notes (Signed)
NAME:  Kathy Buckley, MRN:  [id number], DOB:  1991-04-14, LOS: 1 ADMISSION DATE:  26-Jan-2019, CONSULTATION DATE: October 25, 2018 REFERRING MD: Dr. Juleen ChinaKohut, CHIEF COMPLAINT: Hypoxia  Brief History   28 year old female requiring chronic vent secondary to anoxic injury in 2019.  Recently admitted for pneumonia and returning from [facility] for worsening vent needs.  She resides at [facility] with baseline GCS is 3.  She was recently admitted on 4/19 to [facility] with worsening oxygenation.  She was ruled out for COVID-19 at that time with a negative nasopharyngeal swab.  Pneumonia was presumed to be bacterial and she was treated with cefepime, Flagyl, and vancomycin. ( Fever and tachycardia, which were the reasons for her presentation) had resolved and she was transferred back to [facility] the following day on 4/20.  Tracheal aspirate taken while inpatient then became positive for multidrug-resistant Achromobacter.   Past Medical History   has a past medical history of Acute respiratory failure (HCC), Anoxic brain damage (HCC), Cardiac arrest (HCC), Central corneal ulcer, left eye, Idiopathic hypotension, Tachyarrhythmia, and Viral hepatitis.  Significant Hospital Events   [facility] 4/20 [facility] to [facility] 4/23 Admit to [facility] for hypoxia  Consults:    Procedures:  Trach Pre-admit >  Significant Diagnostic Tests:    Micro Data:  4/19 tracheal aspirate >   Antimicrobials:  Cefepime 4/19 > 4/23 Flagyl 4/19 > 4/23 Vancomycin 4/19 > 4/23 Imipenem 4/23 >>>  Interim history/subjective:  Remains on vent. Levophed is weaning down  Objective   Blood pressure 93/64, pulse 96, temperature (!) 97.4 F (36.3 C), temperature source Oral, resp. rate 16, height 5\' 5"  (1.651 m), weight 76.8 kg, SpO2 95 %.    Vent Mode: PRVC FiO2 (%):  [60 %-100 %] 60 % Set Rate:  [14 bmp-20 bmp] 16 bmp Vt Set:  [450 mL-500 mL] 450 mL PEEP:  [5 cmH20] 5 cmH20 Plateau Pressure:  [30  cmH20-40 cmH20] 30 cmH20   Intake/Output Summary (Last 24 hours) at 10/26/2018 1012 Last data filed at 10/26/2018 0900 Gross per 24 hour  Intake 2587.71 ml  Output 785 ml  Net 1802.71 ml   Filed Weights   Apr 11, 2019 1641 10/26/18 0500  Weight: 87 kg 76.8 kg    Examination: Gen:      No acute distress HEENT:  EOMI, sclera anicteric Neck:     No masses; no thyromegaly, trach Lungs:    Clear to auscultation bilaterally; normal respiratory effort CV:         Regular rate and rhythm; no murmurs Abd:      + bowel sounds; soft, non-tender; no palpable masses, no distension Ext:    No edema; adequate peripheral perfusion Skin:      Warm and dry; no rash Neuro[facility]  Resolved Hospital Problem list     Assessment & Plan:   Acute on chronic mixed respiratory failure: Chronic trach vent, now exacerbated by bacterial pneumonia. Tracheal aspirate from 4/19 positive for achromobacter sensitive to imipenem.  Continue vent support Per [facility] protocol she will need repeat COVID testing as she came back from facility Continue meropenem. Follow repeat cultures  Septic shock Continue fluid resuscitation Wean down pressors  Anemia Follow cbc  Global: Is reportedly GCS 3 at baseline. Family has been approached multiple times with palliative discussions and prefers to keep Full Code despite very poor long term prognosis.   Best practice:  Diet: TF Pain/Anxiety/Delirium protocol (if indicated): PRN VAP protocol (if indicated): YJeannie Fend  DVT prophylaxis: Heparin SQ GI prophylaxis: Protonix Glucose control: n/a Mobility: BR Code Status: FULL Family Communication: Mom Kimberly updated 4/23 Disposition: ICU  Labs   CBC: Recent Labs  Lab 10/21/18 1837 10/22/18 0339 10/22/18 0502 10/07/2018 1757 10/12/2018 1807 10/26/18 0356  WBC 22.7* 19.9*  --  19.3*  --  14.1*  NEUTROABS 18.3* 17.7*  --  16.4*  --   --   HGB 6.7* 7.9* 11.2* 7.9* 11.6* 7.5*  HCT 27.4* 28.2* 33.0* 30.3* 34.0* 28.1*  MCV  77.8* 78.1*  --  82.3  --  81.0  PLT 617* 564*  --  476*  --  465*    Basic Metabolic Panel: Recent Labs  Lab 10/21/18 1837 10/22/18 0339 10/22/18 0502 10/28/2018 1757 10/29/2018 1807 10/26/18 0356  NA 140 143 145 143 147* 145  K 4.1 3.2* 3.1* 3.4* 3.4* 3.8  CL 96* 98  --  100  --  102  CO2 35* 39*  --  32  --  35*  GLUCOSE 311* 205*  --  147*  --  120*  BUN 17 11  --  19  --  15  CREATININE 0.51 0.40*  --  0.55  --  0.47  CALCIUM 9.2 9.3  --  9.4  --  9.6  MG  --  1.7  --   --   --  1.9  PHOS  --  1.9*  --   --   --  1.4*   GFR: Estimated Creatinine Clearance: 110.1 mL/min (by C-G formula based on SCr of 0.47 mg/dL). Recent Labs  Lab 10/21/18 1832 10/21/18 1837 10/21/18 1952 10/21/18 2000 10/22/18 0339 10/03/2018 1757 10/26/18 0356  PROCALCITON  --   --  1.83  --  2.02  --   --   WBC  --  22.7*  --   --  19.9* 19.3* 14.1*  LATICACIDVEN 2.5*  --   --  2.7* 1.9  --  1.9    Liver Function Tests: Recent Labs  Lab 10/21/18 1837  AST 20  ALT 15  ALKPHOS 179*  BILITOT 0.3  PROT 9.9*  ALBUMIN 1.7*   No results for input(s): LIPASE, AMYLASE in the last 168 hours. No results for input(s): AMMONIA in the last 168 hours.  ABG    Component Value Date/Time   PHART 7.462 (H) 10/26/2018 0030   PCO2ART 48.6 (H) 10/26/2018 0030   PO2ART 179 (H) 10/26/2018 0030   HCO3 34.5 (H) 10/26/2018 0030   TCO2 43 (H) 11/01/2018 1807   O2SAT 99.4 10/26/2018 0030     Coagulation Profile: No results for input(s): INR, PROTIME in the last 168 hours.  Cardiac Enzymes: No results for input(s): CKTOTAL, CKMB, CKMBINDEX, TROPONINI in the last 168 hours.  HbA1C: No results found for: HGBA1C  CBG: Recent Labs  Lab 10/22/18 0319 10/22/18 0801 10/22/18 1144 10/22/18 1621 10/26/18 0334  GLUCAP 193* 181* 142* 176* 102*   The patient is critically ill with multiple organ system failure and requires high complexity decision making for assessment and support, frequent evaluation and  titration of therapies, advanced monitoring, review of radiographic studies and interpretation of complex data.   Critical Care Time devoted to patient care services, exclusive of separately billable procedures, described in this note is 35 minutes.   Chilton Greathouse MD [place] Pager [phone] If no answer call [phone] 10/26/2018, 10:19 AM

## 2018-10-26 NOTE — Progress Notes (Signed)
eLink Physician-Brief Progress Note Patient Name: Kathy Buckley DOB: Aug 10, 1990 MRN: [id number]   Date of Service  10/26/2018  HPI/Events of Note  Pt admitted with bilateral pneumonia and sepsis. Blood pressure remains soft, along with urine output  eICU Interventions  Lactated Ringers 250 ml iv bolus x 1, begin low dose Norepinephrine infusion, arterial line ordered for closer BP monitoring, stat lactate ordered.        Thomasene Lot Atina Feeley 10/26/2018, 3:35 AM

## 2018-10-26 NOTE — Progress Notes (Addendum)
CSW aware that pt is from [facility]. CSW following for further needs at this time.     Claude Manges Berneita Sanagustin, MSW, LCSW-A Emergency Department Clinical Social Worker [phone]

## 2018-10-26 NOTE — TOC Initial Note (Addendum)
Transition of Care [facility](TOC) - Initial/Assessment Note    Patient Details  Name: Kathy BryantBriante C Axelrod MRN: [id number] Date of Birth: [birth year]  Transition of Care [facility](TOC) CM/SW Contact:    Inis Sizerarol L Raza Bayless, LCSW Phone Number: 10/26/2018, 10:07 AM  Clinical Narrative: CSW received consult for patient due to [facility]. CSW completed chart review, patient is here for critical medical care. CSW spoke with patient's parents, Cala BradfordKimberly and [place]ie via telephone to complete assessment. Mother stated that patient has been at [facility] for 10-11 months and lived at home prior to that. Mother reports that [facility] is very convenient for the family but is concerned about how the patient recently contracted pneumonia while at the facility. Mother states that the patient's trach is often left not cared for appropriately which allows fluid and germs to enter patient's respiratory system. CSW informed mother that those concerns would be documented. Mother and father both stated that the staff at [facility] are friendly and that they desire the patient to return there whenever she is medically cleared [facility]. CSW encouraged parents to reach out to hospital staff for updates and to address any questions or concerns, parents agreeable.  CSW spoke with Misty StanleyStacey at [facility] in admissions to discuss patient's return. Misty StanleyStacey stated that the pulmonologist at [facility] is requesting to review the patient's chart due to concerns of whether or not [facility] is able to provide the appropriate level of care for patient.  CSW spoke with MD regarding additional COVID testing prior to discharge, MD agreeable.    Expected Discharge Plan: Skilled Nursing Facility     Patient Goals and CMS Choice Patient states their goals for this hospitalization and ongoing recovery are:: Return to [facility]   Choice offered to / list presented to : NA  Expected Discharge Plan and Services Expected Discharge Plan: Skilled Nursing  Facility       Living arrangements for the past 2 months: Skilled Nursing Facility                                      Prior Living Arrangements/Services Living arrangements for the past 2 months: Skilled Nursing Facility Lives with:: Self Patient language and need for interpreter reviewed:: No Do you feel safe going back to the place where you live?: Yes      Need for Family Participation in Patient Care: Yes (Comment) Care giver support system in place?: Yes (comment)   Criminal Activity/Legal Involvement Pertinent to Current Situation/Hospitalization: No - Comment as needed  Activities of Daily Living      Permission Sought/Granted Permission sought to share information with : Family Supports Permission granted to share information with : Yes, Verbal Permission Granted  Share Information with NAME: Cala BradfordKimberly & Ivar DrapeWillie Lindenbaum     Permission granted to share info w Relationship: Parents  Permission granted to share info w Contact Information: [phone], [phone]  Emotional Assessment Appearance:: (N/A) Attitude/Demeanor/Rapport: (Parents were pleasant, receptive to CSW) Affect (typically observed): Accepting   Alcohol / Substance Use: Not Applicable Psych Involvement: No (comment)  Admission diagnosis:  Respiratory distress Patient Active Problem List   Diagnosis Date Noted  . VAP (ventilator-associated pneumonia) (HCC) 10/06/2018  . Sepsis due to pneumonia (HCC) 10/21/2018  . HCAP (healthcare-associated pneumonia) 10/21/2018  . Acute and chronic respiratory failure with hypoxia (HCC) 10/21/2018  . Atelectasis, right 10/21/2018  . Anoxic brain damage (HCC) 10/21/2018  .  Symptomatic anemia 10/21/2018  . Hypoalbuminemia 10/21/2018   PCP:  Crist Fat, MD Pharmacy:  No Pharmacies Listed    Social Determinants of Health (SDOH) Interventions    Readmission Risk Interventions No flowsheet data found.   Edwin Dada, MSW, LCSW-A Clinical  Social Worker Emergency Department [employer] [phone]

## 2018-10-26 NOTE — Progress Notes (Addendum)
Initial Nutrition Assessment RD working remotely.  DOCUMENTATION CODES:   Not applicable  INTERVENTION:     Osmolite 1.2 at 40 ml/h (960 ml per day)  Pro-stat 60 ml BID  Provides 1552 kcal, 113 gm protein, 787 ml free water daily  NUTRITION DIAGNOSIS:   Inadequate oral intake related to inability to eat as evidenced by NPO status.  GOAL:   Patient will meet greater than or equal to 90% of their needs  MONITOR:   TF tolerance, Labs, Skin, I & O's  REASON FOR ASSESSMENT:   Ventilator, Consult Enteral/tube feeding initiation and management  ASSESSMENT:   28 yo female with PMH of anoxic brain injury in 2019, chronic trach/vent, G-tube, resident at [facility] who was admitted with hypoxia. COVID-19 negative.  Received MD Consult for TF initiation and management. Patient has a G-tube. Unsure of usual TF regimen at [facility].   Patient is on chronic ventilator support. MV: 7.3 L/min Temp (24hrs), Avg:96.9 F (36.1 C), Min:94.3 F (34.6 C), Max:97.7 F (36.5 C)   Labs reviewed. Phosphorus 1.4 (L) CBG's: 142-102 Medications reviewed and include Levophed, sodium phosphate bolus x 1.     NUTRITION - FOCUSED PHYSICAL EXAM:  unable to complete  Diet Order:   Diet Order            Diet NPO time specified  Diet effective now              EDUCATION NEEDS:   No education needs have been identified at this time  Skin:  Skin Assessment: Reviewed RN Assessment  Last BM:  no BM documented  Height:   Ht Readings from Last 1 Encounters:  10/13/2018 5\' 5"  (1.651 m)    Weight:   Wt Readings from Last 1 Encounters:  10/26/18 76.8 kg    Ideal Body Weight:  56.8 kg  BMI:  Body mass index is 28.18 kg/m.  Estimated Nutritional Needs:   Kcal:  1575  Protein:  100-115 gm  Fluid:  1.6 L    Joaquin Courts, RD, LDN, CNSC Pager [phone] After Hours Pager [phone]

## 2018-10-26 NOTE — Progress Notes (Signed)
Peripherally Inserted Central Catheter/Midline Placement  The IV Nurse has discussed with the patient and/or persons authorized to consent for the patient, the purpose of this procedure and the potential benefits and risks involved with this procedure.  The benefits include less needle sticks, lab draws from the catheter, and the patient may be discharged home with the catheter. Risks include, but not limited to, infection, bleeding, blood clot (thrombus formation), and puncture of an artery; nerve damage and irregular heartbeat and possibility to perform a PICC exchange if needed/ordered by physician.  Alternatives to this procedure were also discussed.  Bard Power PICC patient education guide, fact sheet on infection prevention and patient information card has been provided to patient /or left at bedside.    PICC/Midline Placement Documentation  PICC Single Lumen PICC Right 3 cm (Active)  Indication for Insertion or Continuance of Line Chronic illness with exacerbations (CF, Sickle Cell, etc.) 10/26/2018 12:00 PM  Exposed Catheter (cm) 3 cm 10/18/2018  4:51 PM  Site Assessment Clean;Dry;Intact 10/26/2018 12:00 PM  Line Status Infusing 10/26/2018 12:00 PM  Dressing Type Transparent 10/26/2018 12:00 PM  Dressing Status Clean;Dry;Intact 10/26/2018 12:00 PM  Line Care Connections checked and tightened 10/26/2018 12:00 PM  Dressing Intervention Other (Comment) 10/06/2018  9:30 PM  Dressing Change Due 10/28/18 10/26/2018  8:00 AM     PICC Triple Lumen 10/26/18 PICC Left Basilic 40 cm (Active)  Indication for Insertion or Continuance of Line Prolonged intravenous therapies 10/26/2018  4:00 PM  Site Assessment Clean;Dry;Intact 10/26/2018  4:00 PM  Lumen #1 Status Flushed;Blood return noted 10/26/2018  4:00 PM  Lumen #2 Status Flushed;Blood return noted 10/26/2018  4:00 PM  Lumen #3 Status Flushed;Blood return noted 10/26/2018  4:00 PM  Dressing Type Transparent 10/26/2018  4:00 PM  Dressing Status  Clean;Dry;Intact;Antimicrobial disc in place 10/26/2018  4:00 PM  Dressing Change Due 11/02/18 10/26/2018  4:00 PM    Telephone consent   Stacie Glaze Horton 10/26/2018, 4:10 PM

## 2018-10-27 ENCOUNTER — Inpatient Hospital Stay (HOSPITAL_COMMUNITY): Payer: Medicaid Other

## 2018-10-27 DIAGNOSIS — J9621 Acute and chronic respiratory failure with hypoxia: Secondary | ICD-10-CM

## 2018-10-27 LAB — BLOOD GAS, ARTERIAL
Acid-Base Excess: 7.1 mmol/L — ABNORMAL HIGH (ref 0.0–2.0)
Acid-Base Excess: 8.3 mmol/L — ABNORMAL HIGH (ref 0.0–2.0)
Bicarbonate: 33.4 mmol/L — ABNORMAL HIGH (ref 20.0–28.0)
Bicarbonate: 34.8 mmol/L — ABNORMAL HIGH (ref 20.0–28.0)
Drawn by: 36496
Drawn by: 36496
FIO2: 60
FIO2: 80
MECHVT: 320 mL
MECHVT: 500 mL
O2 Saturation: 80.6 %
O2 Saturation: 82.5 %
PEEP: 5 cmH2O
PEEP: 8 cmH2O
Patient temperature: 97.5
Patient temperature: 97.5
RATE: 20 resp/min
RATE: 20 resp/min
pCO2 arterial: 55.1 mmHg — ABNORMAL HIGH (ref 32.0–48.0)
pCO2 arterial: 88.9 mmHg (ref 32.0–48.0)
pH, Arterial: 7.213 — ABNORMAL LOW (ref 7.350–7.450)
pH, Arterial: 7.397 (ref 7.350–7.450)
pO2, Arterial: 46.4 mmHg — ABNORMAL LOW (ref 83.0–108.0)
pO2, Arterial: 58.2 mmHg — ABNORMAL LOW (ref 83.0–108.0)

## 2018-10-27 LAB — GLUCOSE, CAPILLARY
Glucose-Capillary: 111 mg/dL — ABNORMAL HIGH (ref 70–99)
Glucose-Capillary: 147 mg/dL — ABNORMAL HIGH (ref 70–99)
Glucose-Capillary: 157 mg/dL — ABNORMAL HIGH (ref 70–99)
Glucose-Capillary: 123 mg/dL — ABNORMAL HIGH (ref 70–99)
Glucose-Capillary: 114 mg/dL — ABNORMAL HIGH (ref 70–99)

## 2018-10-27 LAB — BASIC METABOLIC PANEL
Anion gap: 6 (ref 5–15)
Anion gap: 8 (ref 5–15)
BUN: 20 mg/dL (ref 6–20)
BUN: 21 mg/dL — ABNORMAL HIGH (ref 6–20)
CO2: 35 mmol/L — ABNORMAL HIGH (ref 22–32)
CO2: 35 mmol/L — ABNORMAL HIGH (ref 22–32)
Calcium: 8.8 mg/dL — ABNORMAL LOW (ref 8.9–10.3)
Calcium: 9.3 mg/dL (ref 8.9–10.3)
Chloride: 107 mmol/L (ref 98–111)
Chloride: 108 mmol/L (ref 98–111)
Creatinine, Ser: 0.46 mg/dL (ref 0.44–1.00)
Creatinine, Ser: 0.43 mg/dL — ABNORMAL LOW (ref 0.44–1.00)
GFR calc Af Amer: >60 mL/min (ref >60)
GFR calc Af Amer: >60 mL/min (ref >60)
GFR calc non Af Amer: >60 mL/min (ref >60)
GFR calc non Af Amer: >60 mL/min (ref >60)
Glucose, Bld: 126 mg/dL — ABNORMAL HIGH (ref 70–99)
Glucose, Bld: 157 mg/dL — ABNORMAL HIGH (ref 70–99)
Potassium: 3.2 mmol/L — ABNORMAL LOW (ref 3.5–5.1)
Potassium: 4.2 mmol/L (ref 3.5–5.1)
Sodium: 148 mmol/L — ABNORMAL HIGH (ref 135–145)
Sodium: 151 mmol/L — ABNORMAL HIGH (ref 135–145)

## 2018-10-27 LAB — CBC
HCT: 28.4 % — ABNORMAL LOW (ref 36.0–46.0)
Hemoglobin: 7.4 g/dL — ABNORMAL LOW (ref 12.0–15.0)
MCH: 21.8 pg — ABNORMAL LOW (ref 26.0–34.0)
MCHC: 26.1 g/dL — ABNORMAL LOW (ref 30.0–36.0)
MCV: 83.5 fL (ref 80.0–100.0)
Platelets: 426 10*3/uL — ABNORMAL HIGH (ref 150–400)
RBC: 3.4 MIL/uL — ABNORMAL LOW (ref 3.87–5.11)
RDW: 26.5 % — ABNORMAL HIGH (ref 11.5–15.5)
WBC: 12.7 10*3/uL — ABNORMAL HIGH (ref 4.0–10.5)
nRBC: 1 % — ABNORMAL HIGH (ref 0.0–0.2)

## 2018-10-27 LAB — MRSA PCR SCREENING: MRSA by PCR: NEGATIVE

## 2018-10-27 LAB — MAGNESIUM: Magnesium: 1.8 mg/dL (ref 1.7–2.4)

## 2018-10-27 LAB — PHOSPHORUS: Phosphorus: 5.7 mg/dL — ABNORMAL HIGH (ref 2.5–4.6)

## 2018-10-27 MED ORDER — MAGNESIUM SULFATE 2 GM/50ML IV SOLN
2.0000 g | Freq: Once | INTRAVENOUS | Status: AC
  Administered 2018-10-27: 2 g via INTRAVENOUS
  Filled 2018-10-27: qty 50

## 2018-10-27 MED ORDER — SCOPOLAMINE 1 MG/3DAYS TD PT72
1.0000 | MEDICATED_PATCH | TRANSDERMAL | Status: DC
  Administered 2018-10-27 – 2018-11-17 (×8): 1.5 mg via TRANSDERMAL
  Filled 2018-10-27 (×8): qty 1

## 2018-10-27 MED ORDER — PANTOPRAZOLE SODIUM 40 MG PO PACK
40.0000 mg | PACK | Freq: Every day | ORAL | Status: DC
  Administered 2018-10-27 – 2018-10-31 (×5): 40 mg
  Filled 2018-10-27 (×5): qty 20

## 2018-10-27 MED ORDER — FUROSEMIDE 10 MG/ML IJ SOLN
40.0000 mg | Freq: Every day | INTRAMUSCULAR | Status: AC
  Administered 2018-10-27 – 2018-10-29 (×3): 40 mg via INTRAVENOUS
  Filled 2018-10-27 (×3): qty 4

## 2018-10-27 MED ORDER — POTASSIUM CHLORIDE 20 MEQ/15ML (10%) PO SOLN
40.0000 meq | ORAL | Status: AC
  Administered 2018-10-27 (×2): 40 meq
  Filled 2018-10-27 (×2): qty 30

## 2018-10-27 MED ORDER — IPRATROPIUM-ALBUTEROL 0.5-2.5 (3) MG/3ML IN SOLN
3.0000 mL | Freq: Three times a day (TID) | RESPIRATORY_TRACT | Status: DC
  Administered 2018-10-27 – 2018-10-29 (×6): 3 mL via RESPIRATORY_TRACT
  Filled 2018-10-27 (×6): qty 3

## 2018-10-27 NOTE — Progress Notes (Signed)
Pt still having minimal UOP even after bolus of LR. Bladder scan showed 0-38mL. Pt is very edematous with pitting edema in all four extremities and abdomen is very distended. Pt needing increased FiO2 and PEEP increased to 8. [facility], Charity fundraiser from [place] called and notified. Will continue to monitor closely.  Caswell Corwin, RN 10/27/18 1:19 AM

## 2018-10-27 NOTE — Progress Notes (Addendum)
Pt with low UOP since the beginning of this shift. [facility] called and notified. Pt is very swollen and edematous. Will await new orders and continue to monitor closely.  Caswell Corwin, RN 10/26/2018 10:45 PM

## 2018-10-27 NOTE — Progress Notes (Signed)
RN updated pts mother Cala Bradford on pt status. All questions answered. Pts mother very appreciative and thankful of all of our care for her daughter.  Caswell Corwin, RN 10/27/18 10:02 PM

## 2018-10-27 NOTE — Progress Notes (Signed)
Pts mother, Mailey Betro updated on pts status overnight.

## 2018-10-27 NOTE — Procedures (Signed)
Marked air leak from tracheostomy.  Tracheostomy tube switch to 8 mm Portex with some improvement in air leak. Likely has large stoma opening at the level of trachea and requires tracheostomy balloon to be cinched up against the wall of the trachea to prevent air leak.  Lynnell Catalan, MD [facility] ICU Physician [facility]  Pager: [phone] Mobile: [phone] After hours: [phone].  10/27/2018, 5:06 PM

## 2018-10-27 NOTE — Progress Notes (Signed)
NAME:  Kathy Buckley, MRN:  [id number], DOB:  04-11-1991, LOS: 2 ADMISSION DATE:  Nov 21, 2018, CONSULTATION DATE: 11-21-18 REFERRING MD: Dr. 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  Recently admitted for pneumonia and returning from [facility] for worsening vent needs.  She resides at [facility] with baseline GCS is 3.  She was recently admitted on 4/19 to [facility] with worsening oxygenation.  She was ruled out for COVID-19 at that time with a negative nasopharyngeal swab.  Pneumonia was presumed to be bacterial and she was treated with cefepime, Flagyl, and vancomycin. ( Fever and tachycardia, which were the reasons for her presentation) had resolved and she was transferred back to [facility] the following day on 4/20.  Tracheal aspirate taken while inpatient then became positive for multidrug-resistant Achromobacter.   Past Medical History   has a past medical history of Acute respiratory failure (HCC), Anoxic brain damage (HCC), Cardiac arrest (HCC), Central corneal ulcer, left eye, Idiopathic hypotension, Tachyarrhythmia, and Viral hepatitis.  Significant Hospital Events   4/19 admit to [facility] for PNA 4/20 Transfer to [facility] 4/23 Admit to [facility] for hypoxia  Consults:    Procedures:  Trach Pre-admit >  Significant Diagnostic Tests:    Micro Data:  4/19 tracheal aspirate >   Antimicrobials:  Cefepime 4/19 > 4/23 Flagyl 4/19 > 4/23 Vancomycin 4/19 > 4/23 Imipenem 4/23 >>>  Interim history/subjective:  Remains mechanically ventilated.  Off vasopressors.  Increased airway pressures.  Marked cuff leak.  Objective   Blood pressure 101/62, pulse (!) 104, temperature (!) 96.5 F (35.8 C), temperature source Axillary, resp. rate (!) 30, height  (1.651 m), weight 76.8 kg, SpO2 96 %.    Vent Mode: PCV FiO2 (%):  [60 %-100 %] 60 % Set Rate:  [16 bmp-30 bmp] 30 bmp Vt Set:  [320  mL-500 mL] 500 mL PEEP:  [5 cmH20-8 cmH20] 8 cmH20 Plateau Pressure:  [37 cmH20-46 cmH20] 38 cmH20   Intake/Output Summary (Last 24 hours) at 10/27/2018 1714 Last data filed at 10/27/2018 1400 Gross per 24 hour  Intake 1915.95 ml  Output 950 ml  Net 965.95 ml   Filed Weights   2018-11-21 1641 10/26/18 0500  Weight: 87 kg 76.8 kg    Examination: Gen:      No acute distress HEENT: Tracheostomy in place with large stoma.  Copious oral secretions. Lungs:    Clear to auscultation bilaterally; normal respiratory effort.  Increased peak airway pressure.  Normal mean airway pressure. CV:         Regular rate and rhythm; no murmurs Abd:      + bowel sounds; soft, non-tender; no palpable masses, no distension Ext:    No edema; adequate peripheral perfusion Skin:      Warm and dry; no rash Neuro: [facility] Problem list   Septic shock  Assessment & Plan:   Critically ill due to acute on chronic mixed respiratory failure: Chronic trach vent, now exacerbated by bacterial pneumonia. Tracheal aspirate from 4/19 positive for achromobacter sensitive to imipenem.  Continue vent support Per [facility] protocol she will need repeat COVID testing as she came back from facility Continue meropenem. Follow repeat cultures Chest physiotherapy as high airway pressures likely due to increased resistance from airway secretions.  Goals of care : Is reportedly GCS 3 at baseline. Family has been approached multiple times with palliative discussions and prefers to keep  Full Code despite very poor long term prognosis.   Best practice:  Diet: TF Pain/Anxiety/Delirium protocol (if indicated): PRN VAP protocol (if indicated): Y DVT prophylaxis: Heparin SQ GI prophylaxis: Protonix Glucose control: n/a Mobility: BR Code Status: FULL Family Communication: Mom Kimberly updated 4/23 Disposition: ICU  Labs   CBC: Recent Labs  Lab 10/21/18 1837 10/22/18 0339 10/22/18 0502 2019-01-21 1757  2019-01-21 1807 10/26/18 0356 10/27/18 0205  WBC 22.7* 19.9*  --  19.3*  --  14.1* 12.7*  NEUTROABS 18.3* 17.7*  --  16.4*  --   --   --   HGB 6.7* 7.9* 11.2* 7.9* 11.6* 7.5* 7.4*  HCT 27.4* 28.2* 33.0* 30.3* 34.0* 28.1* 28.4*  MCV 77.8* 78.1*  --  82.3  --  81.0 83.5  PLT 617* 564*  --  476*  --  465* 426*    Basic Metabolic Panel: Recent Labs  Lab 10/22/18 0339  2019-01-21 1757 2019-01-21 1807 10/26/18 0356 10/27/18 0205 10/27/18 1339  NA 143   < > 143 147* 145 148* 151*  K 3.2*   < > 3.4* 3.4* 3.8 3.2* 4.2  CL 98  --  100  --  102 107 108  CO2 39*  --  32  --  35* 35* 35*  GLUCOSE 205*  --  147*  --  120* 126* 157*  BUN 11  --  19  --  15 20 21*  CREATININE 0.40*  --  0.55  --  0.47 0.46 0.43*  CALCIUM 9.3  --  9.4  --  9.6 8.8* 9.3  MG 1.7  --   --   --  1.9 1.8  --   PHOS 1.9*  --   --   --  1.4* 5.7*  --    < > = values in this interval not displayed.   GFR: Estimated Creatinine Clearance: 110.1 mL/min (A) (by C-G formula based on SCr of 0.43 mg/dL (L)). Recent Labs  Lab 10/21/18 1832  10/21/18 1952 10/21/18 2000 10/22/18 0339 2019-01-21 1757 10/26/18 0356 10/27/18 0205  PROCALCITON  --   --  1.83  --  2.02  --   --   --   WBC  --    < >  --   --  19.9* 19.3* 14.1* 12.7*  LATICACIDVEN 2.5*  --   --  2.7* 1.9  --  1.9  --    < > = values in this interval not displayed.    Liver Function Tests: Recent Labs  Lab 10/21/18 1837  AST 20  ALT 15  ALKPHOS 179*  BILITOT 0.3  PROT 9.9*  ALBUMIN 1.7*   No results for input(s): LIPASE, AMYLASE in the last 168 hours. No results for input(s): AMMONIA in the last 168 hours.  ABG    Component Value Date/Time   PHART 7.397 10/27/2018 0425   PCO2ART 55.1 (H) 10/27/2018 0425   PO2ART 46.4 (L) 10/27/2018 0425   HCO3 33.4 (H) 10/27/2018 0425   TCO2 43 (H) 2020-01-2019 1807   O2SAT 80.6 10/27/2018 0425     Coagulation Profile: No results for input(s): INR, PROTIME in the last 168 hours.  Cardiac Enzymes: No  results for input(s): CKTOTAL, CKMB, CKMBINDEX, TROPONINI in the last 168 hours.  HbA1C: No results found for: HGBA1C  CBG: Recent Labs  Lab 10/26/18 2344 10/27/18 0326 10/27/18 0742 10/27/18 1128 10/27/18 1527  GLUCAP 138* 111* 147* 157* 123*   The patient is critically ill with multiple organ system  failure and requires high complexity decision making for assessment and support, frequent evaluation and titration of therapies, advanced monitoring, review of radiographic studies and interpretation of complex data.   Critical Care Time devoted to patient care services, exclusive of separately billable procedures, described in this note is 35 minutes.   Lynnell Catalan, MD [facility] ICU Physician [facility] [place] Critical Care  Pager: [phone] Mobile: [phone] After hours: [phone].  10/27/2018, 5:16 PM      10/27/2018, 5:14 PM

## 2018-10-27 NOTE — Progress Notes (Signed)
eLink Physician-Brief Progress Note Patient Name: Kathy Buckley DOB: August 02, 1990 MRN: [id number]   Date of Service  10/27/2018  HPI/Events of Note  Hypokalemia and relatively low mag  eICU Interventions  Potassium and mag replaced     Intervention Category Intermediate Interventions: Electrolyte abnormality - evaluation and management  Kerensa Nicklas 10/27/2018, 5:04 AM

## 2018-10-27 NOTE — Procedures (Signed)
Critical care bronchoscopy procedure note.  Indication: High peak pressure.  Procedure description: A disposable bronchoscope was advanced without difficulty through the tracheostomy.  Normal secretions on left side.  Copious clear secretions on the right suctioned and sent for culture. Procedure was well-tolerated.  Lynnell Catalan, MD [facility] ICU Physician [facility] [place] Critical Care  Pager: [phone] Mobile: [phone] After hours: [phone].  10/27/2018, 5:12 PM

## 2018-10-27 NOTE — Progress Notes (Signed)
Dr Denese Killings notified of large cuff leak.

## 2018-10-28 LAB — GLUCOSE, CAPILLARY
Glucose-Capillary: 115 mg/dL — ABNORMAL HIGH (ref 70–99)
Glucose-Capillary: 116 mg/dL — ABNORMAL HIGH (ref 70–99)
Glucose-Capillary: 119 mg/dL — ABNORMAL HIGH (ref 70–99)
Glucose-Capillary: 127 mg/dL — ABNORMAL HIGH (ref 70–99)
Glucose-Capillary: 139 mg/dL — ABNORMAL HIGH (ref 70–99)
Glucose-Capillary: 143 mg/dL — ABNORMAL HIGH (ref 70–99)
Glucose-Capillary: 159 mg/dL — ABNORMAL HIGH (ref 70–99)

## 2018-10-28 LAB — BASIC METABOLIC PANEL
Anion gap: 6 (ref 5–15)
BUN: 22 mg/dL — ABNORMAL HIGH (ref 6–20)
CO2: 37 mmol/L — ABNORMAL HIGH (ref 22–32)
Calcium: 9.1 mg/dL (ref 8.9–10.3)
Chloride: 112 mmol/L — ABNORMAL HIGH (ref 98–111)
Creatinine, Ser: 0.45 mg/dL (ref 0.44–1.00)
GFR calc Af Amer: >60 mL/min (ref >60)
GFR calc non Af Amer: >60 mL/min (ref >60)
Glucose, Bld: 133 mg/dL — ABNORMAL HIGH (ref 70–99)
Potassium: 4.1 mmol/L (ref 3.5–5.1)
Sodium: 155 mmol/L — ABNORMAL HIGH (ref 135–145)

## 2018-10-28 MED ORDER — ALBUTEROL SULFATE (2.5 MG/3ML) 0.083% IN NEBU
2.5000 mg | INHALATION_SOLUTION | RESPIRATORY_TRACT | Status: DC | PRN
  Administered 2018-11-14 – 2018-11-15 (×2): 2.5 mg via RESPIRATORY_TRACT
  Filled 2018-10-28: qty 3

## 2018-10-28 MED ORDER — FREE WATER
200.0000 mL | Status: DC
  Administered 2018-10-28 – 2018-10-29 (×5): 200 mL

## 2018-10-28 MED ORDER — SULFAMETHOXAZOLE-TRIMETHOPRIM 400-80 MG PO TABS
2.0000 | ORAL_TABLET | Freq: Two times a day (BID) | ORAL | Status: DC
  Administered 2018-10-28 – 2018-10-29 (×2): 2
  Filled 2018-10-28 (×2): qty 2

## 2018-10-28 MED ORDER — VALPROATE SODIUM 500 MG/5ML IV SOLN
1000.0000 mg | Freq: Once | INTRAVENOUS | Status: AC
  Administered 2018-10-28: 14:00:00 1000 mg via INTRAVENOUS
  Filled 2018-10-28: qty 10

## 2018-10-28 MED ORDER — VALPROIC ACID 250 MG/5ML PO SOLN
500.0000 mg | Freq: Three times a day (TID) | ORAL | Status: DC
  Administered 2018-10-28 – 2018-11-01 (×11): 500 mg
  Filled 2018-10-28 (×12): qty 10

## 2018-10-28 MED ORDER — MIDAZOLAM HCL 2 MG/2ML IJ SOLN
4.0000 mg | Freq: Once | INTRAMUSCULAR | Status: AC
  Administered 2018-10-28: 4 mg via INTRAVENOUS
  Filled 2018-10-28: qty 4

## 2018-10-28 NOTE — Progress Notes (Signed)
Patient had multiple episodes of seizure like activity,upper body twitching.  Dr. Denese Killings made aware, orders placed in [facility].

## 2018-10-28 NOTE — Progress Notes (Signed)
Pharmacy Antibiotic Note  Kathy Buckley is a 28 y.o. female admitted on 10/13/2018 with pneumonia.  Pharmacy has been consulted for meropenem dosing. Chronic vent patient with recent admission for pna, trach aspirate Cx with achromobacter xylosoxidans, imipenem sens (10/21/2018).    SCr 0.45, CrCl >100 ml/min. BAL performed on 4/25 - cultures pending.  Plan: Meropenem 1 g IV q8h Monitor renal fx, cultures +  BAL results   Height: _0  (165.1 cm) Weight: 170 lb 3.1 oz (77.2 kg) IBW/kg (Calculated) : 57  Temp (24hrs), Avg:97.4 F (36.3 C), Min:96.5 F (35.8 C), Max:98.3 F (36.8 C)  Recent Labs  Lab 10/21/18 1832  10/21/18 1837 10/21/18 2000 10/22/18 0339 10/05/2018 1757 10/26/18 0356 10/27/18 0205 10/27/18 1339 10/28/18 0224  WBC  --   --  22.7*  --  19.9* 19.3* 14.1* 12.7*  --   --   CREATININE  --    < > 0.51  --  0.40* 0.55 0.47 0.46 0.43* 0.45  LATICACIDVEN 2.5*  --   --  2.7* 1.9  --  1.9  --   --   --    < > = values in this interval not displayed.    Antimicrobials this admission: Meropenem 4/23>>  Microbiology results: 4/25 BAL: ip 4/23 BCx: ngtd 4/23 UCx: 50K yeast 4/23 Trach aspirate 4/19 trach asp: ACHROMOBACTER XYLOSOXIDANS; sens to imipenem  4/19 COVID: neg  4/19 bcx: NGTD    Harvel Quale 10/28/2018 8:19 AM

## 2018-10-28 NOTE — Progress Notes (Signed)
CPT held at this time as pt is having decreased spo2. RN aware

## 2018-10-28 NOTE — Progress Notes (Signed)
NAME:  Kathy BryantBriante C Gargan, MRN:  [id number], DOB:  Nov 30, 1990, LOS: 3 ADMISSION DATE:  10/24/2018, CONSULTATION DATE: October 25, 2018 REFERRING MD: Dr. Juleen ChinaKohut, CHIEF COMPLAINT: Hypoxia  Brief History   28 year old female requiring chronic vent secondary to anoxic injury in 2019.  Recently admitted for pneumonia and returning from [facility] for worsening vent needs.  She resides at [facility] with baseline GCS is 3.  [facility] was [facility].  She was ruled out for COVID-19 at that time with a negative nasopharyngeal swab.  Pneumonia was presumed to be bacterial and she was treated with cefepime, Flagyl, and vancomycin. ( Fever and tachycardia, which were the reasons for her presentation) had resolved and she was transferred back to [facility] the following day on 4/20.  Tracheal aspirate taken while inpatient then became positive for multidrug-resistant Achromobacter.   Past Medical History   has a past medical history of Acute respiratory failure (HCC), Anoxic brain damage (HCC), Cardiac arrest (HCC), Central corneal ulcer, left eye, Idiopathic hypotension, Tachyarrhythmia, and Viral hepatitis.  Significant Hospital Events   [facility] 4/20 Transfer to [facility] 4/23 Admit to [facility] for hypoxia  Consults:  N/A  Procedures:  4/25 - tracheostomy change to 8 Portex. 4/25 - bronchoscopy shows small amount white secretions.   Significant Diagnostic Tests:  CXR 4/25 bilateral interstitial infiltrates.  Micro Data:  4/25 tracheal aspirate - >100,000 GNR   Antimicrobials:  Cefepime 4/19 > 4/23 Flagyl 4/19 > 4/23 Vancomycin 4/19 > 4/23 Imipenem 4/23 >>>  Interim history/subjective:  Remains mechanically ventilated.  On low-dose vasopressors.  Continues to have high Peak pressures.  Objective   Blood pressure 118/67, pulse (!) 109, temperature 97.6 F (36.4 C), temperature source Axillary, resp. rate (!) 30, height 5\' 5"   (1.651 m), weight 77.2 kg, SpO2 92 %.    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 Oral secretions have improved with scopolamine. Lungs: Bronchial breath sounds at both bases posteriorly. No wheezing. Ppeak 39 Pplat 30.  pressure.  Normal mean airway pressure. CV:   Regular rate and rhythm; no murmurs Abd:  + bowel sounds; soft, non-tender; no palpable masses, no distension Ext:  No edema; adequate peripheral perfusion Skin:  Warm and dry; no rash Neuro: Withdrawal with painful stimuli  Resolved Hospital Problem list   Septic shock  Assessment & Plan:   Critically ill due to acute on chronic mixed respiratory failure: Chronic trach vent, now  Continue vent support Chest physiotherapy as high airway pressures likely due to increased resistance from airway secretions. Continue bronchodilators. Attempt to diurese as remains in positive fluid balance for hospitalization and patient looks edematous.  HCAP associated multi-drug resistant bacterial pneumonia. GNR still present.  Continue Meropenem pending new culture results.  Goals of care : Is reportedly GCS 3 at baseline. Family has been approached multiple times with palliative discussions and prefers to keep Full Code despite very  poor long term prognosis.   Best practice:  Diet: TF via PEG tube. Pain/Anxiety/Delirium protocol (if indicated): PRN VAP protocol (if indicated): bundle ordered. DVT prophylaxis: Heparin SQ GI prophylaxis: Protonix Glucose control: n/a Mobility: BR Code  Status: FULL Family Communication: Mom Kimberly updated 4/23 Disposition: ICU  Labs   CBC: Recent Labs  Lab 10/21/18 1837 10/22/18 0339 10/22/18 0502 10/09/2018 1757 10/29/2018 1807 10/26/18 0356 10/27/18 0205  WBC 22.7* 19.9*  --  19.3*  --  14.1* 12.7*  NEUTROABS 18.3* 17.7*  --  16.4*  --   --   --   HGB 6.7* 7.9* 11.2* 7.9* 11.6* 7.5* 7.4*  HCT 27.4* 28.2* 33.0* 30.3* 34.0* 28.1* 28.4*  MCV 77.8* 78.1*  --  82.3  --  81.0 83.5  PLT 617* 564*  --  476*  --  465* 426*    Basic Metabolic Panel: Recent Labs  Lab 10/22/18 0339  10/29/2018 1757 10/09/2018 1807 10/26/18 0356 10/27/18 0205 10/27/18 1339 10/28/18 0224  NA 143   < > 143 147* 145 148* 151* 155*  K 3.2*   < > 3.4* 3.4* 3.8 3.2* 4.2 4.1  CL 98  --  100  --  102 107 108 112*  CO2 39*  --  32  --  35* 35* 35* 37*  GLUCOSE 205*  --  147*  --  120* 126* 157* 133*  BUN 11  --  19  --  15 20 21* 22*  CREATININE 0.40*  --  0.55  --  0.47 0.46 0.43* 0.45  CALCIUM 9.3  --  9.4  --  9.6 8.8* 9.3 9.1  MG 1.7  --   --   --  1.9 1.8  --   --   PHOS 1.9*  --   --   --  1.4* 5.7*  --   --    < > = values in this interval not displayed.   GFR: Estimated Creatinine Clearance: 110.5 mL/min (by C-G formula based on SCr of 0.45 mg/dL). Recent Labs  Lab 10/21/18 1832  10/21/18 1952 10/21/18 2000 10/22/18 0339 10/23/2018 1757 10/26/18 0356 10/27/18 0205  PROCALCITON  --   --  1.83  --  2.02  --   --   --   WBC  --    < >  --   --  19.9* 19.3* 14.1* 12.7*  LATICACIDVEN 2.5*  --   --  2.7* 1.9  --  1.9  --    < > = values in this interval not displayed.    Liver Function Tests: Recent Labs  Lab 10/21/18 1837  AST 20  ALT 15  ALKPHOS 179*  BILITOT 0.3  PROT 9.9*  ALBUMIN 1.7*   No results for input(s): LIPASE, AMYLASE in the last 168 hours. No results for input(s): AMMONIA in the last 168 hours.  ABG    Component Value Date/Time   PHART 7.397 10/27/2018 0425   PCO2ART 55.1 (H) 10/27/2018 0425   PO2ART 46.4 (L)  10/27/2018 0425   HCO3 33.4 (H) 10/27/2018 0425   TCO2 43 (H) 10/30/2018 1807   O2SAT 80.6 10/27/2018 0425     Coagulation Profile: No results for input(s): INR, PROTIME in the last 168 hours.  Cardiac Enzymes: No results for input(s): CKTOTAL, CKMB, CKMBINDEX, TROPONINI in the last 168 hours.  HbA1C: No results found for: HGBA1C  CBG: Recent Labs  Lab 10/27/18 1527 10/27/18 1950 10/28/18 0003 10/28/18 0357 10/28/18 0738  GLUCAP 123* 114*  139* 116* 119*   The patient is critically ill with multiple organ system failure and requires high complexity decision making for assessment and support, frequent evaluation and titration of therapies, advanced monitoring, review of radiographic studies and interpretation of complex data.   Critical Care Time devoted to patient care services, exclusive of separately billable procedures, described in this note is 35 minutes.   Lynnell Catalan, MD [facility] ICU Physician [facility] Critical Care  Pager: [phone] Mobile: [phone] After hours: [phone].  10/28/2018, 9:58 AM

## 2018-10-29 LAB — BASIC METABOLIC PANEL
Anion gap: 6 (ref 5–15)
BUN: 25 mg/dL — ABNORMAL HIGH (ref 6–20)
CO2: 38 mmol/L — ABNORMAL HIGH (ref 22–32)
Calcium: 8.8 mg/dL — ABNORMAL LOW (ref 8.9–10.3)
Chloride: 111 mmol/L (ref 98–111)
Creatinine, Ser: 0.59 mg/dL (ref 0.44–1.00)
GFR calc Af Amer: >60 mL/min (ref >60)
GFR calc non Af Amer: >60 mL/min (ref >60)
Glucose, Bld: 137 mg/dL — ABNORMAL HIGH (ref 70–99)
Potassium: 4 mmol/L (ref 3.5–5.1)
Sodium: 155 mmol/L — ABNORMAL HIGH (ref 135–145)

## 2018-10-29 LAB — GLUCOSE, CAPILLARY
Glucose-Capillary: 120 mg/dL — ABNORMAL HIGH (ref 70–99)
Glucose-Capillary: 152 mg/dL — ABNORMAL HIGH (ref 70–99)
Glucose-Capillary: 124 mg/dL — ABNORMAL HIGH (ref 70–99)
Glucose-Capillary: 141 mg/dL — ABNORMAL HIGH (ref 70–99)
Glucose-Capillary: 123 mg/dL — ABNORMAL HIGH (ref 70–99)
Glucose-Capillary: 132 mg/dL — ABNORMAL HIGH (ref 70–99)

## 2018-10-29 MED ORDER — ACETYLCYSTEINE 20 % IN SOLN
2.0000 mL | RESPIRATORY_TRACT | Status: DC
  Administered 2018-10-29 – 2018-11-02 (×23): 2 mL via RESPIRATORY_TRACT
  Administered 2018-11-02: 07:00:00 via RESPIRATORY_TRACT
  Administered 2018-11-02 – 2018-11-04 (×11): 2 mL via RESPIRATORY_TRACT
  Filled 2018-10-29 (×38): qty 4

## 2018-10-29 MED ORDER — ACETYLCYSTEINE 10 % IN SOLN
2.0000 mL | RESPIRATORY_TRACT | Status: DC
  Filled 2018-10-29 (×8): qty 2

## 2018-10-29 MED ORDER — FREE WATER
400.0000 mL | Status: DC
  Administered 2018-10-29 – 2018-11-01 (×19): 400 mL

## 2018-10-29 MED ORDER — IPRATROPIUM-ALBUTEROL 0.5-2.5 (3) MG/3ML IN SOLN
3.0000 mL | RESPIRATORY_TRACT | Status: DC
  Administered 2018-10-29 – 2018-11-04 (×36): 3 mL via RESPIRATORY_TRACT
  Filled 2018-10-29 (×36): qty 3

## 2018-10-29 MED ORDER — SULFAMETHOXAZOLE-TRIMETHOPRIM 800-160 MG PO TABS
2.0000 | ORAL_TABLET | Freq: Two times a day (BID) | ORAL | Status: DC
  Administered 2018-10-29: 21:00:00 2
  Filled 2018-10-29 (×2): qty 2

## 2018-10-29 MED ORDER — SULFAMETHOXAZOLE-TRIMETHOPRIM 800-160 MG PO TABS
1.0000 | ORAL_TABLET | Freq: Once | ORAL | Status: AC
  Administered 2018-10-29: 11:00:00 1 via ORAL
  Filled 2018-10-29: qty 1

## 2018-10-29 NOTE — Progress Notes (Signed)
NAME:  Kathy Buckley, MRN:  [id number], DOB:  19-Nov-1990, LOS: 4 ADMISSION DATE:  11/10/2018, CONSULTATION DATE: 10-Nov-2018 REFERRING MD: Dr. 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  Recently admitted for pneumonia and returning from [facility] for worsening vent needs.  She resides at [facility] with baseline GCS is 3.  She was recently admitted on 4/19 to [facility] with worsening oxygenation.  She was ruled out for COVID-19 at that time with a negative nasopharyngeal swab.  Pneumonia was presumed to be bacterial and she was treated with cefepime, Flagyl, and vancomycin. ( Fever and tachycardia, which were the reasons for her presentation) had resolved and she was transferred back to [facility] the following day on 4/20.  Tracheal aspirate taken while inpatient then became positive for multidrug-resistant Achromobacter.   Past Medical History   has a past medical history of Acute respiratory failure (HCC), Anoxic brain damage (HCC), Cardiac arrest (HCC), Central corneal ulcer, left eye, Idiopathic hypotension, Tachyarrhythmia, and Viral hepatitis.  Significant Hospital Events   4/19 admit to [facility] for PNA 4/20 Transfer to [facility] 4/23 Admit to [facility] for hypoxia  Consults:  N/A  Procedures:  4/25 - tracheostomy change to 8 Portex. 4/25 - bronchoscopy shows small amount white secretions.   Significant Diagnostic Tests:  CXR 4/25 bilateral interstitial infiltrates.  Micro Data:  4/25 tracheal aspirate - >100,000 Stenotrophomonas  Antimicrobials:  Cefepime 4/19 > 4/23 Flagyl 4/19 > 4/23 Vancomycin 4/19 > 4/23 Imipenem 4/23 >>>  Interim history/subjective:  Remains mechanically ventilated.  Pressors are off.  Continues to have high Peak pressures.  Objective   Blood pressure 128/81, pulse (!) 106, temperature 98.5 F (36.9 C), temperature source Oral, resp. rate (!) 30, height 5\' 5"   (1.651 m), weight 76 kg, SpO2 97 %.    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 Oral secretions have improved with scopolamine. Lungs: Bronchial breath sounds at both bases posteriorly. No wheezing. Ppeak 39 Pplat 30.  pressure.  Normal mean airway pressure. 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  Continue vent support - Difficult to achieve adequate gas exchange with lung protective strategy. Chest physiotherapy as high airway pressures likely due to increased resistance from airway secretions. Continue bronchodilators. Added mucolytic Attempt to diurese as remains in positive fluid balance for hospitalization and patient looks edematous.  HCAP associated multi-drug resistant bacterial pneumonia. Stenotrophomonas Continue Meropenem pending new culture results.  Possible seizure activity vs myoclonus. Now controled on valproate Continue valproate   Hypernatremia -  due free water insensitive losses Enteral free water.  Metabolic alkalosis from chronic respiratory acidosis.  Appears clinically euvolemic Continue to follow.  Goals of care : Is reportedly GCS 3 at  baseline. Family has been approached multiple times with palliative discussions and prefers to keep Full Code despite very poor long term prognosis.   Best practice:  Diet: TF via PEG tube. Pain/Anxiety/Delirium protocol (if indicated): PRN VAP protocol (if indicated): bundle ordered. DVT prophylaxis: Heparin SQ GI prophylaxis: Protonix Glucose control: n/a Mobility: BR Code Status: FULL Family Communication: [name] updated 4/27.  Disposition: ICU  Labs   CBC: Recent Labs  Lab 03/19/19 1757 03/19/19 1807 10/26/18 0356 10/27/18 0205  WBC 19.3*  --  14.1* 12.7*  NEUTROABS 16.4*  --   --   --   HGB 7.9* 11.6* 7.5* 7.4*  HCT 30.3* 34.0* 28.1* 28.4*  MCV 82.3  --  81.0 83.5  PLT 476*  --  465* 426*    Basic Metabolic Panel: Recent Labs  Lab 10/26/18 0356 10/27/18 0205 10/27/18 1339 10/28/18 0224 10/29/18 0428  NA 145 148* 151* 155* 155*  K 3.8 3.2* 4.2 4.1 4.0  CL 102 107 108 112* 111  CO2 35* 35* 35* 37* 38*  GLUCOSE 120* 126* 157* 133* 137*  BUN 15 20 21* 22* 25*  CREATININE 0.47 0.46 0.43* 0.45 0.59  CALCIUM 9.6 8.8* 9.3 9.1 8.8*  MG 1.9 1.8  --   --   --   PHOS 1.4* 5.7*  --   --   --    GFR: Estimated Creatinine Clearance: 109.6 mL/min (by C-G formula based on SCr of 0.59 mg/dL). Recent Labs  Lab 03/19/19 1757 10/26/18 0356 10/27/18 0205  WBC 19.3* 14.1* 12.7*  LATICACIDVEN  --  1.9  --     Liver Function Tests: No results for input(s): AST, ALT, ALKPHOS, BILITOT, PROT, ALBUMIN in the last 168 hours. No results for input(s): LIPASE, AMYLASE in the last 168 hours. No results for input(s): AMMONIA in the last 168 hours.  ABG    Component Value Date/Time   PHART 7.397 10/27/2018 0425   PCO2ART 55.1 (H) 10/27/2018 0425   PO2ART 46.4 (L) 10/27/2018 0425   HCO3 33.4 (H) 10/27/2018 0425   TCO2 43 (H) 03/19/2019 1807   O2SAT 80.6 10/27/2018 0425     Coagulation Profile: No results for input(s): INR, PROTIME in the last 168 hours.  Cardiac  Enzymes: No results for input(s): CKTOTAL, CKMB, CKMBINDEX, TROPONINI in the last 168 hours.  HbA1C: No results found for: HGBA1C  CBG: Recent Labs  Lab 10/28/18 2354 10/29/18 0400 10/29/18 0740 10/29/18 1130 10/29/18 1514  GLUCAP 159* 120* 152* 124* 141*   The patient is critically ill with multiple organ system failure and requires high complexity decision making for assessment and support, frequent evaluation and titration of therapies, advanced monitoring, review of radiographic studies and interpretation of complex data.   Critical Care Time devoted to patient care services, exclusive of separately billable procedures, described in this note is 40 minutes.   Lynnell Catalanavi Natnael Biederman, MD [facility] ICU Physician [facility] Critical Care  Pager: [phone] Mobile: [phone] After hours: [phone].  10/29/2018, 5:16 PM

## 2018-10-29 NOTE — TOC Progression Note (Addendum)
Transition of Care [facility]) - Progression Note    Patient Details  Name: Kathy Buckley MRN: [id number] Date of Birth: February 21, 1991  Transition of Care [facility]) CM/SW Contact  Cherylann Parr, RN Phone Number: 10/29/2018, 8:40 AM  Clinical Narrative:  Pt is from [facility].   Pt remains on vent via trach (baseline PTA), pt on pressors.  CSW in contact regarding SNF vent bed at [facility].   CM will continue to follow     Expected Discharge Plan: Skilled Nursing Facility    Expected Discharge Plan and Services Expected Discharge Plan: Skilled Nursing Facility       Living arrangements for the past 2 months: Skilled Nursing Facility                                       Social Determinants of Health (SDOH) Interventions    Readmission Risk Interventions No flowsheet data found.

## 2018-10-30 ENCOUNTER — Inpatient Hospital Stay (HOSPITAL_COMMUNITY): Payer: Medicaid Other

## 2018-10-30 DIAGNOSIS — J80 Acute respiratory distress syndrome: Secondary | ICD-10-CM

## 2018-10-30 DIAGNOSIS — J9611 Chronic respiratory failure with hypoxia: Secondary | ICD-10-CM

## 2018-10-30 LAB — GLUCOSE, CAPILLARY
Glucose-Capillary: 130 mg/dL — ABNORMAL HIGH (ref 70–99)
Glucose-Capillary: 134 mg/dL — ABNORMAL HIGH (ref 70–99)
Glucose-Capillary: 137 mg/dL — ABNORMAL HIGH (ref 70–99)
Glucose-Capillary: 141 mg/dL — ABNORMAL HIGH (ref 70–99)
Glucose-Capillary: 158 mg/dL — ABNORMAL HIGH (ref 70–99)
Glucose-Capillary: 162 mg/dL — ABNORMAL HIGH (ref 70–99)

## 2018-10-30 LAB — CULTURE, BLOOD (ROUTINE X 2)
Culture: NO GROWTH
Culture: NO GROWTH

## 2018-10-30 LAB — CBC WITH DIFFERENTIAL/PLATELET
Abs Immature Granulocytes: 0.28 10*3/uL — ABNORMAL HIGH (ref 0.00–0.07)
Basophils Absolute: 0.1 10*3/uL (ref 0.0–0.1)
Basophils Relative: 0 %
Eosinophils Absolute: 0.2 10*3/uL (ref 0.0–0.5)
Eosinophils Relative: 1 %
HCT: 29.5 % — ABNORMAL LOW (ref 36.0–46.0)
Hemoglobin: 7.3 g/dL — ABNORMAL LOW (ref 12.0–15.0)
Immature Granulocytes: 1 %
Lymphocytes Relative: 11 %
Lymphs Abs: 2.9 10*3/uL (ref 0.7–4.0)
MCH: 21.2 pg — ABNORMAL LOW (ref 26.0–34.0)
MCHC: 24.7 g/dL — ABNORMAL LOW (ref 30.0–36.0)
MCV: 85.8 fL (ref 80.0–100.0)
Monocytes Absolute: 2.2 10*3/uL — ABNORMAL HIGH (ref 0.1–1.0)
Monocytes Relative: 8 %
Neutro Abs: 21.5 10*3/uL — ABNORMAL HIGH (ref 1.7–7.7)
Neutrophils Relative %: 79 %
Platelets: 330 10*3/uL (ref 150–400)
RBC: 3.44 MIL/uL — ABNORMAL LOW (ref 3.87–5.11)
RDW: 27.4 % — ABNORMAL HIGH (ref 11.5–15.5)
WBC: 27.2 10*3/uL — ABNORMAL HIGH (ref 4.0–10.5)
nRBC: 0.5 % — ABNORMAL HIGH (ref 0.0–0.2)

## 2018-10-30 LAB — BASIC METABOLIC PANEL
Anion gap: 7 (ref 5–15)
BUN: 26 mg/dL — ABNORMAL HIGH (ref 6–20)
CO2: 38 mmol/L — ABNORMAL HIGH (ref 22–32)
Calcium: 8.7 mg/dL — ABNORMAL LOW (ref 8.9–10.3)
Chloride: 107 mmol/L (ref 98–111)
Creatinine, Ser: 0.5 mg/dL (ref 0.44–1.00)
GFR calc Af Amer: >60 mL/min (ref >60)
GFR calc non Af Amer: >60 mL/min (ref >60)
Glucose, Bld: 130 mg/dL — ABNORMAL HIGH (ref 70–99)
Potassium: 3.7 mmol/L (ref 3.5–5.1)
Sodium: 152 mmol/L — ABNORMAL HIGH (ref 135–145)

## 2018-10-30 MED ORDER — SULFAMETHOXAZOLE-TRIMETHOPRIM 800-160 MG PO TABS
2.0000 | ORAL_TABLET | Freq: Three times a day (TID) | ORAL | Status: DC
  Administered 2018-10-30 – 2018-11-01 (×7): 2
  Filled 2018-10-30 (×9): qty 2

## 2018-10-30 MED ORDER — SODIUM CHLORIDE 0.9 % IV SOLN
1.0000 g | Freq: Three times a day (TID) | INTRAVENOUS | Status: DC
  Filled 2018-10-30 (×3): qty 1

## 2018-10-30 MED ORDER — SODIUM CHLORIDE 0.9 % IV BOLUS
1000.0000 mL | Freq: Once | INTRAVENOUS | Status: AC
  Administered 2018-10-30: 1000 mL via INTRAVENOUS

## 2018-10-30 MED ORDER — ACETAMINOPHEN 325 MG PO TABS
650.0000 mg | ORAL_TABLET | Freq: Four times a day (QID) | ORAL | Status: DC | PRN
  Administered 2018-10-30 – 2018-11-11 (×4): 650 mg via ORAL
  Filled 2018-10-30 (×4): qty 2

## 2018-10-30 MED ORDER — SODIUM CHLORIDE 0.9 % IV SOLN
2.0000 g | Freq: Three times a day (TID) | INTRAVENOUS | Status: DC
  Administered 2018-10-30 – 2018-10-31 (×3): 2 g via INTRAVENOUS
  Filled 2018-10-30 (×7): qty 2

## 2018-10-30 NOTE — Progress Notes (Signed)
eLink Physician-Brief Progress Note Patient Name: Kathy Buckley DOB: 11-04-1990 MRN: [id number]   Date of Service  10/30/2018  HPI/Events of Note  Sinus Tachycardia - HR = 128. Not spiking a fever according to bedside nurse.   eICU Interventions  Will order: 1. Bolus with 0.9 NaCl 1 liter IV over 1 hour now.  2. CBC with Platelets at 5 AM.      Intervention Category Major Interventions: Arrhythmia - evaluation and management  Sommer,Steven Eugene 10/30/2018, 2:57 AM

## 2018-10-30 NOTE — Progress Notes (Addendum)
NAME:  Kathy Buckley, MRN:  [id number], DOB:  20-Jul-1990, LOS: 5 ADMISSION DATE:  10/26/2018, CONSULTATION DATE: October 25, 2018 REFERRING MD: Dr. Juleen ChinaKohut, CHIEF COMPLAINT: Hypoxia  Brief History   28 year old female requiring chronic vent secondary to anoxic injury in 2019.  Recently admitted for pneumonia and returning from [facility] for worsening vent needs.  She resides at [facility] with baseline GCS is 3.  [facility] was [facility].  She was ruled out for COVID-19 at that time with a negative nasopharyngeal swab.  Pneumonia was presumed to be bacterial and she was treated with cefepime, Flagyl, and vancomycin. ( Fever and tachycardia, which were the reasons for her presentation) had resolved and she was transferred back to [facility] the following day on 4/20.  Tracheal aspirate taken while inpatient was positive for multidrug-resistant Achromobacter.  Repeat aspirate during this admit showed stenotrophomonas Course complicated by seizure episode on 4/26?  Myoclonus  Past Medical History   has a past medical history of Acute respiratory failure (HCC), Anoxic brain damage (HCC), Cardiac arrest (HCC), Central corneal ulcer, left eye, Idiopathic hypotension, Tachyarrhythmia, and Viral hepatitis.  Significant Hospital Events   [facility] 4/20 Transfer to [facility] 4/23 Admit to [facility] for hypoxia  Consults:  N/A  Procedures:  4/25 - tracheostomy change to 8 Portex. 4/25 - bronchoscopy shows small amount white secretions.   Significant Diagnostic Tests:  CXR 4/25 bilateral interstitial infiltrates.  Micro Data:  4/19 trach aspirate >> MDR Achromobacter.  4/25 tracheal aspirate - >100,000 Stenotrophomonas  Antimicrobials:  Cefepime 4/19 > 4/23 Flagyl 4/19 > 4/23 Vancomycin 4/19 > 4/23 Meropenem 4/23 >>>4/26 Bactrim 4/26 >>  Interim history/subjective:  She is critically ill, on 80%/PEEP of 8 On pressure control  ventilation Afebrile Remains unresponsive   Objective   Blood pressure 102/62, pulse (!) 111, temperature 98.9 F (37.2 C), temperature source Axillary, resp. rate (!) 30, height 5\' 5"  (1.651 m), weight 76.7 kg, SpO2 96 %.    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  Pressure control ventilation- Difficult to achieve adequate gas exchange with lung protective strategy.  Achieving tidal volumes in the 300 range with pressure control of 30 Increase PEEP to 10 but will accept high FiO2 if needed lower respiratory rate  to 25 Chest physiotherapy  Continue bronchodilators. Continue acetylcysteine  HCAP/VAP present on admission associated multi-drug resistant bacterial pneumonia. Stenotrophomonas Continue Bactrim-increase dosing of trimethoprim  Possible seizure  activity vs myoclonus. Now controled on valproate Continue valproate   Hypernatremia - due free water insensitive losses Enteral free water. Hold diuretic  Metabolic alkalosis from chronic respiratory acidosis.  Appears clinically euvolemic Continue to follow.  Goals of care : Is reportedly GCS 3 at baseline. Family has been approached multiple times with palliative discussions and prefers to keep Full Code despite very poor long term prognosis.   Best practice:  Diet: TF via PEG tube. Pain/Anxiety/Delirium protocol (if indicated): PRN VAP protocol (if indicated): bundle ordered. DVT prophylaxis: Heparin SQ GI prophylaxis: Protonix Glucose control: n/a Mobility: BR Code Status: FULL Family Communication: Mom Cala Bradford updated 4/27.  Disposition: ICU    The patient is critically ill with multiple organ systems failure and requires high complexity decision making for assessment and support, frequent evaluation and titration of therapies, application of advanced monitoring technologies and extensive interpretation of multiple databases. Critical Care Time devoted to patient care services described in this note independent of APP/resident  time is 32 minutes.   Cyril Mourning MD. Tonny Bollman. [place] [facility] Pager [phone] If no response call [phone]     10/30/2018, 9:03 AM

## 2018-10-30 NOTE — Progress Notes (Addendum)
Pharmacy Antibiotic Note  Kathy Buckley is a 28 y.o. female admitted on 10/28/2018 with pneumonia.  Pharmacy has been consulted for cefepime dosing.  Chronic vent patient with recent admission for pna, trach aspirate Cx with achromobacter xylosoxidans, imipenem sens (10/21/2018).    Patient also cultured 40,000 colonies/ml of pseudomonas and stenotrophomonas from her BAL on 4/25.   Patient has an increasing WBC of 27.2, afebrile. SCr 0.5, CrCl >100 ml/min.    Plan: Cefepime 2g IV q 8 hours DS Bactrim 2 tablets via tube TID (~12.6 mg/kg of trimethoprim) for 14 days  Monitor renal fx, LOT, sensitives of pseudomonas   Addendum: Pseudomonas culture resulted intermediate to cefepime and resistant to imipenem. Sensitive to ceftazidime and Zosyn.  Will change to ceftazidime 2g q 8 hours for 7 days.   Pharmacy will sign off and continue to monitor patient for any dose changes needed due to renal function.   Height: _0  (165.1 cm) Weight: 169 lb 1.5 oz (76.7 kg) IBW/kg (Calculated) : 57  Temp (24hrs), Avg:98.8 F (37.1 C), Min:98.5 F (36.9 C), Max:99 F (37.2 C)  Recent Labs  Lab 10/30/2018 1757 10/26/18 0356 10/27/18 0205 10/27/18 1339 10/28/18 0224 10/29/18 0428 10/30/18 0342  WBC 19.3* 14.1* 12.7*  --   --   --  27.2*  CREATININE 0.55 0.47 0.46 0.43* 0.45 0.59 0.50  LATICACIDVEN  --  1.9  --   --   --   --   --     Antimicrobials this admission: 4/23 Merrem >>4/26 4/26 Bactrim >> (5/11) 4/28 Cefepime>>4/29 4/29 Ceftazidime >> (5/5)   Microbiology results: 4/25 BAL: stenotrophomonas maltophilia, pseudomonas  4/25 MRSA: neg  4/23 BCx: ngtd 4/23 UCx: yeast  4/19 trach asp: achromobacter xylosoxidans; sens to imipenem  4/19 COVID: neg  4/19 bcx: NGTD   Azzie Roup D PGY1 Pharmacy Resident  Phone [phone] Please use AMION for clinical pharmacists numbers  10/30/2018      1:21 PM

## 2018-10-30 NOTE — Procedures (Signed)
[facility] Kathy Buckley A. Kathy Pilgrimoonquah, MD     [email or web address]           HISTORY: This is a 28 year old female who has a history of anoxic brain injury.  She presents again to the hospital with recurrent respiratory issues.  The studies been done to evaluate for brain function and ongoing nonconvulsive seizure activity.  MEDICATIONS:  Current Facility-Administered Medications:  .  0.9 %  sodium chloride infusion, , Intravenous, PRN, Scatliffe, Kathy BalsamKristen D, MD, Stopped at 10/30/18 0411 .  acetaminophen (TYLENOL) tablet 650 mg, 650 mg, Oral, Q6H PRN, Oretha MilchAlva, Kathy V, MD, 650 mg at 10/30/18 1848 .  acetylcysteine (MUCOMYST) 20 % nebulizer / oral solution 2 mL, 2 mL, Nebulization, Q4H, Kathy Buckley, Kathy Buckley, RPH, 2 mL at 10/30/18 1922 .  albuterol (PROVENTIL) (2.5 MG/3ML) 0.083% nebulizer solution 2.5 mg, 2.5 mg, Nebulization, Q2H PRN, Agarwala, Ravi, MD .  ceFEPIme (MAXIPIME) 2 g in sodium chloride 0.9 % 100 mL IVPB, 2 g, Intravenous, Q8H, Kathy Buckley, Kathy V, MD, Last Rate: 200 mL/hr at 10/30/18 1637, 2 g at 10/30/18 1637 .  feeding supplement (OSMOLITE 1.2 CAL) liquid 1,000 mL, 1,000 mL, Per Tube, Q24H, Kathy Buckley, Praveen, MD, Last Rate: 40 mL/hr at 10/30/18 1334, 1,000 mL at 10/30/18 1334 .  feeding supplement (PRO-STAT SUGAR FREE 64) liquid 60 mL, 60 mL, Per Tube, BID, Kathy Buckley, Praveen, MD, 60 mL at 10/30/18 0939 .  free water 400 mL, 400 mL, Per Tube, Q4H, Agarwala, Ravi, MD, 400 mL at 10/30/18 1605 .  heparin injection 5,000 Units, 5,000 Units, Subcutaneous, Q8H, Kathy Buckley, Kathy W, NP, 5,000 Units at 10/30/18 1333 .  insulin aspart (novoLOG) injection 0-15 Units, 0-15 Units, Subcutaneous, Q4H, Kathy Buckley, Dorise HissElizabeth C, MD, 2 Units at 10/30/18 1605 .  ipratropium-albuterol (DUONEB) 0.5-2.5 (3) MG/3ML nebulizer solution 3 mL, 3 mL, Nebulization, Q4H, Agarwala, Ravi, MD, 3 mL at 10/30/18 1922 .  MEDLINE mouth rinse, 15 mL, Mouth Rinse, 10 times per day, Scatliffe, Kathy D, MD, 15 mL at 10/30/18 1753  .  norepinephrine (LEVOPHED) 4mg  in 250mL premix infusion, 0-40 mcg/min, Intravenous, Titrated, Kathy Buckley, Kathy Buckley U, MD, Stopped at 10/29/18 1123 .  pantoprazole sodium (PROTONIX) 40 mg/20 mL oral suspension 40 mg, 40 mg, Per Tube, Daily, Kathy Buckley, Praveen, MD, 40 mg at 10/29/18 2017 .  scopolamine (TRANSDERM-SCOP) 1 MG/3DAYS 1.5 mg, 1 patch, Transdermal, Q72H, Agarwala, Ravi, MD, 1.5 mg at 10/30/18 0945 .  sodium chloride flush (NS) 0.9 % injection 10-40 mL, 10-40 mL, Intracatheter, Q12H, Kathy Buckley, Praveen, MD, 10 mL at 10/30/18 0942 .  sodium chloride flush (NS) 0.9 % injection 10-40 mL, 10-40 mL, Intracatheter, PRN, Kathy Buckley, Praveen, MD .  sulfamethoxazole-trimethoprim (BACTRIM DS) 800-160 MG per tablet 2 tablet, 2 tablet, Per Tube, Q8H, Oretha MilchAlva, Kathy V, MD, 2 tablet at 10/30/18 1638 .  valproic acid (DEPAKENE) solution 500 mg, 500 mg, Per Tube, TID, Agarwala, Ravi, MD, 500 mg at 10/30/18 1333     ANALYSIS: A 16 channel recording using standard 10 20 measurements is conducted for 21 minutes.  There is profound voltage suppression with the gain having to be turned up to 1.5 uV/mm for EEG activity to be noted.  The background activity is that of a theta frequency with a maximum of 6 Hz.  There is a brief time when sternal rub elicited a higher activity of 9 Hz bilaterally.  Focal or lateral slowing is not observed.  No epileptiform activities are observed.  Photic stimulation is not conducted.   IMPRESSION: 1.  This recording  shows severe voltage suppression and background slowing indicating a severe encephalopathy.  No epileptiform discharges are observed.      Kathy Buckley A. Kathy Buckley, M.Buckley.  Diplomate, Biomedical engineer of Psychiatry and Neurology ( Neurology).

## 2018-10-30 NOTE — Progress Notes (Signed)
Patient's Spo2 decreased to 88%. RN called RT to assess. RT gave patient scheduled breathing treatments and did CPT for 10 min. Patient's sats remained at 88%. RT increased fio2 to 100% to maintains sats 92% or greater. RT will wean Fio2 as tolerated.

## 2018-10-30 NOTE — Progress Notes (Signed)
EEG Completed; Results Pending  

## 2018-10-31 ENCOUNTER — Inpatient Hospital Stay (HOSPITAL_COMMUNITY): Payer: Medicaid Other

## 2018-10-31 DIAGNOSIS — Z930 Tracheostomy status: Secondary | ICD-10-CM

## 2018-10-31 LAB — CBC
HCT: 29.1 % — ABNORMAL LOW (ref 36.0–46.0)
Hemoglobin: 7.3 g/dL — ABNORMAL LOW (ref 12.0–15.0)
MCH: 22.2 pg — ABNORMAL LOW (ref 26.0–34.0)
MCHC: 25.1 g/dL — ABNORMAL LOW (ref 30.0–36.0)
MCV: 88.4 fL (ref 80.0–100.0)
Platelets: 282 10*3/uL (ref 150–400)
RBC: 3.29 MIL/uL — ABNORMAL LOW (ref 3.87–5.11)
RDW: 27.6 % — ABNORMAL HIGH (ref 11.5–15.5)
WBC: 24.2 10*3/uL — ABNORMAL HIGH (ref 4.0–10.5)
nRBC: 1.2 % — ABNORMAL HIGH (ref 0.0–0.2)

## 2018-10-31 LAB — GLUCOSE, CAPILLARY
Glucose-Capillary: 127 mg/dL — ABNORMAL HIGH (ref 70–99)
Glucose-Capillary: 154 mg/dL — ABNORMAL HIGH (ref 70–99)
Glucose-Capillary: 107 mg/dL — ABNORMAL HIGH (ref 70–99)
Glucose-Capillary: 103 mg/dL — ABNORMAL HIGH (ref 70–99)
Glucose-Capillary: 103 mg/dL — ABNORMAL HIGH (ref 70–99)
Glucose-Capillary: 119 mg/dL — ABNORMAL HIGH (ref 70–99)

## 2018-10-31 LAB — CULTURE, BAL-QUANTITATIVE W GRAM STAIN
Culture: >=100000 — AB
Special Requests: NORMAL

## 2018-10-31 LAB — BASIC METABOLIC PANEL
Anion gap: 6 (ref 5–15)
BUN: 30 mg/dL — ABNORMAL HIGH (ref 6–20)
CO2: 39 mmol/L — ABNORMAL HIGH (ref 22–32)
Calcium: 8.8 mg/dL — ABNORMAL LOW (ref 8.9–10.3)
Chloride: 106 mmol/L (ref 98–111)
Creatinine, Ser: 0.58 mg/dL (ref 0.44–1.00)
GFR calc Af Amer: >60 mL/min (ref >60)
GFR calc non Af Amer: >60 mL/min (ref >60)
Glucose, Bld: 122 mg/dL — ABNORMAL HIGH (ref 70–99)
Potassium: 4.5 mmol/L (ref 3.5–5.1)
Sodium: 151 mmol/L — ABNORMAL HIGH (ref 135–145)

## 2018-10-31 LAB — CULTURE, BAL-QUANTITATIVE

## 2018-10-31 LAB — PHOSPHORUS: Phosphorus: 3.6 mg/dL (ref 2.5–4.6)

## 2018-10-31 LAB — MAGNESIUM: Magnesium: 2.3 mg/dL (ref 1.7–2.4)

## 2018-10-31 MED ORDER — FUROSEMIDE 10 MG/ML PO SOLN
40.0000 mg | Freq: Every day | ORAL | Status: DC
  Administered 2018-10-31 – 2018-11-05 (×6): 40 mg
  Filled 2018-10-31 (×6): qty 4

## 2018-10-31 MED ORDER — FUROSEMIDE 8 MG/ML PO SOLN
40.0000 mg | Freq: Every day | ORAL | Status: DC
  Filled 2018-10-31: qty 5

## 2018-10-31 MED ORDER — SODIUM CHLORIDE 0.9 % IV SOLN
2.0000 g | Freq: Three times a day (TID) | INTRAVENOUS | Status: DC
  Administered 2018-10-31 – 2018-11-07 (×23): 2 g via INTRAVENOUS
  Filled 2018-10-31 (×28): qty 2

## 2018-10-31 NOTE — Progress Notes (Signed)
eLink Physician-Brief Progress Note Patient Name: Kathy Buckley DOB: 1991/04/18 MRN: [id number]   Date of Service  10/31/2018  HPI/Events of Note  Bedside RN requesting AM labs order  eICU Interventions  AM labs ordered.        Thomasene Lot Kareen Jefferys 10/31/2018, 11:32 PM

## 2018-10-31 NOTE — Progress Notes (Signed)
NAME:  Kathy Buckley, MRN:  [id number], DOB:  10/26/1990, LOS: 6 ADMISSION DATE:  06-Nov-2018, CONSULTATION DATE: 11/06/2018 REFERRING MD: Dr. 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  Recently admitted for pneumonia and returning from [facility] for worsening vent needs.  She resides at [facility] with baseline GCS is 3.  She was recently admitted on 4/19 to [facility] with worsening oxygenation.  She was ruled out for COVID-19 at that time with a negative nasopharyngeal swab.  Pneumonia was presumed to be bacterial and she was treated with cefepime, Flagyl, and vancomycin. ( Fever and tachycardia, which were the reasons for her presentation) had resolved and she was transferred back to [facility] the following day on 4/20.  Tracheal aspirate taken while inpatient was positive for multidrug-resistant Achromobacter.  Repeat aspirate during this admit showed stenotrophomonas Course complicated by seizure episode on 4/26?  Myoclonus  Past Medical History   has a past medical history of Acute respiratory failure (HCC), Anoxic brain damage (HCC), Cardiac arrest (HCC), Central corneal ulcer, left eye, Idiopathic hypotension, Tachyarrhythmia, and Viral hepatitis.  Significant Hospital Events   4/19 admit to [facility] for PNA 4/20 Transfer to [facility] 4/23 Admit to [facility] for hypoxia 4/28 fever 101 , PEEP increased to 10  Consults:  N/A  Procedures:  4/25 - tracheostomy change to 8 Portex. 4/25 - bronchoscopy shows small amount white secretions.   Significant Diagnostic Tests:  EEG 4/28 >> neg  Micro Data:  4/19 trach aspirate >> MDR Achromobacter.  4/25 tracheal aspirate - >100,000 Stenotrophomonas, 40 k pseudomonas  Antimicrobials:  Cefepime 4/19 > 4/23 Flagyl 4/19 > 4/23 Vancomycin 4/19 > 4/23 Meropenem 4/23 >>>4/26 Bactrim 4/26 >> ceftax 4/29 >>  Interim history/subjective:  She was febrile  yesterday Critically ill, on 80%/PEEP of 10   Objective   Blood pressure (!) 88/53, pulse (!) 106, temperature 98.5 F (36.9 C), temperature source Oral, resp. rate (!) 25, height 5\' 5"  (1.651 m), weight 75.7 kg, SpO2 98 %. 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  Lungs: Synchronous with vent, decreased breath sounds bilateral  CV:   Tachycardic, regular rhythm; no murmurs Abd:  + bowel sounds; soft, non-tender; no palpable masses, no distension Ext:  No edema; adequate peripheral perfusion Skin:  Warm and dry; no rash Neuro: Withdrawal with painful stimuli, GCS 3 Plus edema  Chest x-ray 4/29 was personally reviewed which shows stable bilateral airspace disease predominantly right lower and left upper with improved aeration left lower lobe  Labs show mild decrease in leukocytosis and hypernatremia , stable anemia   Resolved Hospital Problem list   Septic shock -   Assessment & Plan:   Critically ill due to 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  Ct Pressure control ventilation-  Achieving tidal  volumes in the 300 range with pressure control of 30 Keep PEEP to 10 , drop FiO2 as able Chest physiotherapy  Continue bronchodilators. Continue acetylcysteine  HCAP/VAP present on admission associated multi-drug resistant bacterial pneumonia. Stenotrophomonas Given fever on 4/28 we will treat Pseudomonas Continue  Bactrim Add ceftaz for 7 days  Possible seizure activity vs myoclonus. EEG neg, this occurred while on meropenem Continue valproate , unsure she needs this long-term  Hypernatremia - due free water insensitive losses Enteral free water. Lasix 40 mg  Metabolic alkalosis from chronic respiratory acidosis.  Appears clinically euvolemic Continue to follow.  Goals of care : Is reportedly GCS 3 at baseline. Family has been approached multiple times with palliative discussions and prefers to keep Full Code despite very poor long term prognosis.  Once she remains afebrile confirming that we are on right treatment for H CAP and oxygenation improves, [facility] can [facility]  Best practice:  Diet: TF via PEG tube. Pain/Anxiety/Delirium protocol (if indicated): PRN VAP protocol (if indicated): bundle ordered. DVT prophylaxis: Heparin SQ GI prophylaxis: Protonix Glucose control: n/a Mobility: BR Code Status: FULL Family Communication: [name] updated 4/29.  Disposition: ICU   The patient is critically ill with multiple organ systems failure and requires high complexity decision making for assessment and support, frequent evaluation and titration of therapies, application of advanced monitoring technologies and extensive interpretation of multiple databases. Critical Care Time devoted to patient care services described in this note independent of APP/resident  time is 31 minutes.    Cyril Mourningakesh Pranika Finks MD. Tonny BollmanFCCP. [place] [facility] Pager [phone] If no response call [phone]     10/31/2018, 8:52 AM

## 2018-11-01 ENCOUNTER — Inpatient Hospital Stay (HOSPITAL_COMMUNITY): Payer: Medicaid Other

## 2018-11-01 LAB — BASIC METABOLIC PANEL
Anion gap: 8 (ref 5–15)
BUN: 27 mg/dL — ABNORMAL HIGH (ref 6–20)
CO2: 40 mmol/L — ABNORMAL HIGH (ref 22–32)
Calcium: 8.7 mg/dL — ABNORMAL LOW (ref 8.9–10.3)
Chloride: 102 mmol/L (ref 98–111)
Creatinine, Ser: 0.57 mg/dL (ref 0.44–1.00)
GFR calc Af Amer: >60 mL/min (ref >60)
GFR calc non Af Amer: >60 mL/min (ref >60)
Glucose, Bld: 111 mg/dL — ABNORMAL HIGH (ref 70–99)
Potassium: 3.6 mmol/L (ref 3.5–5.1)
Sodium: 150 mmol/L — ABNORMAL HIGH (ref 135–145)

## 2018-11-01 LAB — CBC WITH DIFFERENTIAL/PLATELET
Abs Immature Granulocytes: 0.21 10*3/uL — ABNORMAL HIGH (ref 0.00–0.07)
Basophils Absolute: 0.1 10*3/uL (ref 0.0–0.1)
Basophils Relative: 0 %
Eosinophils Absolute: 0.3 10*3/uL (ref 0.0–0.5)
Eosinophils Relative: 2 %
HCT: 26.8 % — ABNORMAL LOW (ref 36.0–46.0)
Hemoglobin: 7 g/dL — ABNORMAL LOW (ref 12.0–15.0)
Immature Granulocytes: 1 %
Lymphocytes Relative: 13 %
Lymphs Abs: 2.4 10*3/uL (ref 0.7–4.0)
MCH: 22.1 pg — ABNORMAL LOW (ref 26.0–34.0)
MCHC: 26.1 g/dL — ABNORMAL LOW (ref 30.0–36.0)
MCV: 84.5 fL (ref 80.0–100.0)
Monocytes Absolute: 1.7 10*3/uL — ABNORMAL HIGH (ref 0.1–1.0)
Monocytes Relative: 9 %
Neutro Abs: 14.4 10*3/uL — ABNORMAL HIGH (ref 1.7–7.7)
Neutrophils Relative %: 75 %
Platelets: 263 10*3/uL (ref 150–400)
RBC: 3.17 MIL/uL — ABNORMAL LOW (ref 3.87–5.11)
RDW: 27.3 % — ABNORMAL HIGH (ref 11.5–15.5)
WBC: 19 10*3/uL — ABNORMAL HIGH (ref 4.0–10.5)
nRBC: 0.8 % — ABNORMAL HIGH (ref 0.0–0.2)

## 2018-11-01 LAB — GLUCOSE, CAPILLARY
Glucose-Capillary: 122 mg/dL — ABNORMAL HIGH (ref 70–99)
Glucose-Capillary: 102 mg/dL — ABNORMAL HIGH (ref 70–99)
Glucose-Capillary: 108 mg/dL — ABNORMAL HIGH (ref 70–99)
Glucose-Capillary: 76 mg/dL (ref 70–99)
Glucose-Capillary: 147 mg/dL — ABNORMAL HIGH (ref 70–99)
Glucose-Capillary: 188 mg/dL — ABNORMAL HIGH (ref 70–99)

## 2018-11-01 LAB — PREPARE RBC (CROSSMATCH)

## 2018-11-01 LAB — HEMOGLOBIN AND HEMATOCRIT, BLOOD
HCT: 28.9 % — ABNORMAL LOW (ref 36.0–46.0)
Hemoglobin: 8.1 g/dL — ABNORMAL LOW (ref 12.0–15.0)

## 2018-11-01 MED ORDER — FAMOTIDINE 40 MG/5ML PO SUSR
20.0000 mg | Freq: Two times a day (BID) | ORAL | Status: DC
  Administered 2018-11-01: 20 mg
  Filled 2018-11-01 (×3): qty 2.5

## 2018-11-01 MED ORDER — FAMOTIDINE IN NACL 20-0.9 MG/50ML-% IV SOLN
20.0000 mg | Freq: Two times a day (BID) | INTRAVENOUS | Status: DC
  Administered 2018-11-01 – 2018-11-08 (×13): 20 mg via INTRAVENOUS
  Filled 2018-11-01 (×16): qty 50

## 2018-11-01 MED ORDER — DEXTROSE 10 % IV SOLN
INTRAVENOUS | Status: DC
  Administered 2018-11-01: 16:00:00 via INTRAVENOUS

## 2018-11-01 MED ORDER — DEXTROSE 50 % IV SOLN
25.0000 mL | Freq: Once | INTRAVENOUS | Status: AC
  Administered 2018-11-01: 25 mL via INTRAVENOUS

## 2018-11-01 MED ORDER — DEXTROSE 50 % IV SOLN
INTRAVENOUS | Status: AC
  Filled 2018-11-01: qty 50

## 2018-11-01 MED ORDER — SULFAMETHOXAZOLE-TRIMETHOPRIM 400-80 MG/5ML IV SOLN
15.0000 mg/kg/d | Freq: Three times a day (TID) | INTRAVENOUS | Status: AC
  Administered 2018-11-01 – 2018-11-12 (×34): 386.08 mg via INTRAVENOUS
  Filled 2018-11-01 (×37): qty 24.13

## 2018-11-01 MED ORDER — SODIUM CHLORIDE 0.9% IV SOLUTION
Freq: Once | INTRAVENOUS | Status: AC
  Administered 2018-11-01: 12:00:00 via INTRAVENOUS

## 2018-11-01 NOTE — Progress Notes (Addendum)
Nutrition Follow-up RD working remotely.  DOCUMENTATION CODES:   Not applicable  INTERVENTION:    Continue TF via G-tube: Osmolite 1.2 at 40 ml/h with Pro-stat 60 ml BID to provide 1552 kcal, 113 gm protein, 787 ml free water daily.  NUTRITION DIAGNOSIS:   Inadequate oral intake related to inability to eat as evidenced by NPO status.  Ongoing  GOAL:   Patient will meet greater than or equal to 90% of their needs  Met with TF  MONITOR:   TF tolerance, Labs, Skin, I & O's  ASSESSMENT:   28 yo female with PMH of anoxic brain injury in 2019, chronic trach/vent, G-tube, resident at [facility] who was admitted with hypoxia. COVID-19 negative.  Patient remains on chronic vent support. MV: 6.9 L/min Temp (24hrs), Avg:98.4 F (36.9 C), Min:97.6 F (36.4 C), Max:99.4 F (37.4 C)   Labs reviewed. Sodium 150 (H); receiving 400 ml free water flushes every 4 hours. CBG's: 122-102 Medications reviewed and include Novolog and Lasix.  Weight stable.   Tolerating TF via G-tube without difficulty.   Plans for D/C back to [facility] when bed available.  Diet Order:   Diet Order            Diet NPO time specified  Diet effective now              EDUCATION NEEDS:   No education needs have been identified at this time  Skin:  Skin Assessment: Reviewed RN Assessment  Last BM:  4/30 type 7  Height:   Ht Readings from Last 1 Encounters:  10/05/2018 _0  (1.651 m)    Weight:   Wt Readings from Last 1 Encounters:  11/01/18 77.2 kg    Ideal Body Weight:  56.8 kg  BMI:  Body mass index is 28.32 kg/m.  Estimated Nutritional Needs:   Kcal:  5956  Protein:  100-115 gm  Fluid:  1.6 L    Molli Barrows, RD, LDN, [place] Pager [phone] After Hours Pager [phone]

## 2018-11-01 NOTE — Progress Notes (Signed)
NAME:  Kathy Buckley, MRN:  [id number], DOB:  [birth year], LOS: 7 ADMISSION DATE:  10/20/2018, CONSULTATION DATE: October 25, 2018 REFERRING MD: Dr. 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  Recently admitted for pneumonia and returning from [facility] for worsening vent needs.  She resides at [facility] with baseline GCS is 3.  She was recently admitted on 4/19 to [facility] with worsening oxygenation.  She was ruled out for COVID-19 at that time with a negative nasopharyngeal swab.  Pneumonia was presumed to be bacterial and she was treated with cefepime, Flagyl, and vancomycin. ( Fever and tachycardia, which were the reasons for her presentation) had resolved and she was transferred back to [facility] the following day on 4/20.  Tracheal aspirate taken while inpatient was positive for multidrug-resistant Achromobacter.  Repeat aspirate during this admit showed stenotrophomonas Course complicated by seizure episode on 4/26?  Myoclonus  Past Medical History   has a past medical history of Acute respiratory failure (HCC), Anoxic brain damage (HCC), Cardiac arrest (HCC), Central corneal ulcer, left eye, Idiopathic hypotension, Tachyarrhythmia, and Viral hepatitis.  Significant Hospital Events   4/19 admit to [facility] for PNA 4/20 Transfer to [facility] 4/23 Admit to [facility] for hypoxia 4/28 fever 101 , PEEP increased to 10  Consults:  N/A  Procedures:  4/25 - tracheostomy change to 8 Portex. 4/25 - bronchoscopy shows small amount white secretions.   Significant Diagnostic Tests:  EEG 4/28 >> neg  Micro Data:  4/19 trach aspirate >> MDR Achromobacter.  4/25 tracheal aspirate - >100,000 Stenotrophomonas, 40 k pseudomonas  Antimicrobials:  Cefepime 4/19 > 4/23 Flagyl 4/19 > 4/23 Vancomycin 4/19 > 4/23 Meropenem 4/23 >>>4/26 Bactrim 4/26 >> ceftax 4/29 >>  Interim history/subjective:  Afebrile last 24  hours She remains on PEEP of 10, FiO2 down to 60% Urine output improved with Lasix   Objective   Blood pressure (!) 90/58, pulse 95, temperature 97.6 F (36.4 C), temperature source Oral, resp. rate (!) 25, height 5\' 5"  (1.651 m), weight 77.2 kg, SpO2 95 %.    Vent Mode: PCV FiO2 (%):  [60 %-80 %] 60 % Set Rate:  [25 bmp] 25 bmp PEEP:  [10 cmH20] 10 cmH20 Plateau Pressure:  [31 cmH20-39 cmH20] 31 cmH20   Intake/Output Summary (Last 24 hours) at 11/01/2018 0850 Last data filed at 11/01/2018 0600 Gross per 24 hour  Intake 1163.62 ml  Output 1710 ml  Net -546.38 ml   Filed Weights   10/30/18 0310 10/31/18 0415 11/01/18 0500  Weight: 76.7 kg 75.7 kg 77.2 kg    Examination: Gen: Chronically ill-appearing young woman, well built, no distress HEENT: Tracheostomy in place with large stoma.  Lungs: Synchronous with vent, decreased breath sounds bilateral  CV:   regular rate and rhythm; no murmurs Abd:  + bowel sounds; soft, non-tender; no palpable masses, no distension Ext:  No edema; adequate peripheral perfusion Skin:  Warm and dry; no rash Neuro: Withdrawal to painful stimuli, GCS 3 Plus edema  Chest x-ray 4/29  personally reviewed which shows stable bilateral airspace disease predominantly right lower and left upper with improved aeration left lower lobe  Labs show mild hypernatremia, decreasing leukocytosis mild hypokalemia and worsening anemia  Resolved Hospital Problem list   Septic shock -   Assessment & Plan:   Critically ill due to 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  Ct Pressure control  ventilation-  Achieving tidal volumes in the 300 range with pressure control of 30 Keep PEEP to 10 , drop FiO2 as able Chest physiotherapy  Continue bronchodilators. Continue acetylcysteine  HCAP/VAP present on admission associated multi-drug resistant bacterial pneumonia. Stenotrophomonas Given fever on 4/28 we will treat  Pseudomonas Continue Bactrim x 14 ds total until 5/11 Ct  ceftaz for 7 days until 5/5  Possible seizure activity vs myoclonus. EEG neg, this occurred while on meropenem dc valproate & observe  Hypernatremia - due free water insensitive losses Enteral free water. Lasix 40 mg  Metabolic alkalosis from chronic respiratory acidosis.  Appears clinically euvolemic  Anemia of critical illness-transfuse 1 unit PRBC,goal hemoglobin above 7   Goals of care : Is reportedly GCS 3 at baseline. Family has been approached multiple times with palliative discussions and prefers to keep Full Code despite very poor long term prognosis.  She has been afebrile now 24 hours, if FiO2 comes down to 50% we will [facility]  Best practice:  Diet: TF via PEG tube. Pain/Anxiety/Delirium protocol (if indicated): PRN VAP protocol (if indicated): bundle ordered. DVT prophylaxis: Heparin SQ GI prophylaxis: Protonix Glucose control: n/a Mobility: BR Code Status: FULL Family Communication: [name] updated 4/29.  Disposition: ICU   The patient is critically ill with multiple organ systems failure and requires high complexity decision making for assessment and support, frequent evaluation and titration of therapies, application of advanced monitoring technologies and extensive interpretation of multiple databases. Critical Care Time devoted to patient care services described in this note independent of APP/resident  time is 31 minutes.     Cyril Mourningakesh Alva MD. Tonny BollmanFCCP. [facility] Pager [phone] If no response call [phone]     11/01/2018, 8:50 AM

## 2018-11-01 NOTE — Progress Notes (Signed)
Pt in 65m05 abdomen is very distended. One of the stiches for peg tube is not properly sututred. When pt turned to clean BM, pt vomited fluid, colored the same as meds given through nose and mouth.   Pt mouth and nosed suctioned with Yankauer, Tube feeds were stopped, pt bed position semi fowlers  CCM provider notified, orders to follow

## 2018-11-01 NOTE — Progress Notes (Addendum)
eLink Physician-Brief Progress Note Patient Name: Kathy Buckley DOB: 1990-07-23 MRN: [id number]   Date of Service  11/01/2018  HPI/Events of Note  Pt needs Pepcid switched from po to iv due to GI tract issues.  eICU Interventions  Pepcid changed to 20 mg iv Q 12 hours        Okoronkwo U Ogan 11/01/2018, 8:42 PM

## 2018-11-01 NOTE — Progress Notes (Signed)
Pt in 24m05, 02 sats dropped to low 80s, high 70s. Pt was oral and tracheal suctioned by RN but sats remained the same. CCM provider paged. RT called. Mucous plug removed with BVM, 02 sats now in the mid/high 90s. Will continue to monitor.

## 2018-11-01 NOTE — Care Management (Signed)
Pt is from [facility] - not [facility].  CSW working with [facility] an will facilitate transfer back once bed is available.

## 2018-11-02 ENCOUNTER — Inpatient Hospital Stay (HOSPITAL_COMMUNITY): Payer: Medicaid Other

## 2018-11-02 DIAGNOSIS — J95851 Ventilator associated pneumonia: Secondary | ICD-10-CM

## 2018-11-02 DIAGNOSIS — J80 Acute respiratory distress syndrome: Secondary | ICD-10-CM

## 2018-11-02 LAB — GLUCOSE, CAPILLARY
Glucose-Capillary: 102 mg/dL — ABNORMAL HIGH (ref 70–99)
Glucose-Capillary: 123 mg/dL — ABNORMAL HIGH (ref 70–99)
Glucose-Capillary: 128 mg/dL — ABNORMAL HIGH (ref 70–99)
Glucose-Capillary: 199 mg/dL — ABNORMAL HIGH (ref 70–99)
Glucose-Capillary: 254 mg/dL — ABNORMAL HIGH (ref 70–99)
Glucose-Capillary: 41 mg/dL — CL (ref 70–99)
Glucose-Capillary: 58 mg/dL — ABNORMAL LOW (ref 70–99)
Glucose-Capillary: 67 mg/dL — ABNORMAL LOW (ref 70–99)
Glucose-Capillary: 84 mg/dL (ref 70–99)
Glucose-Capillary: 84 mg/dL (ref 70–99)

## 2018-11-02 LAB — POCT I-STAT 7, (LYTES, BLD GAS, ICA,H+H)
Acid-Base Excess: 12 mmol/L — ABNORMAL HIGH (ref 0.0–2.0)
Bicarbonate: 38.3 mmol/L — ABNORMAL HIGH (ref 20.0–28.0)
Calcium, Ion: 1.25 mmol/L (ref 1.15–1.40)
HCT: 27 % — ABNORMAL LOW (ref 36.0–46.0)
Hemoglobin: 9.2 g/dL — ABNORMAL LOW (ref 12.0–15.0)
O2 Saturation: 90 %
Potassium: 5.1 mmol/L (ref 3.5–5.1)
Sodium: 146 mmol/L — ABNORMAL HIGH (ref 135–145)
TCO2: 40 mmol/L — ABNORMAL HIGH (ref 22–32)
pCO2 arterial: 57.3 mmHg — ABNORMAL HIGH (ref 32.0–48.0)
pH, Arterial: 7.433 (ref 7.350–7.450)
pO2, Arterial: 59 mmHg — ABNORMAL LOW (ref 83.0–108.0)

## 2018-11-02 LAB — CBC
HCT: 28.8 % — ABNORMAL LOW (ref 36.0–46.0)
Hemoglobin: 8 g/dL — ABNORMAL LOW (ref 12.0–15.0)
MCH: 23.1 pg — ABNORMAL LOW (ref 26.0–34.0)
MCHC: 27.8 g/dL — ABNORMAL LOW (ref 30.0–36.0)
MCV: 83.2 fL (ref 80.0–100.0)
Platelets: 215 10*3/uL (ref 150–400)
RBC: 3.46 MIL/uL — ABNORMAL LOW (ref 3.87–5.11)
RDW: 24.6 % — ABNORMAL HIGH (ref 11.5–15.5)
WBC: 14.3 10*3/uL — ABNORMAL HIGH (ref 4.0–10.5)
nRBC: 0.9 % — ABNORMAL HIGH (ref 0.0–0.2)

## 2018-11-02 LAB — BASIC METABOLIC PANEL
Anion gap: 9 (ref 5–15)
BUN: 22 mg/dL — ABNORMAL HIGH (ref 6–20)
CO2: 36 mmol/L — ABNORMAL HIGH (ref 22–32)
Calcium: 8.8 mg/dL — ABNORMAL LOW (ref 8.9–10.3)
Chloride: 98 mmol/L (ref 98–111)
Creatinine, Ser: 0.42 mg/dL — ABNORMAL LOW (ref 0.44–1.00)
GFR calc Af Amer: >60 mL/min (ref >60)
GFR calc non Af Amer: >60 mL/min (ref >60)
Glucose, Bld: 148 mg/dL — ABNORMAL HIGH (ref 70–99)
Potassium: 3.2 mmol/L — ABNORMAL LOW (ref 3.5–5.1)
Sodium: 143 mmol/L (ref 135–145)

## 2018-11-02 LAB — TYPE AND SCREEN
ABO/RH(D): O POS
Antibody Screen: NEGATIVE
Unit division: 0

## 2018-11-02 LAB — BPAM RBC
Blood Product Expiration Date: 202005062359
ISSUE DATE / TIME: 202004301515
Unit Type and Rh: 5100

## 2018-11-02 LAB — MAGNESIUM: Magnesium: 2.1 mg/dL (ref 1.7–2.4)

## 2018-11-02 LAB — PHOSPHORUS: Phosphorus: 3.6 mg/dL (ref 2.5–4.6)

## 2018-11-02 MED ORDER — DEXTROSE 50 % IV SOLN
25.0000 mL | Freq: Once | INTRAVENOUS | Status: AC
  Administered 2018-11-02: 07:00:00 25 mL via INTRAVENOUS

## 2018-11-02 MED ORDER — DEXTROSE 50 % IV SOLN
50.0000 mL | Freq: Once | INTRAVENOUS | Status: AC
  Administered 2018-11-02: 50 mL via INTRAVENOUS

## 2018-11-02 MED ORDER — DEXTROSE 50 % IV SOLN
INTRAVENOUS | Status: AC
  Filled 2018-11-02: qty 50

## 2018-11-02 MED ORDER — POTASSIUM CHLORIDE 10 MEQ/50ML IV SOLN
10.0000 meq | INTRAVENOUS | Status: AC
  Administered 2018-11-02 (×4): 10 meq via INTRAVENOUS
  Filled 2018-11-02 (×4): qty 50

## 2018-11-02 MED ORDER — DEXTROSE 5 % IV SOLN
INTRAVENOUS | Status: DC
  Administered 2018-11-02 – 2018-11-04 (×3): via INTRAVENOUS

## 2018-11-02 MED ORDER — FREE WATER
200.0000 mL | Freq: Three times a day (TID) | Status: DC
  Administered 2018-11-02 – 2018-11-04 (×6): 200 mL

## 2018-11-02 NOTE — Progress Notes (Signed)
NAME:  Kathy Buckley, MRN:  [id number], DOB:  1991/04/29, LOS: 8 ADMISSION DATE:  10/11/2018, CONSULTATION DATE: October 25, 2018 REFERRING MD: Dr. 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  Recently admitted for pneumonia and returning from [facility] for worsening vent needs.  She resides at [facility] with baseline GCS is 3.  She was recently admitted on 4/19 to [facility] with worsening oxygenation.  She was ruled out for COVID-19 at that time with a negative nasopharyngeal swab.  Pneumonia was presumed to be bacterial and she was treated with cefepime, Flagyl, and vancomycin. ( Fever and tachycardia, which were the reasons for her presentation) had resolved and she was transferred back to [facility] the following day on 4/20.  Tracheal aspirate taken while inpatient was positive for multidrug-resistant Achromobacter.  Repeat aspirate during this admit showed stenotrophomonas Course complicated by seizure episode on 4/26?  Myoclonus  Past Medical History   has a past medical history of Acute respiratory failure (HCC), Anoxic brain damage (HCC), Cardiac arrest (HCC), Central corneal ulcer, left eye, Idiopathic hypotension, Tachyarrhythmia, and Viral hepatitis.  Significant Hospital Events   4/19 admit to [facility] for PNA 4/20 Transfer to [facility] 4/23 Admit to [facility] for hypoxia 4/28 fever 101 , PEEP increased to 10  Consults:  N/A  Procedures:  4/25 - tracheostomy change to 8 Portex. 4/25 - bronchoscopy shows small amount white secretions.   Significant Diagnostic Tests:  EEG 4/28 >> neg  Micro Data:  4/19 trach aspirate >> MDR Achromobacter.   4/19 - COVID negative 4/24- covid negative 4/25 tracheal aspirate - >100,000 Stenotrophomonas, 40 k pseudomonas  Antimicrobials:  Cefepime 4/19 > 4/23 Flagyl 4/19 > 4/23 Vancomycin 4/19 > 4/23 Meropenem 4/23 >>>4/26 Bactrim 4/26 >> ceftax 4/29 >>   Interim history/subjective:  5/1 - Afebrile last 24 hours. She remains on PEEP of 10, FiO2 down to 60%. Urine output improved with Lasix, Na normal with free water at 400cc q4h. RN concerned about frequent desaturations but no mucus aspirated   Objective   Blood pressure 98/64, pulse (!) 112, temperature 97.9 F (36.6 C), temperature source Axillary, resp. rate (!) 22, height 5\' 5"  (1.651 m), weight 77.6 kg, SpO2 90 %.    Vent Mode: PCV FiO2 (%):  [60 %] 60 % Set Rate:  [25 bmp] 25 bmp PEEP:  [10 cmH20] 10 cmH20 Plateau Pressure:  [31 cmH20-38 cmH20] 31 cmH20   Intake/Output Summary (Last 24 hours) at 11/02/2018 1025 Last data filed at 11/02/2018 1000 Gross per 24 hour  Intake 2744.51 ml  Output 1295 ml  Net 1449.51 ml   Filed Weights   10/31/18 0415 11/01/18 0500 11/02/18 0500  Weight: 75.7 kg 77.2 kg 77.6 kg    General Appearance:  Looks criticall ill OBESE - + Head:  Normocephalic, without obvious abnormality, atraumatic Eyes:  PERRL - yes, conjunctiva/corneas - muddy     Ears:  Normal external ear canals, both ears Nose:  G tube - YES Throat:  ETT TUBE - No , OG tube - no. TRACH + - chronic Neck:  Supple,  No enlargement/tenderness/nodules Lungs: Clear to auscultation bilaterally, Ventilator   Synchrony - PRVC +, 60% fio2 , peep 10,  Heart:  S1 and S2 normal, no murmur, CVP - x.  Pressors - no Abdomen:  Soft, no masses, no organomegaly Genitalia / Rectal:  Not done Extremities:  Extremities- intact Skin:  ntact in exposed areas .  Sacral area - not examined Neurologic:  Sedation - none -> RASS - -4 . Moves all 4s - no. CAM-ICU - x . Orientation - not. Has intermittent baseline tremors      LABS    PULMONARY Recent Labs  Lab 10/27/18 0020 10/27/18 0425  PHART 7.213* 7.397  PCO2ART 88.9* 55.1*  PO2ART 58.2* 46.4*  HCO3 34.8* 33.4*  O2SAT 82.5 80.6    CBC Recent Labs  Lab 10/31/18 0413 11/01/18 0417 11/01/18 2007 11/02/18 0400  HGB 7.3* 7.0* 8.1* 8.0*   HCT 29.1* 26.8* 28.9* 28.8*  WBC 24.2* 19.0*  --  14.3*  PLT 282 263  --  215    COAGULATION No results for input(s): INR in the last 168 hours.  CARDIAC  No results for input(s): TROPONINI in the last 168 hours. No results for input(s): PROBNP in the last 168 hours.   CHEMISTRY Recent Labs  Lab 10/27/18 0205  10/29/18 0428 10/30/18 0342 10/31/18 0413 11/01/18 0417 11/02/18 0400  NA 148*   < > 155* 152* 151* 150* 143  K 3.2*   < > 4.0 3.7 4.5 3.6 3.2*  CL 107   < > 111 107 106 102 98  CO2 35*   < > 38* 38* 39* 40* 36*  GLUCOSE 126*   < > 137* 130* 122* 111* 148*  BUN 20   < > 25* 26* 30* 27* 22*  CREATININE 0.46   < > 0.59 0.50 0.58 0.57 0.42*  CALCIUM 8.8*   < > 8.8* 8.7* 8.8* 8.7* 8.8*  MG 1.8  --   --   --  2.3  --  2.1  PHOS 5.7*  --   --   --  3.6  --  3.6   < > = values in this interval not displayed.   Estimated Creatinine Clearance: 110.6 mL/min (A) (by C-G formula based on SCr of 0.42 mg/dL (L)).   LIVER No results for input(s): AST, ALT, ALKPHOS, BILITOT, PROT, ALBUMIN, INR in the last 168 hours.   INFECTIOUS No results for input(s): LATICACIDVEN, PROCALCITON in the last 168 hours.   ENDOCRINE CBG (last 3)  Recent Labs    11/02/18 0346 11/02/18 0719 11/02/18 0839  GLUCAP 128* 67* 84         IMAGING x48h  - image(s) personally visualized  -   highlighted in bold Dg Abd 1 View  Result Date: 11/01/2018 CLINICAL DATA:  Ileus. EXAM: ABDOMEN - 1 VIEW COMPARISON:  Radiographs of August 20, 2009. FINDINGS: No abnormal bowel-gas pattern is noted. Interval placement of gastrostomy tube which appears to be in grossly good position. No abnormal calcifications are noted. IMPRESSION: Interval placement of gastrostomy tube in grossly good position. No definite evidence of bowel obstruction or ileus. Electronically Signed   By: Lupita Raider M.D.   On: 11/01/2018 14:21    Resolved Hospital Problem list   Septic shock -   Assessment & Plan:     ASSESSMENT / PLAN:  PULMONARY A:  BAseline  : chronic resp failur with chronic trach ADmit issue - acute resp failure with aRDS due to VAP   11/02/2018 -> 60% fio2, and peep 10 on PRVC with desatrs. CXR with ARDS  P:   Check ABG Ct Pressure control ventilation-  Achieving tidal volumes in the 300 range with pressure control of 30 Keep PEEP to 10 , drop FiO2 as able Chest physiotherapy  Continue bronchodilators. Continue acetylcysteine   NEUROLOGIC A:   Baseline  coma with tremors (Possible seizure activity vs myoclonus. EEG neg, this occurred while on meropenem)   11/02/2018 - unchanged coma P:   Supportive care    VASCULAR A:   Not on pressors  P:  MAP goal > 65  CARDIAC A: At risk MI but none  P: clincally monitor   INFECTIOUS A:   HCAP/VAP present on admission associated multi-drug resistant bacterial pneumonia. Stenotrophomonas.. On 4/28 Rx for pseudomonas given fever  11/02/2018 - afebrile x 24h  P:   ID managing with abx stop date Continue Bactrim x 14 ds total until 5/11 Ct  ceftaz for 7 days until 5/5  RENAL A:  Normal creat P:  monitor  ELECTROLYTES A:  Mild low k and resolved hypernatremia P: Replete K Drop free water dosing   GASTROINTESTINAL A:   TF on hold since 11/01/2018  P:   Resume TF  HEMATOLOGIC A:  Anemia of critical illness   P:  - PRBC for hgb </= 6.9gm%    - exceptions are   -  if ACS susepcted/confirmed then transfuse for hgb </= 8.0gm%,  or    -  active bleeding with hemodynamic instability, then transfuse regardless of hemoglobin value   At at all times try to transfuse 1 unit prbc as possible with exception of active hemorrhage     ENDOCRINE A:   At risk hyperglycemia   P:   ssi  MSK/DERM No bed sores per RN  Plan Preventative care    Goals of care : Is reportedly GCS 3 at baseline. Family has been approached multiple times with palliative discussions and prefers to keep Full Code despite very  poor long term prognosis.  11/02/2018 multiple episodes of desaturation during the night currently on 60% FiO2 10 of PEEP pressure control with 30 and O2 sats only 90% therefore [facility]  Best practice:  Diet: TF via PEG tube. Pain/Anxiety/Delirium protocol (if indicated): PRN VAP protocol (if indicated): bundle ordered. DVT prophylaxis: Heparin SQ GI prophylaxis: Protonix Glucose control: n/a Mobility: BR Code Status: FULL Family Communication: [name] updated 4/29.  Disposition: ICU     ATTESTATION & SIGNATURE   The patient Jolleen C Dames is critically ill with multiple organ systems failure and requires high complexity decision making for assessment and support, frequent evaluation and titration of therapies, application of advanced monitoring technologies and extensive interpretation of multiple databases.   Critical Care Time devoted to patient care services described in this note is  30  Minutes. This time reflects time of care of this signee Dr Kalman ShanMurali Yolandra Habig. This critical care time does not reflect procedure time, or teaching time or supervisory time of PA/NP/Med student/Med Resident etc but could involve care discussion time     Dr. Kalman ShanMurali Muneeb Veras, M.D., [facility] HospitalF.C.C.P Pulmonary and Critical Care Medicine Staff Physician [facility] Pager: [phone], If no answer or between  15:00h - 7:00h: call [phone]  11/02/2018 11:09 AM

## 2018-11-02 NOTE — Progress Notes (Signed)
CSW spoke with patient's RN Sam to obtain an update for patient. RN stated that he is unsure if patient is ready to discharge back to [facility] today or not but will speak with MD during rounds today. RN will update CSW whenever possible.  CSW spoke with Misty Stanley at [facility] and she stated there is bed availability today however other admissions are pending. CSW will call back once more information is available regarding discharge.Edwin Dada, MSW, LCSW-A Clinical Social Worker Emergency Department [facility] [phone]

## 2018-11-02 NOTE — Progress Notes (Signed)
Hypoglycemic Event  CBG: 41 @ 11:45am  Treatment: Adm 50 ml of D50   Symptoms: none identified  Follow-up CBG: Time:12:00 pm  CBG Result:254  Possible Reasons for Event: Pt NPO  Comments/MD notified:CCM notified @3 :40pm CBG was 84, CCM notified and order given to adm dextrose 5% at 40cc, will continue to monitor   Ninfa Meeker  ;

## 2018-11-02 NOTE — Progress Notes (Signed)
Hypoglycemic Event  CBG: 67  Treatment: D50, 25 ml  Symptoms: none identfied  Follow-up CBG: [id number] CBG Result:84 Possible Reasons for Event: Pt tube feed off  Comments/MD notified:CCM notified    Ninfa Meeker

## 2018-11-02 NOTE — Progress Notes (Signed)
eLink Physician-Brief Progress Note Patient Name: Kathy Buckley DOB: 05/15/1991 MRN: [id number]   Date of Service  11/02/2018  HPI/Events of Note  K+ 3.2  eICU Interventions  Elink electrolyte replacement orders for K+ entered        Migdalia Dk 11/02/2018, 6:17 AM

## 2018-11-02 NOTE — Progress Notes (Signed)
Inpatient Diabetes Program Recommendations  AACE/ADA: New Consensus Statement on Inpatient Glycemic Control (2015)  Target Ranges:  Prepandial:   less than 140 mg/dL      Peak postprandial:   less than 180 mg/dL (1-2 hours)      Critically ill patients:  140 - 180 mg/dL   Lab Results  Component Value Date   GLUCAP 84 11/02/2018    Review of Glycemic Control Results for CARALEIGH, EAKLE (MRN [id number]) as of 11/02/2018 15:27  Ref. Range 11/01/2018 23:21 11/02/2018 03:46 11/02/2018 07:19 11/02/2018 08:39 11/02/2018 11:38 11/02/2018 11:39 11/02/2018 12:05 11/02/2018 15:14  Glucose-Capillary Latest Ref Range: 70 - 99 mg/dL 101 (H) 751 (H) 67 (L) 84 58 (L) 41 (LL) 254 (H) 84   Diabetes history: None Outpatient Diabetes medications: None Current orders for Inpatient glycemic control: Novolog moderate q 4 hours  Inpatient Diabetes Program Recommendations:    Consider d/c of Novolog correction q 4 hours.    Thanks  Beryl Meager, RN, BC-ADM Inpatient Diabetes Coordinator Pager [phone] (8a-5p)

## 2018-11-02 DEATH — deceased

## 2018-11-03 LAB — GLUCOSE, CAPILLARY
Glucose-Capillary: 158 mg/dL — ABNORMAL HIGH (ref 70–99)
Glucose-Capillary: 106 mg/dL — ABNORMAL HIGH (ref 70–99)
Glucose-Capillary: 137 mg/dL — ABNORMAL HIGH (ref 70–99)
Glucose-Capillary: 121 mg/dL — ABNORMAL HIGH (ref 70–99)
Glucose-Capillary: 212 mg/dL — ABNORMAL HIGH (ref 70–99)

## 2018-11-03 LAB — CBC WITH DIFFERENTIAL/PLATELET
Abs Immature Granulocytes: 0.19 10*3/uL — ABNORMAL HIGH (ref 0.00–0.07)
Basophils Absolute: 0.1 10*3/uL (ref 0.0–0.1)
Basophils Relative: 0 %
Eosinophils Absolute: 0.2 10*3/uL (ref 0.0–0.5)
Eosinophils Relative: 1 %
HCT: 29.1 % — ABNORMAL LOW (ref 36.0–46.0)
Hemoglobin: 8 g/dL — ABNORMAL LOW (ref 12.0–15.0)
Immature Granulocytes: 1 %
Lymphocytes Relative: 13 %
Lymphs Abs: 3.2 10*3/uL (ref 0.7–4.0)
MCH: 23.6 pg — ABNORMAL LOW (ref 26.0–34.0)
MCHC: 27.5 g/dL — ABNORMAL LOW (ref 30.0–36.0)
MCV: 85.8 fL (ref 80.0–100.0)
Monocytes Absolute: 1.8 10*3/uL — ABNORMAL HIGH (ref 0.1–1.0)
Monocytes Relative: 7 %
Neutro Abs: 19.7 10*3/uL — ABNORMAL HIGH (ref 1.7–7.7)
Neutrophils Relative %: 78 %
Platelets: 218 10*3/uL (ref 150–400)
RBC: 3.39 MIL/uL — ABNORMAL LOW (ref 3.87–5.11)
RDW: 25.4 % — ABNORMAL HIGH (ref 11.5–15.5)
WBC: 25.1 10*3/uL — ABNORMAL HIGH (ref 4.0–10.5)
nRBC: 1.7 % — ABNORMAL HIGH (ref 0.0–0.2)

## 2018-11-03 LAB — HEPATIC FUNCTION PANEL
ALT: 20 U/L (ref 0–44)
AST: 31 U/L (ref 15–41)
Albumin: 1.4 g/dL — ABNORMAL LOW (ref 3.5–5.0)
Alkaline Phosphatase: 133 U/L — ABNORMAL HIGH (ref 38–126)
Bilirubin, Direct: <0.1 mg/dL (ref 0.0–0.2)
Total Bilirubin: 0.2 mg/dL — ABNORMAL LOW (ref 0.3–1.2)
Total Protein: 9.2 g/dL — ABNORMAL HIGH (ref 6.5–8.1)

## 2018-11-03 LAB — BASIC METABOLIC PANEL
Anion gap: 6 (ref 5–15)
BUN: 16 mg/dL (ref 6–20)
CO2: 35 mmol/L — ABNORMAL HIGH (ref 22–32)
Calcium: 8.6 mg/dL — ABNORMAL LOW (ref 8.9–10.3)
Chloride: 97 mmol/L — ABNORMAL LOW (ref 98–111)
Creatinine, Ser: 0.62 mg/dL (ref 0.44–1.00)
GFR calc Af Amer: >60 mL/min (ref >60)
GFR calc non Af Amer: >60 mL/min (ref >60)
Glucose, Bld: 126 mg/dL — ABNORMAL HIGH (ref 70–99)
Potassium: 5.3 mmol/L — ABNORMAL HIGH (ref 3.5–5.1)
Sodium: 138 mmol/L (ref 135–145)

## 2018-11-03 LAB — MAGNESIUM: Magnesium: 1.8 mg/dL (ref 1.7–2.4)

## 2018-11-03 LAB — PROTIME-INR
INR: 1.4 — ABNORMAL HIGH (ref 0.8–1.2)
Prothrombin Time: 17.3 seconds — ABNORMAL HIGH (ref 11.4–15.2)

## 2018-11-03 LAB — LACTIC ACID, PLASMA: Lactic Acid, Venous: 1.4 mmol/L (ref 0.5–1.9)

## 2018-11-03 LAB — PHOSPHORUS: Phosphorus: 3.6 mg/dL (ref 2.5–4.6)

## 2018-11-03 MED ORDER — MAGNESIUM SULFATE 2 GM/50ML IV SOLN
2.0000 g | Freq: Once | INTRAVENOUS | Status: AC
  Administered 2018-11-03: 13:00:00 2 g via INTRAVENOUS
  Filled 2018-11-03: qty 50

## 2018-11-03 NOTE — Progress Notes (Signed)
NAME:  Kathy Buckley, MRN:  [id number], DOB:  [birth year], LOS: 9 ADMISSION DATE:  11/05/18, CONSULTATION DATE: 2018/11/05 REFERRING MD: Dr. 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  Recently admitted for pneumonia and returning from [facility] for worsening vent needs.  She resides at [facility] with baseline GCS is 3.  She was recently admitted on 4/19 to [facility] with worsening oxygenation.  She was ruled out for COVID-19 at that time with a negative nasopharyngeal swab.  Pneumonia was presumed to be bacterial and she was treated with cefepime, Flagyl, and vancomycin. ( Fever and tachycardia, which were the reasons for her presentation) had resolved and she was transferred back to [facility] the following day on 4/20.  Tracheal aspirate taken while inpatient was positive for multidrug-resistant Achromobacter.  Repeat aspirate during this admit showed stenotrophomonas Course complicated by seizure episode on 4/26?  Myoclonus  Past Medical History   has a past medical history of Acute respiratory failure (HCC), Anoxic brain damage (HCC), Cardiac arrest (HCC), Central corneal ulcer, left eye, Idiopathic hypotension, Tachyarrhythmia, and Viral hepatitis.  Significant Hospital Events   4/19 admit to [facility] for PNA 4/20 Transfer to [facility] 4/23 Admit to [facility] for hypoxia 4/28 fever 101 , PEEP increased to 10  Consults:  N/A  Procedures:  4/25 - tracheostomy change to 8 Portex. 4/25 - bronchoscopy shows small amount white secretions.  5/1 - Afebrile last 24 hours. She remains on PEEP of 10, FiO2 down to 60%. Urine output improved with Lasix, Na normal with free water at 400cc q4h. RN concerned about frequent desaturations but no mucus aspirated  Significant Diagnostic Tests:  EEG 4/28 >> neg  Micro Data:  4/19 trach aspirate >> MDR Achromobacter.   4/19 - COVID negative 4/24- covid negative  4/25 tracheal aspirate - >100,000 Stenotrophomonas, 40 k pseudomonas  Antimicrobials:  Cefepime 4/19 > 4/23 Flagyl 4/19 > 4/23 Vancomycin 4/19 > 4/23 Meropenem 4/23 >>>4/26 Bactrim 4/26 >> ceftax 4/29 >>  Interim history/subjective:   5/2 - worsening hypoxemia. Now 80% fio2, peep 10. Not on pressors . Febrile and high wbc again +  Objective   Blood pressure 106/71, pulse (!) 102, temperature 98.6 F (37 C), temperature source Oral, resp. rate (!) 25, height 5\' 5"  (1.651 m), weight 81.2 kg, SpO2 90 %.    Vent Mode: PCV FiO2 (%):  [80 %-100 %] 80 % Set Rate:  [25 bmp] 25 bmp PEEP:  [10 cmH20] 10 cmH20 Plateau Pressure:  [32 cmH20-38 cmH20] 37 cmH20   Intake/Output Summary (Last 24 hours) at 11/03/2018 1113 Last data filed at 11/03/2018 1100 Gross per 24 hour  Intake 4614.62 ml  Output 1185 ml  Net 3429.62 ml   Filed Weights   11/01/18 0500 11/02/18 0500 11/03/18 0500  Weight: 77.2 kg 77.6 kg 81.2 kg   General Appearance:  Looks criticall ill OBESE - + Head:  Normocephalic, without obvious abnormality, atraumatic Eyes:  PERRL - yes, conjunctiva/corneas - muddy     Ears:  Normal external ear canals, both ears Nose:  G tube - no Throat:  ETT TUBE - no , OG tube - no. TRACH + Neck:  Supple,  No enlargement/tenderness/nodules Lungs: Clear to auscultation bilaterally, Ventilator   Synchrony - yes on 80% fio2 Heart:  S1 and S2 normal, no murmur, CVP - no.  Pressors - no Abdomen:  Soft, no masses, no organomegaly Genitalia / Rectal:  Not done Extremities:  Extremities- intact Skin:  ntact in exposed areas . Sacral area - not examined Neurologic:  Sedation - none -> RASS - -4        LABS    PULMONARY Recent Labs  Lab 11/02/18 1126  PHART 7.433  PCO2ART 57.3*  PO2ART 59.0*  HCO3 38.3*  TCO2 40*  O2SAT 90.0    CBC Recent Labs  Lab 11/01/18 0417  11/02/18 0400 11/02/18 1126 11/03/18 0447  HGB 7.0*   < > 8.0* 9.2* 8.0*  HCT 26.8*   < > 28.8* 27.0* 29.1*   WBC 19.0*  --  14.3*  --  25.1*  PLT 263  --  215  --  218   < > = values in this interval not displayed.    COAGULATION Recent Labs  Lab 11/03/18 0447  INR 1.4*    CARDIAC  No results for input(s): TROPONINI in the last 168 hours. No results for input(s): PROBNP in the last 168 hours.   CHEMISTRY Recent Labs  Lab 10/30/18 0342 10/31/18 0413 11/01/18 0417 11/02/18 0400 11/02/18 1126 11/03/18 0039 11/03/18 0447  NA 152* 151* 150* 143 146* 138  --   K 3.7 4.5 3.6 3.2* 5.1 5.3*  --   CL 107 106 102 98  --  97*  --   CO2 38* 39* 40* 36*  --  35*  --   GLUCOSE 130* 122* 111* 148*  --  126*  --   BUN 26* 30* 27* 22*  --  16  --   CREATININE 0.50 0.58 0.57 0.42*  --  0.62  --   CALCIUM 8.7* 8.8* 8.7* 8.8*  --  8.6*  --   MG  --  2.3  --  2.1  --   --  1.8  PHOS  --  3.6  --  3.6  --   --  3.6   Estimated Creatinine Clearance: 113.2 mL/min (by C-G formula based on SCr of 0.62 mg/dL).   LIVER Recent Labs  Lab 11/03/18 0447  AST 31  ALT 20  ALKPHOS 133*  BILITOT 0.2*  PROT 9.2*  ALBUMIN 1.4*  INR 1.4*     INFECTIOUS Recent Labs  Lab 11/03/18 0039  LATICACIDVEN 1.4     ENDOCRINE CBG (last 3)  Recent Labs    11/02/18 2346 11/03/18 0344 11/03/18 0755  GLUCAP 123* 158* 106*         IMAGING x48h  - image(s) personally visualized  -   highlighted in bold Dg Abd 1 View  Result Date: 11/01/2018 CLINICAL DATA:  Ileus. EXAM: ABDOMEN - 1 VIEW COMPARISON:  Radiographs of August 20, 2009. FINDINGS: No abnormal bowel-gas pattern is noted. Interval placement of gastrostomy tube which appears to be in grossly good position. No abnormal calcifications are noted. IMPRESSION: Interval placement of gastrostomy tube in grossly good position. No definite evidence of bowel obstruction or ileus. Electronically Signed   By: Lupita Raider M.D.   On: 11/01/2018 14:21   Dg Chest Port 1 View  Result Date: 11/02/2018 CLINICAL DATA:  Respiratory failure. EXAM: PORTABLE  CHEST 1 VIEW COMPARISON:  10/31/2018 FINDINGS: Stable appearance of tracheostomy. Stable positioning of PICC line. Stable shift of the heart and mediastinum towards the right potentially due to some persistent volume loss in the right lower lung. Degree airspace disease in both lungs is relatively stable with some slight improvement in the left upper lobe and potentially slight worsening in the left lower lung.  No pneumothorax or significant pleural effusions. IMPRESSION: Relatively stable bilateral pulmonary airspace disease with slight improvement in aeration of the left upper lobe and potentially slight increase in airspace disease in the left lower lung. Electronically Signed   By: Irish LackGlenn  Yamagata M.D.   On: 11/02/2018 10:46    Resolved Hospital Problem list   Septic shock -   Assessment & Plan:    ASSESSMENT / PLAN:  PULMONARY A:  BAseline  : chronic resp failur with chronic trach ADmit issue - acute resp failure with aRDS due to VAP   11/03/2018 -> 80% fio2, peep 10  P:   Ct Pressure control ventilation-  Achieving tidal volumes in the 300 range with pressure control of 30 Keep PEEP to 10 , drop FiO2 as able Chest physiotherapy  Continue bronchodilators. Continue acetylcysteine   NEUROLOGIC A:   Baseline coma with tremors (Possible seizure activity vs myoclonus. EEG neg, this occurred while on meropenem)   11/03/2018 - unchanged coma P:   Supportive care    VASCULAR A:   Not on pressors  P:  MAP goal > 65  CARDIAC A: At risk MI but none  P: clincally monitor   INFECTIOUS A:   HCAP/VAP present on admission associated multi-drug resistant bacterial pneumonia. Stenotrophomonas.. On 4/28 Rx for pseudomonas given fever  11/03/2018 - febrile again  P:   ID managing with abx stop date Continue Bactrim x 14 ds total until 5/11 Ct  ceftaz for 7 days until 5/5 Repeat trach aspirate culture 11/03/2018  RENAL A:  Normal creat P:  monitor  ELECTROLYTES A:   Mild low mag  P: Replete mag Drop free water dosing Monitor   GASTROINTESTINAL A:   TF on hold since 11/01/2018. BAck on as of 11/02/2018  P:   TF via PEG tube  HEMATOLOGIC A:  Anemia of critical illness   P:  - PRBC for hgb </= 6.9gm%    - exceptions are   -  if ACS susepcted/confirmed then transfuse for hgb </= 8.0gm%,  or    -  active bleeding with hemodynamic instability, then transfuse regardless of hemoglobin value   At at all times try to transfuse 1 unit prbc as possible with exception of active hemorrhage     ENDOCRINE A:   At risk hyperglycemia   P:   ssi  MSK/DERM No bed sores per RN  Plan Preventative care    Goals of care : Is reportedly GCS 3 at baseline. Family has been approached multiple times with palliative discussions and prefers to keep Full Code despite very poor long term prognosis.  11/02/2018 multiple episodes of desaturation during the night currently on 60% FiO2 10 of PEEP pressure control with 30 and O2 sats only 90% therefore [facility]. Mom updated 11/03/2018 over phone - explained she has worsening ARDS. Mom had no other questions  Best practice:  Diet: TF via PEG tube. Pain/Anxiety/Delirium protocol (if indicated): PRN VAP protocol (if indicated): bundle ordered. DVT prophylaxis: Heparin SQ GI prophylaxis: Protonix Glucose control: n/a Mobility: BR Code Status: FULL  Family Communication: [name] updated 4/29. And 11/03/2018 - explained worsening ARDS. MOm absorbing information. Long 15-30 seconds silence from mom before she said "okay...hmm". Probed mom - " she said Humaira C Gartley has been on a long journey and is struggling to see daughter suffer". Explained MDR pneumonia and ARDS and real bad prognosis. Empathized on mom's suffering. Mom was in  tears. Recommended No CPR. Asked mom to pray about no CPR. For now full code                                                                                             Disposition: ICU     ATTESTATION & SIGNATURE   The patient Kathy Buckley is critically ill with multiple organ systems failure and requires high complexity decision making for assessment and support, frequent evaluation and titration of therapies, application of advanced monitoring technologies and extensive interpretation of multiple databases.   Critical Care Time devoted to patient care services described in this note is  30  Minutes. This time reflects time of care of this signee Dr Kalman Shan. This critical care time does not reflect procedure time, or teaching time or supervisory time of PA/NP/Med student/Med Resident etc but could involve care discussion time     Dr. Kalman Shan, M.D., [facility].C.P Pulmonary and Critical Care Medicine Staff Physician [facility] [place] Pulmonary and Critical Care Pager: [phone], If no answer or between  15:00h - 7:00h: call [phone]  11/03/2018 11:39 AM

## 2018-11-03 NOTE — Progress Notes (Signed)
RT note:  Patient does not meet daily SBT requirements per protocol for FIO2 and PEEP and chronic ventilator.  Tolerating current ventilator settings well.  Will continue to monitor.

## 2018-11-03 NOTE — Progress Notes (Signed)
RT note: sputum sample obtained and sent down to main lab without complications. 

## 2018-11-03 NOTE — Progress Notes (Signed)
Assisted tele visit to patient with mother, father and family member.  Redmond Pulling, RN

## 2018-11-04 ENCOUNTER — Inpatient Hospital Stay (HOSPITAL_COMMUNITY): Payer: Medicaid Other

## 2018-11-04 DIAGNOSIS — G931 Anoxic brain damage, not elsewhere classified: Secondary | ICD-10-CM

## 2018-11-04 LAB — GLUCOSE, CAPILLARY
Glucose-Capillary: 127 mg/dL — ABNORMAL HIGH (ref 70–99)
Glucose-Capillary: 138 mg/dL — ABNORMAL HIGH (ref 70–99)
Glucose-Capillary: 155 mg/dL — ABNORMAL HIGH (ref 70–99)
Glucose-Capillary: 158 mg/dL — ABNORMAL HIGH (ref 70–99)
Glucose-Capillary: 166 mg/dL — ABNORMAL HIGH (ref 70–99)
Glucose-Capillary: 175 mg/dL — ABNORMAL HIGH (ref 70–99)
Glucose-Capillary: 180 mg/dL — ABNORMAL HIGH (ref 70–99)

## 2018-11-04 LAB — CBC WITH DIFFERENTIAL/PLATELET
Abs Immature Granulocytes: 0.14 10*3/uL — ABNORMAL HIGH (ref 0.00–0.07)
Basophils Absolute: 0.1 10*3/uL (ref 0.0–0.1)
Basophils Relative: 0 %
Eosinophils Absolute: 0.2 10*3/uL (ref 0.0–0.5)
Eosinophils Relative: 1 %
HCT: 27.9 % — ABNORMAL LOW (ref 36.0–46.0)
Hemoglobin: 7.6 g/dL — ABNORMAL LOW (ref 12.0–15.0)
Immature Granulocytes: 1 %
Lymphocytes Relative: 13 %
Lymphs Abs: 2.7 10*3/uL (ref 0.7–4.0)
MCH: 23.6 pg — ABNORMAL LOW (ref 26.0–34.0)
MCHC: 27.2 g/dL — ABNORMAL LOW (ref 30.0–36.0)
MCV: 86.6 fL (ref 80.0–100.0)
Monocytes Absolute: 1.5 10*3/uL — ABNORMAL HIGH (ref 0.1–1.0)
Monocytes Relative: 7 %
Neutro Abs: 15.5 10*3/uL — ABNORMAL HIGH (ref 1.7–7.7)
Neutrophils Relative %: 78 %
Platelets: 194 10*3/uL (ref 150–400)
RBC: 3.22 MIL/uL — ABNORMAL LOW (ref 3.87–5.11)
RDW: 25.5 % — ABNORMAL HIGH (ref 11.5–15.5)
WBC: 20 10*3/uL — ABNORMAL HIGH (ref 4.0–10.5)
nRBC: 1.9 % — ABNORMAL HIGH (ref 0.0–0.2)

## 2018-11-04 LAB — BASIC METABOLIC PANEL
Anion gap: 10 (ref 5–15)
BUN: 16 mg/dL (ref 6–20)
CO2: 32 mmol/L (ref 22–32)
Calcium: 8.2 mg/dL — ABNORMAL LOW (ref 8.9–10.3)
Chloride: 87 mmol/L — ABNORMAL LOW (ref 98–111)
Creatinine, Ser: 0.52 mg/dL (ref 0.44–1.00)
GFR calc Af Amer: >60 mL/min (ref >60)
GFR calc non Af Amer: >60 mL/min (ref >60)
Glucose, Bld: 259 mg/dL — ABNORMAL HIGH (ref 70–99)
Potassium: 4.9 mmol/L (ref 3.5–5.1)
Sodium: 129 mmol/L — ABNORMAL LOW (ref 135–145)

## 2018-11-04 LAB — PHOSPHORUS: Phosphorus: 3.1 mg/dL (ref 2.5–4.6)

## 2018-11-04 LAB — MAGNESIUM: Magnesium: 2 mg/dL (ref 1.7–2.4)

## 2018-11-04 MED ORDER — SENNOSIDES-DOCUSATE SODIUM 8.6-50 MG PO TABS
2.0000 | ORAL_TABLET | Freq: Two times a day (BID) | ORAL | Status: DC
  Administered 2018-11-04 – 2018-11-19 (×23): 2 via ORAL
  Filled 2018-11-04 (×24): qty 2

## 2018-11-04 MED ORDER — IPRATROPIUM-ALBUTEROL 0.5-2.5 (3) MG/3ML IN SOLN
3.0000 mL | Freq: Three times a day (TID) | RESPIRATORY_TRACT | Status: DC
  Administered 2018-11-04 – 2018-11-09 (×15): 3 mL via RESPIRATORY_TRACT
  Filled 2018-11-04 (×15): qty 3

## 2018-11-04 NOTE — Progress Notes (Signed)
RT note: patient does not meet SBT criteria at this time.  Will continue to monitor.

## 2018-11-04 NOTE — Progress Notes (Signed)
NAME:  Kathy Buckley, MRN:  [id number], DOB:  [birth year], LOS: 10 ADMISSION DATE:  19-Nov-2018, CONSULTATION DATE: 11/29/2018 REFERRING MD: Dr. 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  Recently admitted for pneumonia and returning from [facility] for worsening vent needs.  She resides at [facility] with baseline GCS is 3.  She was recently admitted on 4/19 to [facility] with worsening oxygenation.  She was ruled out for COVID-19 at that time with a negative nasopharyngeal swab.  Pneumonia was presumed to be bacterial and she was treated with cefepime, Flagyl, and vancomycin. ( Fever and tachycardia, which were the reasons for her presentation) had resolved and she was transferred back to [facility] the following day on 4/20.  Tracheal aspirate taken while inpatient was positive for multidrug-resistant Achromobacter.  Repeat aspirate during this admit showed stenotrophomonas Course complicated by seizure episode on 4/26?  Myoclonus  Past Medical History   has a past medical history of Acute respiratory failure (HCC), Anoxic brain damage (HCC), Cardiac arrest (HCC), Central corneal ulcer, left eye, Idiopathic hypotension, Tachyarrhythmia, and Viral hepatitis.  Significant Hospital Events   4/19 admit to [facility] for PNA 4/20 Transfer to [facility] 4/23 Admit to [facility] for hypoxia 4/28 fever 101 , PEEP increased to 10  Consults:  N/A  Procedures:  4/25 - tracheostomy change to 8 Portex. 4/25 - bronchoscopy shows small amount white secretions.  5/1 - Afebrile last 24 hours. She remains on PEEP of 10, FiO2 down to 60%. Urine output improved with Lasix, Na normal with free water at 400cc q4h. RN concerned about frequent desaturations but no mucus aspirated   5/2 - worsening hypoxemia. Now 80% fio2, peep 10. Not on pressors . Febrile and high wbc again +  Significant Diagnostic Tests:  EEG 4/28 >> neg   Micro Data:  4/19 trach aspirate >> MDR Achromobacter.   4/19 - COVID negative 4/24- covid negative 4/25 tracheal aspirate - >100,000 Stenotrophomonas, 40 k pseudomonas  Antimicrobials:  Cefepime 4/19 > 4/23 Flagyl 4/19 > 4/23 Vancomycin 4/19 > 4/23 Meropenem 4/23 >>>4/26 Bactrim 4/26 >> ceftax 4/29 >>  Interim history/subjective:    5/3 - 60% fio2, not on pressors. Saliva in mouth. Constipated  Objective   Blood pressure 118/84, pulse (!) 112, temperature (!) 97.1 F (36.2 C), temperature source Axillary, resp. rate (!) 25, height  (1.651 m), weight 80.6 kg, SpO2 91 %.    Vent Mode: PCV FiO2 (%):  [60 %-80 %] 60 % Set Rate:  [25 bmp] 25 bmp PEEP:  [10 cmH20] 10 cmH20 Plateau Pressure:  [36 cmH20-38 cmH20] 37 cmH20   Intake/Output Summary (Last 24 hours) at 11/04/2018 1024 Last data filed at 11/04/2018 0700 Gross per 24 hour  Intake 3826.64 ml  Output 1870 ml  Net 1956.64 ml   Filed Weights   11/02/18 0500 11/03/18 0500 11/04/18 0201  Weight: 77.6 kg 81.2 kg 80.6 kg    General Appearance:  Looks criticall ill OBESE - + Head:  Normocephalic, without obvious abnormality, atraumatic Eyes:  PERRL - yes, conjunctiva/corneas - muddy     Ears:  Normal external ear canals, both ears Nose:  G tube - no Throat:  ETT TUBE - no , OG tube - no. TRACH + Neck:  Supple,  No enlargement/tenderness/nodules Lungs: Clear to auscultation bilaterally, Ventilator   Synchrony - yes Heart:  S1 and S2 normal, no murmur, CVP - no.  Pressors - no Abdomen:  Soft, no masses, no organomegaly.. Distended like before. Soft. Genitalia / Rectal:  Not done Extremities:  Extremities- intact Skin:  ntact in exposed areas . Sacral area - not examined Neurologic:  Sedation - none -> RASS - -4           LABS    PULMONARY Recent Labs  Lab 11/02/18 1126  PHART 7.433  PCO2ART 57.3*  PO2ART 59.0*  HCO3 38.3*  TCO2 40*  O2SAT 90.0    CBC Recent Labs  Lab 11/02/18 0400 11/02/18  1126 11/03/18 0447 11/04/18 0445  HGB 8.0* 9.2* 8.0* 7.6*  HCT 28.8* 27.0* 29.1* 27.9*  WBC 14.3*  --  25.1* 20.0*  PLT 215  --  218 194    COAGULATION Recent Labs  Lab 11/03/18 0447  INR 1.4*    CARDIAC  No results for input(s): TROPONINI in the last 168 hours. No results for input(s): PROBNP in the last 168 hours.   CHEMISTRY Recent Labs  Lab 10/31/18 0413 11/01/18 0417 11/02/18 0400 11/02/18 1126 11/03/18 0039 11/03/18 0447 11/04/18 0445  NA 151* 150* 143 146* 138  --  129*  K 4.5 3.6 3.2* 5.1 5.3*  --  4.9  CL 106 102 98  --  97*  --  87*  CO2 39* 40* 36*  --  35*  --  32  GLUCOSE 122* 111* 148*  --  126*  --  259*  BUN 30* 27* 22*  --  16  --  16  CREATININE 0.58 0.57 0.42*  --  0.62  --  0.52  CALCIUM 8.8* 8.7* 8.8*  --  8.6*  --  8.2*  MG 2.3  --  2.1  --   --  1.8 2.0  PHOS 3.6  --  3.6  --   --  3.6 3.1   Estimated Creatinine Clearance: 112.7 mL/min (by C-G formula based on SCr of 0.52 mg/dL).   LIVER Recent Labs  Lab 11/03/18 0447  AST 31  ALT 20  ALKPHOS 133*  BILITOT 0.2*  PROT 9.2*  ALBUMIN 1.4*  INR 1.4*     INFECTIOUS Recent Labs  Lab 11/03/18 0039  LATICACIDVEN 1.4     ENDOCRINE CBG (last 3)  Recent Labs    11/03/18 2359 11/04/18 0424 11/04/18 0749  GLUCAP 127* 158* 138*         IMAGING x48h  - image(s) personally visualized  -   highlighted in bold Dg Chest Port 1 View  Result Date: 11/04/2018 CLINICAL DATA:  ARDS. EXAM: PORTABLE CHEST 1 VIEW COMPARISON:  Chest x-ray dated Nov 02, 2018. FINDINGS: Unchanged tracheostomy tube and left upper extremity PICC line. Stable cardiomediastinal silhouette with slight rightward shift. Bilateral airspace disease has mildly improved at the left lung base, but coarsened at the right lung base where there is more dense consolidation. Possible new small right pleural effusion. No pneumothorax. No acute osseous abnormality. IMPRESSION: 1. Bilateral airspace disease has slightly  improved at the left lung base, but worsened at the right lung base, and remains consistent with reported clinical history of ARDS. Electronically Signed   By: Obie DredgeWilliam T Derry M.D.   On: 11/04/2018 05:50   Dg Chest Port 1 View  Result Date: 11/02/2018 CLINICAL DATA:  Respiratory failure. EXAM: PORTABLE CHEST 1 VIEW COMPARISON:  10/31/2018 FINDINGS: Stable appearance of tracheostomy. Stable positioning of PICC line. Stable shift of the heart and mediastinum towards the right potentially due to some persistent volume loss in  the right lower lung. Degree airspace disease in both lungs is relatively stable with some slight improvement in the left upper lobe and potentially slight worsening in the left lower lung. No pneumothorax or significant pleural effusions. IMPRESSION: Relatively stable bilateral pulmonary airspace disease with slight improvement in aeration of the left upper lobe and potentially slight increase in airspace disease in the left lower lung. Electronically Signed   By: Irish Lack M.D.   On: 11/02/2018 10:46    Resolved Hospital Problem list   Septic shock -   Assessment & Plan:    ASSESSMENT / PLAN:  PULMONARY A:  BAseline  : chronic resp failur with chronic trach ADmit issue - acute resp failure with aRDS due to VAP   11/04/2018 -> acute resp failure 60% fio2   P:   Ct Pressure control ventilation-  Achieving tidal volumes in the 300 range with pressure control of 30 Keep PEEP to 10 , drop FiO2 as able Chest physiotherapy  Continue bronchodilators. Continue acetylcysteine   NEUROLOGIC A:   Baseline coma with tremors (Possible seizure activity vs myoclonus. EEG neg, this occurred while on meropenem)   11/04/2018 - unchanged coma P:   Supportive care    VASCULAR A:   Not on pressors  P:  MAP goal > 65  CARDIAC A: At risk MI but none  P: clincally monitor   INFECTIOUS A:   HCAP/VAP present on admission associated multi-drug resistant bacterial  pneumonia. Stenotrophomonas.. On 4/28 Rx for pseudomonas given fever  11/04/2018 - febrile again on 11/03/2018 but improved again  P:   Continue Bactrim x 14 ds total until 5/11 Ct  ceftaz for 7 days until 5/5 Await  trach aspirate culture 11/03/2018  RENAL A:  Normal creat P:  monitor  ELECTROLYTES A:  Hyponatremia + - new  P: Replete mag Dc free water Monitor   GASTROINTESTINAL A:   TF on hold since 11/01/2018. BAck on as of 11/02/2018  5/3 - constipated  P:   TF via PEG tube  HEMATOLOGIC A:  Anemia of critical illness   P:  - PRBC for hgb </= 6.9gm%    - exceptions are   -  if ACS susepcted/confirmed then transfuse for hgb </= 8.0gm%,  or    -  active bleeding with hemodynamic instability, then transfuse regardless of hemoglobin value   At at all times try to transfuse 1 unit prbc as possible with exception of active hemorrhage     ENDOCRINE A:   At risk hyperglycemia   P:   ssi  MSK/DERM No bed sores per RN  Plan Preventative care     Best practice:  Diet: TF via PEG tube. Pain/Anxiety/Delirium protocol (if indicated): PRN VAP protocol (if indicated): bundle ordered. DVT prophylaxis: Heparin SQ GI prophylaxis: Protonix Glucose control: n/a Mobility: BR Code Status: FULL  Family Communication:   Mom Cala Bradford updated 4/29.   And 11/03/2018 - explained worsening ARDS. MOm absorbing information. Long 15-30 seconds silence from mom before she said "okay...hmm". Probed mom - " she said Jency C Jaggi has been on a long journey and is struggling to see daughter suffer". Explained MDR pneumonia and ARDS and real bad prognosis. Empathized on mom's suffering. Mom was in tears. Recommended No CPR. Asked mom to pray about no CPR. For now full code       11/04/18 - > updated.  Disposition: ICU     ATTESTATION & SIGNATURE   The patient Kathy Buckley is  critically ill with multiple organ systems failure and requires high complexity decision making for assessment and support, frequent evaluation and titration of therapies, application of advanced monitoring technologies and extensive interpretation of multiple databases.   Critical Care Time devoted to patient care services described in this note is  30  Minutes. This time reflects time of care of this signee Dr Kalman Shan. This critical care time does not reflect procedure time, or teaching time or supervisory time of PA/NP/Med student/Med Resident etc but could involve care discussion time     Dr. Kalman Shan, M.D., [facility].C.P Pulmonary and Critical Care Medicine Staff Physician [place] [place] Pulmonary and Critical Care Pager: [phone], If no answer or between  15:00h - 7:00h: call [phone]  11/04/2018 10:24 AM

## 2018-11-05 DIAGNOSIS — J80 Acute respiratory distress syndrome: Secondary | ICD-10-CM | POA: Diagnosis present

## 2018-11-05 DIAGNOSIS — J9601 Acute respiratory failure with hypoxia: Secondary | ICD-10-CM

## 2018-11-05 LAB — CBC WITH DIFFERENTIAL/PLATELET
Band Neutrophils: 0 %
Basophils Absolute: 0.2 10*3/uL — ABNORMAL HIGH (ref 0.0–0.1)
Basophils Relative: 1 %
Blasts: 0 %
Eosinophils Absolute: 0.2 10*3/uL (ref 0.0–0.5)
Eosinophils Relative: 1 %
HCT: 28.2 % — ABNORMAL LOW (ref 36.0–46.0)
Hemoglobin: 7.8 g/dL — ABNORMAL LOW (ref 12.0–15.0)
Lymphocytes Relative: 7 %
Lymphs Abs: 1.5 10*3/uL (ref 0.7–4.0)
MCH: 23.8 pg — ABNORMAL LOW (ref 26.0–34.0)
MCHC: 27.7 g/dL — ABNORMAL LOW (ref 30.0–36.0)
MCV: 86 fL (ref 80.0–100.0)
Metamyelocytes Relative: 0 %
Monocytes Absolute: 0.4 10*3/uL (ref 0.1–1.0)
Monocytes Relative: 2 %
Myelocytes: 0 %
Neutro Abs: 18.5 10*3/uL — ABNORMAL HIGH (ref 1.7–7.7)
Neutrophils Relative %: 89 %
Other: 0 %
Platelets: 193 10*3/uL (ref 150–400)
Promyelocytes Relative: 0 %
RBC: 3.28 MIL/uL — ABNORMAL LOW (ref 3.87–5.11)
RDW: 25.4 % — ABNORMAL HIGH (ref 11.5–15.5)
WBC: 20.8 10*3/uL — ABNORMAL HIGH (ref 4.0–10.5)
nRBC: 0 /100 WBC
nRBC: 1.6 % — ABNORMAL HIGH (ref 0.0–0.2)

## 2018-11-05 LAB — GLUCOSE, CAPILLARY
Glucose-Capillary: 196 mg/dL — ABNORMAL HIGH (ref 70–99)
Glucose-Capillary: 190 mg/dL — ABNORMAL HIGH (ref 70–99)
Glucose-Capillary: 180 mg/dL — ABNORMAL HIGH (ref 70–99)
Glucose-Capillary: 189 mg/dL — ABNORMAL HIGH (ref 70–99)
Glucose-Capillary: 139 mg/dL — ABNORMAL HIGH (ref 70–99)
Glucose-Capillary: 219 mg/dL — ABNORMAL HIGH (ref 70–99)

## 2018-11-05 LAB — BASIC METABOLIC PANEL
Anion gap: 4 — ABNORMAL LOW (ref 5–15)
BUN: 15 mg/dL (ref 6–20)
CO2: 39 mmol/L — ABNORMAL HIGH (ref 22–32)
Calcium: 8.2 mg/dL — ABNORMAL LOW (ref 8.9–10.3)
Chloride: 88 mmol/L — ABNORMAL LOW (ref 98–111)
Creatinine, Ser: 0.52 mg/dL (ref 0.44–1.00)
GFR calc Af Amer: >60 mL/min (ref >60)
GFR calc non Af Amer: >60 mL/min (ref >60)
Glucose, Bld: 167 mg/dL — ABNORMAL HIGH (ref 70–99)
Potassium: 4.4 mmol/L (ref 3.5–5.1)
Sodium: 131 mmol/L — ABNORMAL LOW (ref 135–145)

## 2018-11-05 LAB — PHOSPHORUS: Phosphorus: 1.8 mg/dL — ABNORMAL LOW (ref 2.5–4.6)

## 2018-11-05 LAB — MAGNESIUM: Magnesium: 1.6 mg/dL — ABNORMAL LOW (ref 1.7–2.4)

## 2018-11-05 MED ORDER — SODIUM PHOSPHATES 45 MMOLE/15ML IV SOLN
20.0000 mmol | Freq: Once | INTRAVENOUS | Status: AC
  Administered 2018-11-05: 11:00:00 20 mmol via INTRAVENOUS
  Filled 2018-11-05 (×2): qty 6.67

## 2018-11-05 MED ORDER — WHITE PETROLATUM EX OINT
TOPICAL_OINTMENT | CUTANEOUS | Status: AC
  Administered 2018-11-05: 1
  Filled 2018-11-05: qty 28.35

## 2018-11-05 MED ORDER — POTASSIUM CHLORIDE 20 MEQ/15ML (10%) PO SOLN
40.0000 meq | Freq: Once | ORAL | Status: AC
  Administered 2018-11-05: 10:00:00 40 meq via ORAL
  Filled 2018-11-05: qty 30

## 2018-11-05 MED ORDER — MAGNESIUM SULFATE 2 GM/50ML IV SOLN
2.0000 g | Freq: Once | INTRAVENOUS | Status: AC
  Administered 2018-11-05: 10:00:00 2 g via INTRAVENOUS
  Filled 2018-11-05: qty 50

## 2018-11-05 MED ORDER — FUROSEMIDE 10 MG/ML IJ SOLN
40.0000 mg | Freq: Once | INTRAMUSCULAR | Status: AC
  Administered 2018-11-05: 10:00:00 40 mg via INTRAVENOUS
  Filled 2018-11-05: qty 4

## 2018-11-05 NOTE — Progress Notes (Signed)
RT note: patient does not meet SBT criteria this AM.  Tolerating current ventilator settings well.  Will continue to monitor.  

## 2018-11-05 NOTE — Progress Notes (Signed)
NAME:  Kathy BryantBriante C Buckley, MRN:  [id number], DOB:  1990/12/27, LOS: 11 ADMISSION DATE:  10/05/2018, CONSULTATION DATE: October 25, 2018 REFERRING MD: Dr. Juleen ChinaKohut, CHIEF COMPLAINT: Hypoxia  Brief History   28 year old female requiring chronic vent secondary to anoxic injury in 2019.  Recently admitted for pneumonia and returning from [facility] for worsening vent needs.  She resides at [facility] with baseline GCS is 3.  She was recently [facility] on 4/[facility] to [facility]  with worsening oxygenation.  She was ruled out for COVID-19 at that time with a negative nasopharyngeal swab.  Pneumonia was presumed to be bacterial and she was treated with cefepime, Flagyl, and vancomycin. ( Fever and tachycardia, which were the reasons for her presentation) had resolved and she was transferred back to [facility] the following day on 4/20.  Tracheal aspirate taken while inpatient was positive for multidrug-resistant Achromobacter.  Repeat aspirate during this admit showed stenotrophomonas Course complicated by seizure episode on 4/26?  Myoclonus  Past Medical History   has a past medical history of Acute respiratory failure (HCC), Anoxic brain damage (HCC), Cardiac arrest (HCC), Central corneal ulcer, left eye, Idiopathic hypotension, Tachyarrhythmia, and Viral hepatitis.  Significant Hospital Events   [facility] 4/20 Transfer to [facility] 4/23 Admit to [facility] for hypoxia 4/28 fever 101, PEEP increased to 10  Consults:  N/A  Procedures:  4/25 - tracheostomy change to 8 Portex. 4/25 - bronchoscopy shows small amount white secretions.  5/1 - Afebrile last 24 hours. She remains on PEEP of 10, FiO2 down to 60%. Urine output improved with Lasix, Na normal with free water at 400cc q4h. RN concerned about frequent desaturations but no mucus aspirated 5/2 - worsening hypoxemia. Now 80% fio2, peep 10. Not on pressors . Febrile and high wbc again +  Significant Diagnostic Tests:  EEG 4/28 >> neg  Micro  Data:  4/19 trach aspirate >> MDR Achromobacter.   4/19 - COVID negative 4/24- covid negative 4/25 tracheal aspirate - >100,000 Stenotrophomonas, 40 k pseudomonas  Antimicrobials:  Cefepime 4/19 > 4/23 Flagyl 4/19 > 4/23 Vancomycin 4/19 > 4/23 Meropenem 4/23 >>>4/26 Bactrim 4/26 >> ceftax 4/29 >>  Interim history/subjective:  On 90% FiO2 with PEEP 10.  SpO2 mid 90s this AM.  Objective   Blood pressure 115/66, pulse 98, temperature 98 F (36.7 C), temperature source Oral, resp. rate (!) 25, height 5\' 5"  (1.651 m), weight 84.6 kg, SpO2 98 %.    Vent Mode: PCV FiO2 (%):  [80 %-100 %] 90 % Set Rate:  [25 bmp] 25 bmp PEEP:  [10 cmH20] 10 cmH20 Plateau Pressure:  [37 cmH20-39 cmH20] 38 cmH20   Intake/Output Summary (Last 24 hours) at 11/05/2018 0933 Last data filed at 11/05/2018 0902 Gross per 24 hour  Intake 3709.79 ml  Output 3375 ml  Net 334.79 ml   Filed Weights   11/03/18 0500 11/04/18 0201 11/05/18 0446  Weight: 81.2 kg 80.6 kg 84.6 kg   General: Young AA female, critically and chronically ill. Neuro: Unresponsive due to severe anoxia. HEENT: [place]/AT. Sclerae anicteric, Trach in place. Cardiovascular: RRR, no M/R/G.  Lungs: Respirations even and unlabored.  CTA bilaterally, No W/R/R. Abdomen: Obese.  BS x 4, soft, NT/ND.  Musculoskeletal: No gross deformities, no edema.  Skin: Intact, warm, no rashes.   Assessment & Plan:   Acute on chronic respiratory failure - currently with worsening oxygenation felt due to VAP (hx MDR achromobacter PNA in the past, currently with stenotrophomonas and pseudomonas)  -  Continue Pressure control ventilation per [facility] settings (Achieving tidal volumes in the 300 range with pressure control of 30). - Will attempt weaning FiO2 / PEEP (currently at 90% and 10). - 40 mg lasix x 1, will likely need further dosing for goal negative balance (currently +10L). - Continue chest physiotherapy.  - Continue bronchodilators. - Continue  acetylcysteine. - Continue Bactrim x 14 ds total until 5/11 per ID. - Continue ceftaz for 7 days until 5/5.  Baseline coma with tremors due to severe anoxia - Possible seizure activity vs myoclonus (occurred while on meropenem). EEG neg. - Continue supportive care.  Hypomagnesemia. Hypophosphatemia. - Replete electrolytes. - Follow BMP.  Hyponatremia. - Lasix as above.  Anemia of chronic / critical illness. - Transfuse for Hgb < 7.  Hyperglycemia. - SSI. - D/c D5.   Best practice:  Diet: TF via PEG tube. Pain/Anxiety/Delirium protocol (if indicated): PRN VAP protocol (if indicated): bundle ordered. DVT prophylaxis: Heparin SQ GI prophylaxis: Protonix Glucose control: SSI Mobility: BR Code Status: FULL  Family Communication:  Mom Cala Bradford updated 4/29. 11/03/2018 - explained worsening ARDS. Mom absorbing information. Long 15-30 seconds silence from mom before she said "okay...hmm". Probed mom - " she said Kathy Buckley has been on a long journey and is struggling to see daughter suffer". Explained MDR pneumonia and ARDS and real bad prognosis. Empathized on mom's suffering. Mom was in tears. Recommended No CPR. Asked mom to pray about no CPR. For now full code      5/4 will call mother over phone again.                                                                                 Disposition: ICU  CC time: 40 min.   Rutherford Guys, PA [facility] Pager: [phone].  If no answer, [phone] 11/05/2018, 9:52 AM

## 2018-11-05 NOTE — Progress Notes (Signed)
Attempt x 2 to insert rectal tube as ordered. Rectal tone poor unable to retain bulb. Rectal pouch applied.

## 2018-11-06 ENCOUNTER — Encounter (HOSPITAL_COMMUNITY): Payer: Self-pay | Admitting: Neurology

## 2018-11-06 ENCOUNTER — Inpatient Hospital Stay (HOSPITAL_COMMUNITY): Payer: Medicaid Other

## 2018-11-06 ENCOUNTER — Encounter (HOSPITAL_COMMUNITY): Payer: Self-pay | Admitting: *Deleted

## 2018-11-06 DIAGNOSIS — I469 Cardiac arrest, cause unspecified: Secondary | ICD-10-CM | POA: Insufficient documentation

## 2018-11-06 LAB — CBC WITH DIFFERENTIAL/PLATELET
Abs Immature Granulocytes: 0.12 10*3/uL — ABNORMAL HIGH (ref 0.00–0.07)
Basophils Absolute: 0.1 10*3/uL (ref 0.0–0.1)
Basophils Relative: 0 %
Eosinophils Absolute: 0.4 10*3/uL (ref 0.0–0.5)
Eosinophils Relative: 2 %
HCT: 29.1 % — ABNORMAL LOW (ref 36.0–46.0)
Hemoglobin: 8 g/dL — ABNORMAL LOW (ref 12.0–15.0)
Immature Granulocytes: 1 %
Lymphocytes Relative: 13 %
Lymphs Abs: 2.3 10*3/uL (ref 0.7–4.0)
MCH: 23.7 pg — ABNORMAL LOW (ref 26.0–34.0)
MCHC: 27.5 g/dL — ABNORMAL LOW (ref 30.0–36.0)
MCV: 86.4 fL (ref 80.0–100.0)
Monocytes Absolute: 1.5 10*3/uL — ABNORMAL HIGH (ref 0.1–1.0)
Monocytes Relative: 8 %
Neutro Abs: 13.3 10*3/uL — ABNORMAL HIGH (ref 1.7–7.7)
Neutrophils Relative %: 76 %
Platelets: 221 10*3/uL (ref 150–400)
RBC: 3.37 MIL/uL — ABNORMAL LOW (ref 3.87–5.11)
RDW: 26.1 % — ABNORMAL HIGH (ref 11.5–15.5)
WBC: 17.6 10*3/uL — ABNORMAL HIGH (ref 4.0–10.5)
nRBC: 0.7 % — ABNORMAL HIGH (ref 0.0–0.2)

## 2018-11-06 LAB — BASIC METABOLIC PANEL
Anion gap: 9 (ref 5–15)
Anion gap: 4 — ABNORMAL LOW (ref 5–15)
BUN: 12 mg/dL (ref 6–20)
BUN: 12 mg/dL (ref 6–20)
CO2: 41 mmol/L — ABNORMAL HIGH (ref 22–32)
CO2: 49 mmol/L — ABNORMAL HIGH (ref 22–32)
Calcium: 8.8 mg/dL — ABNORMAL LOW (ref 8.9–10.3)
Calcium: 8.7 mg/dL — ABNORMAL LOW (ref 8.9–10.3)
Chloride: 83 mmol/L — ABNORMAL LOW (ref 98–111)
Chloride: 81 mmol/L — ABNORMAL LOW (ref 98–111)
Creatinine, Ser: 0.39 mg/dL — ABNORMAL LOW (ref 0.44–1.00)
Creatinine, Ser: 0.42 mg/dL — ABNORMAL LOW (ref 0.44–1.00)
GFR calc Af Amer: >60 mL/min (ref >60)
GFR calc Af Amer: >60 mL/min (ref >60)
GFR calc non Af Amer: >60 mL/min (ref >60)
GFR calc non Af Amer: >60 mL/min (ref >60)
Glucose, Bld: 111 mg/dL — ABNORMAL HIGH (ref 70–99)
Glucose, Bld: 93 mg/dL (ref 70–99)
Potassium: 4.4 mmol/L (ref 3.5–5.1)
Potassium: 4.3 mmol/L (ref 3.5–5.1)
Sodium: 133 mmol/L — ABNORMAL LOW (ref 135–145)
Sodium: 134 mmol/L — ABNORMAL LOW (ref 135–145)

## 2018-11-06 LAB — PHOSPHORUS: Phosphorus: 2.8 mg/dL (ref 2.5–4.6)

## 2018-11-06 LAB — GLUCOSE, CAPILLARY
Glucose-Capillary: 113 mg/dL — ABNORMAL HIGH (ref 70–99)
Glucose-Capillary: 168 mg/dL — ABNORMAL HIGH (ref 70–99)
Glucose-Capillary: 149 mg/dL — ABNORMAL HIGH (ref 70–99)
Glucose-Capillary: 107 mg/dL — ABNORMAL HIGH (ref 70–99)
Glucose-Capillary: 90 mg/dL (ref 70–99)

## 2018-11-06 LAB — MAGNESIUM: Magnesium: 1.9 mg/dL (ref 1.7–2.4)

## 2018-11-06 MED ORDER — FUROSEMIDE 10 MG/ML IJ SOLN
40.0000 mg | Freq: Four times a day (QID) | INTRAMUSCULAR | Status: AC
  Administered 2018-11-06 (×3): 40 mg via INTRAVENOUS
  Filled 2018-11-06 (×3): qty 4

## 2018-11-06 MED ORDER — MAGNESIUM SULFATE 2 GM/50ML IV SOLN
2.0000 g | Freq: Once | INTRAVENOUS | Status: AC
  Administered 2018-11-06: 11:00:00 2 g via INTRAVENOUS
  Filled 2018-11-06: qty 50

## 2018-11-06 NOTE — Progress Notes (Addendum)
NAME:  Kathy BryantBriante C Buckley, MRN:  [id number], DOB:  07/06/1990, LOS: 12 ADMISSION DATE:  10/22/2018, CONSULTATION DATE: October 25, 2018 REFERRING MD: Dr. Juleen ChinaKohut, CHIEF COMPLAINT: Hypoxia  Brief History   28 year old female requiring chronic vent secondary to anoxic injury in 2019.  Recently admitted for pneumonia and returning from [facility] for worsening vent needs.  She resides at [facility] with baseline GCS is 3.  She was recently admitted on 4/19 to [facility] with worsening oxygenation.  She was ruled out for COVID-19 at that time with a negative nasopharyngeal swab.  Pneumonia was presumed to be bacterial and she was treated with cefepime, Flagyl, and vancomycin. ( Fever and tachycardia, which were the reasons for her presentation) had resolved and she was transferred back to [facility] the following day on 4/20.  Tracheal aspirate taken while inpatient was positive for multidrug-resistant Achromobacter.  Repeat aspirate during this admit showed stenotrophomonas Course complicated by seizure episode on 4/26?  Myoclonus  Past Medical History   has a past medical history of Acute respiratory failure (HCC), Anoxic brain damage (HCC), Cardiac arrest (HCC), Central corneal ulcer, left eye, Idiopathic hypotension, Tachyarrhythmia, and Viral hepatitis.  Significant Hospital Events   [facility] 4/20 Transfer to [facility] 4/23 Admit to [facility] for hypoxia 4/28 fever 101, PEEP increased to 10  Consults:  N/A  Procedures:  4/25 - tracheostomy change to 8 Portex. 4/25 - bronchoscopy shows small amount white secretions.  5/1 - Afebrile last 24 hours. She remains on PEEP of 10, FiO2 down to 60%. Urine output improved with Lasix, Na normal with free water at 400cc q4h. RN concerned about frequent desaturations but no mucus aspirated 5/2 - worsening hypoxemia. Now 80% fio2, peep 10. Not on pressors . Febrile and high wbc again +  Significant Diagnostic Tests:  EEG 4/28 >> neg  Micro  Data:  4/19 trach aspirate >> MDR Achromobacter.   4/19 - COVID negative 4/24- covid negative 4/25 tracheal aspirate - >100,000 Stenotrophomonas, 40 k pseudomonas  Antimicrobials:  Cefepime 4/19 > 4/23 Flagyl 4/19 > 4/23 Vancomycin 4/19 > 4/23 Meropenem 4/23 >>>4/26 Bactrim 4/26 >> ceftax 4/29 >>  Interim history/subjective:  On 90% FiO2 with PEEP 10.  SpO2 mid 90s this AM.  Objective   Blood pressure 121/75, pulse (!) 107, temperature (!) 96.4 F (35.8 C), temperature source Axillary, resp. rate (!) 27, height 5\' 5"  (1.651 m), weight 80.7 kg, SpO2 92 %.    Vent Mode: PCV FiO2 (%):  [80 %-90 %] 90 % Set Rate:  [25 bmp] 25 bmp PEEP:  [10 cmH20] 10 cmH20 Plateau Pressure:  [37 cmH20-38 cmH20] 37 cmH20   Intake/Output Summary (Last 24 hours) at 11/06/2018 1026 Last data filed at 11/06/2018 1000 Gross per 24 hour  Intake 3437.32 ml  Output 3370 ml  Net 67.32 ml   Filed Weights   11/04/18 0201 11/05/18 0446 11/06/18 0458  Weight: 80.6 kg 84.6 kg 80.7 kg   General: young female, on vent  Neuro: Unresponsive, pupils 6 mm non-reactive  HEENT: Trach clean, in place  Cardiovascular: RRR, no M/R/G.  Lungs: diminished breath sounds  Abdomen: Obese. Soft, active bowel sounds  Musculoskeletal: No gross deformities, no edema.  Skin: Intact, warm, no rashes.   Assessment & Plan:   Acute on chronic respiratory failure - currently with worsening oxygenation felt due to VAP (hx MDR achromobacter PNA in the past, currently with stenotrophomonas and pseudomonas)  - Continue Pressure control ventilation per [facility]  settings (Achieving tidal volumes in the 300 range with pressure control of 30). - Will attempt weaning FiO2 / PEEP (currently at 90% and 10). - Lasix 40 meq every 6 hours x 3  - Continue chest physiotherapy.  - Continue bronchodilators. - Continue acetylcysteine. - Continue Bactrim x 14 ds total until 5/11 per ID. - Continue ceftaz for 14 days until 5/12.  Baseline coma  with tremors due to severe anoxia - Possible seizure activity vs myoclonus (occurred while on meropenem). EEG neg. - Continue supportive care - Family asking for Neurology Consult   Hypomagnesemia. Hypophosphatemia. Hypervolemia Hyponatremia > Improving  - Replete electrolytes. - Follow BMP - Lasix as above.  Anemia of chronic / critical illness. - Trend CBC  - Transfuse for Hgb < 7.  Hyperglycemia. - SSI. - Trend glucose    Best practice:  Diet: TF via PEG tube. VAP protocol (if indicated): bundle ordered. DVT prophylaxis: Heparin SQ GI prophylaxis: Protonix Glucose control: SSI Mobility: BR Code Status: FULL  Family Communication:  Updated mom. Mom is very tearful. Wants further Neuro work-up or for Neurology to call her and go over prognosis. However she amendable to DNR however wants to speak with husband first.                                                                                Disposition: ICU  CC Time:  Jovita Kussmaul, AGACNP-BC [facility]  Pgr: [phone]  PCCM Pgr: [phone]

## 2018-11-06 NOTE — Progress Notes (Signed)
CSW received call from Victor at [facility] regarding patient. CSW provided [place] with brief update about patient's current medical condition. At this time, the patient still has a bed available at [facility].  Edwin Dada, MSW, LCSW-A Clinical Social Worker Emergency Department [employer] [phone]

## 2018-11-06 NOTE — Significant Event (Signed)
After MRI findings, spoke to Mother Cala Bradford on phone with Neurologist, Dr. Otelia Limes. Mother was told MRI findings and prognosis. Very tearful. Understanding of condition. Does not want to make goals of care decision at this time.

## 2018-11-06 NOTE — Progress Notes (Signed)
RT note: patient does not meet SBT criteria for this AM.  Tolerating current ventilator settings well.  Will continue to monitor.  

## 2018-11-06 NOTE — Progress Notes (Signed)
Patient transported to MRI and back to room 2M05 without complications.

## 2018-11-06 NOTE — Consult Note (Addendum)
Neurology Consultation  Reason for Consult: Patient in comatose state Referring Physician: Dr. Delton Coombes   History is obtained from: Chart  HPI: Kathy Buckley is a 28 y.o. female with history of viral hepatitis, tachyarrhythmia, idiopathic hypotension, central corneal ulcer, cardiac arrest, anoxic brain injury and respiratory failure.  Patient resides at [facility] with a baseline GCS of 3.  Patient was recently admitted to the hospital on 4/19 due to worsening oxygenation.  She was ruled out for COVID-19 at that time.  Pneumonia was presumed to be bacterial and she was treated with cefepime, Flagyl and vancomycin.  Tracheal aspirate was taken and was positive for multi-drug-resistant Achromobacter then repeat aspirate showed stenotrophomonas  Patient significant brain injury occurred secondary to anoxic brain injury following PEA arrest which was believed to be postpartum complications in 10/2017 after delivering an infant at home to which she was unaware of her pregnancy.  At that time this included severe postpartum hemorrhage.  She was found unresponsive by her mother.  It appears that she received her care at [facility].  Per their notes- 4/26: Patient with worsening hypertension despite treatment with precipitous drop in HR, hypotension, blown pupils, and PEA arrest concerning for brain herniation. She received 1 round CPR and 1 of epi before achieving ROSC. Elevated ICP treatment immediately instituted with 23% saline bullet, HOB up, neck midline, pt with already receiving appropriate CO2 levels. Pupils returned to previous fixed and small, head CT with worsening edema and crowding of cerebral structures. Patient started on vasopressors to maintain BP. 4/30: Steroids/Synthroid/Vaso stopped per neuro recs. Free water started for hypernatremia. Neuro ICU attending reviewed the results of the MRI Brain results with patient's parents 5/3: Family discussion with Mother and father.  Discussed potential options for care: transition to comfort/ change in code status to DNR limited: continue with full treatment options and schedule Trach/peg for beginning of next week. At this time there is no change to current plan  Disposition at time of discharge was to [facility]. 11/22/2017   ROS:  Unable to obtain due to altered mental status.   Past Medical History:  Diagnosis Date  . Acute respiratory failure (HCC)   . Anoxic brain damage (HCC)   . Cardiac arrest (HCC)   . Central corneal ulcer, left eye   . Idiopathic hypotension   . Tachyarrhythmia   . Viral hepatitis     Family History  Problem Relation Age of Onset  . Hypertension Mother   . Hypertension Father      Social History:   reports previous alcohol use. She reports previous drug use. No history on file for tobacco.  Medications  Current Facility-Administered Medications:  .  0.9 %  sodium chloride infusion, , Intravenous, PRN, Scatliffe, Gypsy Balsam, MD, Stopped at 11/06/18 [phone] .  acetaminophen (TYLENOL) tablet 650 mg, 650 mg, Oral, Q6H PRN, Oretha Milch, MD, 650 mg at 11/03/18 0037 .  albuterol (PROVENTIL) (2.5 MG/3ML) 0.083% nebulizer solution 2.5 mg, 2.5 mg, Nebulization, Q2H PRN, Agarwala, Ravi, MD .  cefTAZidime (FORTAZ) 2 g in sodium chloride 0.9 % 100 mL IVPB, 2 g, Intravenous, Q8H, Eubanks, Ozzie Hoyle, NP, Stopped at 11/06/18 [phone] .  famotidine (PEPCID) IVPB 20 mg premix, 20 mg, Intravenous, Q12H, Ogan, Okoronkwo U, MD, Last Rate: 100 mL/hr at 11/06/18 1000 .  feeding supplement (OSMOLITE 1.2 CAL) liquid 1,000 mL, 1,000 mL, Per Tube, Q24H, Mannam, Praveen, MD, Last Rate: 40 mL/hr at 11/06/18 0800 .  feeding supplement (PRO-STAT SUGAR FREE  64) liquid 60 mL, 60 mL, Per Tube, BID, Mannam, Praveen, MD, 60 mL at 11/06/18 0935 .  furosemide (LASIX) injection 40 mg, 40 mg, Intravenous, Q6H, Tobey GrimEubanks, Katalina M, NP, 40 mg at 11/06/18 1101 .  heparin injection 5,000 Units, 5,000 Units, Subcutaneous, Q8H,  Duayne CalHoffman, Paul W, NP, 5,000 Units at 11/06/18 0551 .  insulin aspart (novoLOG) injection 0-15 Units, 0-15 Units, Subcutaneous, Q4H, Deterding, Dorise HissElizabeth C, MD, 3 Units at 11/06/18 0820 .  ipratropium-albuterol (DUONEB) 0.5-2.5 (3) MG/3ML nebulizer solution 3 mL, 3 mL, Nebulization, TID, Marchelle Gearingamaswamy, Murali, MD, 3 mL at 11/06/18 0740 .  magnesium sulfate IVPB 2 g 50 mL, 2 g, Intravenous, Once, Tobey GrimEubanks, Katalina M, NP, Last Rate: 50 mL/hr at 11/06/18 1101, 2 g at 11/06/18 1101 .  MEDLINE mouth rinse, 15 mL, Mouth Rinse, 10 times per day, Scatliffe, Kristen D, MD, 15 mL at 11/06/18 0936 .  scopolamine (TRANSDERM-SCOP) 1 MG/3DAYS 1.5 mg, 1 patch, Transdermal, Q72H, Agarwala, Ravi, MD, 1.5 mg at 11/05/18 0901 .  senna-docusate (Senokot-S) tablet 2 tablet, 2 tablet, Oral, BID, Kalman Shanamaswamy, Murali, MD, 2 tablet at 11/04/18 2126 .  sodium chloride flush (NS) 0.9 % injection 10-40 mL, 10-40 mL, Intracatheter, Q12H, Mannam, Praveen, MD, 10 mL at 11/06/18 0935 .  sodium chloride flush (NS) 0.9 % injection 10-40 mL, 10-40 mL, Intracatheter, PRN, Mannam, Praveen, MD .  sulfamethoxazole-trimethoprim (BACTRIM) 386.08 mg in dextrose 5 % 500 mL IVPB, 15 mg/kg/day, Intravenous, Q8H, Oretha MilchAlva, Rakesh V, MD, Stopped at 11/06/18 0617   Exam: Current vital signs: BP 121/78   Pulse (!) 107   Temp (!) 97.2 F (36.2 C) (Axillary)   Resp (!) 27   Ht 5\' 5"  (1.651 m)   Wt 80.7 kg   SpO2 91%   BMI 29.61 kg/m  Vital signs in last 24 hours: Temp:  [96.4 F (35.8 C)-98.4 F (36.9 C)] 97.2 F (36.2 C) (05/05 1114) Pulse Rate:  [93-115] 107 (05/05 1000) Resp:  [23-28] 27 (05/05 0900) BP: (93-153)/(53-128) 121/78 (05/05 1000) SpO2:  [88 %-98 %] 91 % (05/05 1000) FiO2 (%):  [80 %-90 %] 90 % (05/05 0744) Weight:  [80.7 kg] 80.7 kg (05/05 0458)  Physical Exam  Constitutional: Appears well-developed and well-nourished.  Eyes: No scleral injection HENT: Corneal edema Head: Normocephalic.  Cardiovascular: Normal rate and  regular rhythm.  Respiratory: On ventilator GI: Soft.  No distension. There is no tenderness.  Skin: WDI  Neuro: Mental Status: Patient does not respond to verbal stimuli.  Does not respond to deep sternal rub.  Does not follow commands.  No verbalizations noted.  Is not breathing over the vent Cranial Nerves: II: patient does not respond confrontation bilaterally,  III,IV,VI: doll's response absent bilaterally. pupils right 6 mm, left 8 mm, round and nonreactive bilaterally. Exotropia noted.  V,VII: Corneal reflex absent bilaterally. Lower quadrants of face flaccidly symmetric with decreased tone of muscles of facial expression and jaw.  VIII: patient does not respond to verbal stimuli IX,X: gag reflex absent XI: trapezius strength unable to test bilaterally XII: tongue strength unable to test Motor: Increased extensor tone at elbows bilaterally; increased flexor tone of digits bilaterally with hands in clenched position at baseline.  Flaccid tone of bilateral lower extremities.  No spontaneous or stimulus-induced movement noted.  With manipulation of hand she shows intermittent tremors along with contraction of bilateral fingers. Sensory: Does not respond to noxious stimuli in any extremity. Deep Tendon Reflexes:  3+ biceps and brachioradialis bilaterally.  Absent patellar and achilles reflexes.  Plantars: Mute bilaterally Cerebellar/Gait: Unable to perform  Labs I have reviewed labs in epic and the results pertinent to this consultation are:   CBC    Component Value Date/Time   WBC 17.6 (H) 11/06/2018 0432   RBC 3.37 (L) 11/06/2018 0432   HGB 8.0 (L) 11/06/2018 0432   HCT 29.1 (L) 11/06/2018 0432   PLT 221 11/06/2018 0432   MCV 86.4 11/06/2018 0432   MCH 23.7 (L) 11/06/2018 0432   MCHC 27.5 (L) 11/06/2018 0432   RDW 26.1 (H) 11/06/2018 0432   LYMPHSABS 2.3 11/06/2018 0432   MONOABS 1.5 (H) 11/06/2018 0432   EOSABS 0.4 11/06/2018 0432   BASOSABS 0.1 11/06/2018 0432     CMP     Component Value Date/Time   NA 133 (L) 11/06/2018 0432   K 4.4 11/06/2018 0432   CL 83 (L) 11/06/2018 0432   CO2 41 (H) 11/06/2018 0432   GLUCOSE 111 (H) 11/06/2018 0432   BUN 12 11/06/2018 0432   CREATININE 0.39 (L) 11/06/2018 0432   CALCIUM 8.8 (L) 11/06/2018 0432   PROT 9.2 (H) 11/03/2018 0447   ALBUMIN 1.4 (L) 11/03/2018 0447   AST 31 11/03/2018 0447   ALT 20 11/03/2018 0447   ALKPHOS 133 (H) 11/03/2018 0447   BILITOT 0.2 (L) 11/03/2018 0447   GFRNONAA >60 11/06/2018 0432   GFRAA >60 11/06/2018 0432    Lipid Panel     Component Value Date/Time   TRIG 102 10/21/2018 2211   EEG: This recording shows severe voltage suppression and background slowing indicating a severe encephalopathy.  No epileptiform discharges are observed.  Imaging  MRI examination of the brain-pending  Felicie Morn PA-C [facility] [phone] 11/06/2018, 11:46 AM     Assessment:  This is a very unfortunate 28 year old female with a history of anoxic brain injury status post cardiac arrest at the time of an unintended home childbirth in 2019.  1. During her initial admission at [facility] in 2019 for her cardiac arrest, she was diagnosed with anoxic brain injury and in fact almost herniated, with Neuro ICU protocols reversing imminent herniation using hypertonic saline and hyperventilation.  2. At that time her mother wanted full level of care which included trach, PEG and transfer to LTAC.   3. As noted she has returned to [facility] secondary to pneumonia.  Currently exam shows no brainstem or cortically-mediated functions.  Recommendations: - At this time we will order an MRI brain as that has not been ordered for quite some time and also to further help family make decisions regarding her goals of care.  - We will follow MRI and have meeting with family along with PCCM -- Her exam is consistent with absent brainstem function except for one yawning episode seen spontaneously.  She also has no evidence of cortical function. Her prognosis for neurological recovery is dismal.    Addendum:  MRI completed. I have personally reviewed the images and agree with the Radiology report conclusions as follows: 1. Severe diffuse cerebral atrophy with associated cortical thinning and superimposed areas of multifocal cystic encephalomalacia as above. Findings most likely related to history of severe anoxic brain injury. 2. Marked diffuse ventriculomegaly related to diffuse parenchymal volume loss without hydrocephalus. 3. No superimposed acute intracranial abnormality. 4. Bilateral mastoid effusions.  I have examined the patient and formulated the assessment and plan. 28 year old female with anoxic brain injury and dismal prognosis. MRI brain shows chronic atrophy consistent with this diagnosis.  Electronically signed: Dr. Minerva Areola  Otelia Limes

## 2018-11-07 LAB — GLUCOSE, CAPILLARY
Glucose-Capillary: 113 mg/dL — ABNORMAL HIGH (ref 70–99)
Glucose-Capillary: 113 mg/dL — ABNORMAL HIGH (ref 70–99)
Glucose-Capillary: 116 mg/dL — ABNORMAL HIGH (ref 70–99)
Glucose-Capillary: 127 mg/dL — ABNORMAL HIGH (ref 70–99)
Glucose-Capillary: 156 mg/dL — ABNORMAL HIGH (ref 70–99)
Glucose-Capillary: 217 mg/dL — ABNORMAL HIGH (ref 70–99)
Glucose-Capillary: 224 mg/dL — ABNORMAL HIGH (ref 70–99)

## 2018-11-07 LAB — CARBAPENEM RESISTANCE PANEL
Carba Resistance IMP Gene: NOT DETECTED
Carba Resistance KPC Gene: DETECTED — AB
Carba Resistance NDM Gene: NOT DETECTED
Carba Resistance OXA48 Gene: NOT DETECTED
Carba Resistance VIM Gene: NOT DETECTED

## 2018-11-07 LAB — BASIC METABOLIC PANEL
Anion gap: 12 (ref 5–15)
BUN: 14 mg/dL (ref 6–20)
CO2: 46 mmol/L — ABNORMAL HIGH (ref 22–32)
Calcium: 8.6 mg/dL — ABNORMAL LOW (ref 8.9–10.3)
Chloride: 77 mmol/L — ABNORMAL LOW (ref 98–111)
Creatinine, Ser: 0.38 mg/dL — ABNORMAL LOW (ref 0.44–1.00)
GFR calc Af Amer: >60 mL/min (ref >60)
GFR calc non Af Amer: >60 mL/min (ref >60)
Glucose, Bld: 122 mg/dL — ABNORMAL HIGH (ref 70–99)
Potassium: 3.7 mmol/L (ref 3.5–5.1)
Sodium: 135 mmol/L (ref 135–145)

## 2018-11-07 LAB — CBC
HCT: 30.2 % — ABNORMAL LOW (ref 36.0–46.0)
Hemoglobin: 8.2 g/dL — ABNORMAL LOW (ref 12.0–15.0)
MCH: 23.4 pg — ABNORMAL LOW (ref 26.0–34.0)
MCHC: 27.2 g/dL — ABNORMAL LOW (ref 30.0–36.0)
MCV: 86 fL (ref 80.0–100.0)
Platelets: 276 10*3/uL (ref 150–400)
RBC: 3.51 MIL/uL — ABNORMAL LOW (ref 3.87–5.11)
RDW: 26.2 % — ABNORMAL HIGH (ref 11.5–15.5)
WBC: 18 10*3/uL — ABNORMAL HIGH (ref 4.0–10.5)
nRBC: 0.9 % — ABNORMAL HIGH (ref 0.0–0.2)

## 2018-11-07 LAB — PHOSPHORUS: Phosphorus: 2.4 mg/dL — ABNORMAL LOW (ref 2.5–4.6)

## 2018-11-07 LAB — MAGNESIUM: Magnesium: 1.6 mg/dL — ABNORMAL LOW (ref 1.7–2.4)

## 2018-11-07 MED ORDER — ARTIFICIAL TEARS OPHTHALMIC OINT
TOPICAL_OINTMENT | OPHTHALMIC | Status: DC | PRN
  Administered 2018-11-09: 01:00:00 via OPHTHALMIC
  Administered 2018-11-09: 1 via OPHTHALMIC
  Administered 2018-11-11 – 2018-11-12 (×3): via OPHTHALMIC
  Administered 2018-11-15: 1 via OPHTHALMIC
  Filled 2018-11-07: qty 3.5

## 2018-11-07 MED ORDER — DEXTROSE 5 % IV SOLN
2.5000 g | Freq: Three times a day (TID) | INTRAVENOUS | Status: AC
  Administered 2018-11-07 – 2018-11-13 (×20): 2.5 g via INTRAVENOUS
  Filled 2018-11-07 (×26): qty 12

## 2018-11-07 NOTE — Progress Notes (Signed)
eLink Physician-Brief Progress Note Patient Name: Kathy Buckley DOB: 1991-06-13 MRN: [id number]   Date of Service  11/07/2018  HPI/Events of Note  Needs Lacrilube for her eyes  eICU Interventions  Order entered.        Thomasene Lot Kathy Buckley 11/07/2018, 9:34 PM

## 2018-11-07 NOTE — Progress Notes (Signed)
NAME:  Kathy Buckley, MRN:  [id number], DOB:  1991/02/03, LOS: 13 ADMISSION DATE:  10/31/2018, CONSULTATION DATE: October 25, 2018 REFERRING MD: Dr. Juleen ChinaKohut, CHIEF COMPLAINT: Hypoxia  Brief History   28 year old female requiring chronic vent secondary to anoxic injury in 2019.  Recently admitted for pneumonia and returning from [facility] for worsening vent needs.  She resides at [facility] with baseline GCS is 3.  She was recently admitted on 4/19 to [facility] with worsening oxygenation.  She was ruled out for COVID-19 at that time with a negative nasopharyngeal swab.  Pneumonia was presumed to be bacterial and she was treated with cefepime, Flagyl, and vancomycin. ( Fever and tachycardia, which were the reasons for her presentation) had resolved and she was transferred back to [facility] the following day on 4/20.  Tracheal aspirate taken while inpatient was positive for multidrug-resistant Achromobacter.  Repeat aspirate during this admit showed stenotrophomonas Course complicated by seizure episode on 4/26?  Myoclonus  Past Medical History   has a past medical history of Acute respiratory failure (HCC), Anoxic brain damage (HCC), Cardiac arrest (HCC), Central corneal ulcer, left eye, Idiopathic hypotension, Tachyarrhythmia, and Viral hepatitis.  Significant Hospital Events   [facility] for [facility] 4/20 Transfer to [facility] 4/23 Admit to [facility] for hypoxia 4/28 fever 101, PEEP increased to 10  Consults:  N/A  Procedures:  4/25 - tracheostomy change to 8 Portex. 4/25 - bronchoscopy shows small amount white secretions.  5/1 - Afebrile last 24 hours. She remains on PEEP of 10, FiO2 down to 60%. Urine output improved with Lasix, Na normal with free water at 400cc q4h. RN concerned about frequent desaturations but no mucus aspirated 5/2 - worsening hypoxemia. Now 80% fio2, peep 10. Not on pressors . Febrile and high wbc again +  Significant Diagnostic Tests:  EEG 4/28 >> neg  Micro  Data:  4/19 trach aspirate >> MDR Achromobacter.   4/19 - COVID negative 4/24- covid negative 4/25 tracheal aspirate - >100,000 Stenotrophomonas, 40 k pseudomonas  Antimicrobials:  Cefepime 4/19 > 4/23 Flagyl 4/19 > 4/23 Vancomycin 4/19 > 4/23 Meropenem 4/23 >>>4/26 Bactrim 4/26 >> ceftax 4/29 >>  Interim history/subjective:  No significant changes this morning.  Her FiO2 has been decreased to 0.85. She remains on pressure control ventilation, tidal volumes in the 200s  Objective   Blood pressure 118/69, pulse (!) 107, temperature 98.1 F (36.7 C), temperature source Oral, resp. rate 20, height 5\' 5"  (1.651 m), weight 78.6 kg, SpO2 91 %.    Vent Mode: PCV FiO2 (%):  [85 %-90 %] 85 % Set Rate:  [25 bmp] 25 bmp PEEP:  [10 cmH20] 10 cmH20 Plateau Pressure:  [35 cmH20-38 cmH20] 37 cmH20   Intake/Output Summary (Last 24 hours) at 11/07/2018 1433 Last data filed at 11/07/2018 1200 Gross per 24 hour  Intake 2084.02 ml  Output 1740 ml  Net 344.02 ml   Filed Weights   11/05/18 0446 11/06/18 0458 11/07/18 0500  Weight: 84.6 kg 80.7 kg 78.6 kg   General: Ill-appearing obese woman, ventilated Neuro: Comatose, pupils 6 mm HEENT: Trach site clean, no exudate Cardiovascular: Regular, no murmur, trace pretibial edema Lungs: Breath sounds heard anteriorly, decreased at bases Abdomen: Obese, soft, nondistended, positive bowel sounds Musculoskeletal: No deformities Skin: No rash   Assessment & Plan:   Acute on chronic respiratory failure - currently with worsening oxygenation felt due to VAP (hx MDR achromobacter PNA in the past, currently with stenotrophomonas and pseudomonas)  Suspect  that at least a contributor to her persistent hypoxemia is progressive atelectasis.  Will change PCV (achieving tidal volumes in the 200s) to PRVC, tidal volume 400, continue PEEP 10 Start recruitment maneuvers backslash CT Continue diuresis, repeat Lasix 40 mg every 6 hours for another day, dose daily  Chest PT Acetylcysteine completed, bronchodilators as ordered Antibiotics: Bactrim, ceftazidime as directed by ID.  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 EEG neg. Neurology consulted to assist the family with come to terms with her coma, poor prognosis for improvement.  An MRI of the brain was done and we will revisit discussions with parents regarding continued care versus a transition to comfort  Hypomagnesemia. Hypophosphatemia. Hypervolemia Hyponatremia > Improving  Replace electrolytes as indicated Follow BMP, urine output on diuretic  Anemia of chronic / critical illness. Follow CBC Transfusion goal hemoglobin > 7  Hyperglycemia. Continue sliding scale insulin per protocol   Best practice:  Diet: TF via PEG tube. VAP protocol (if indicated): bundle ordered. DVT prophylaxis: Heparin SQ GI prophylaxis: Protonix Glucose control: SSI Mobility: BR Code Status: FULL Family Communication: Mother updated on 5/5, planning further discussions in conjunction with neurology regarding her overall prognosis.  They are considering DNR status, possibly even withdrawal of care, but were not ready to make that decision formal yet                                              Disposition: ICU  Independent critical care time 33 minutes  Levy Pupa, MD, PhD 11/07/2018, 2:41 PM [place] [facility] [phone] or if no answer [phone]

## 2018-11-08 ENCOUNTER — Inpatient Hospital Stay (HOSPITAL_COMMUNITY): Payer: Medicaid Other

## 2018-11-08 LAB — CBC
HCT: 28.4 % — ABNORMAL LOW (ref 36.0–46.0)
Hemoglobin: 7.7 g/dL — ABNORMAL LOW (ref 12.0–15.0)
MCH: 23.2 pg — ABNORMAL LOW (ref 26.0–34.0)
MCHC: 27.1 g/dL — ABNORMAL LOW (ref 30.0–36.0)
MCV: 85.5 fL (ref 80.0–100.0)
Platelets: 278 10*3/uL (ref 150–400)
RBC: 3.32 MIL/uL — ABNORMAL LOW (ref 3.87–5.11)
RDW: 26.5 % — ABNORMAL HIGH (ref 11.5–15.5)
WBC: 19.5 10*3/uL — ABNORMAL HIGH (ref 4.0–10.5)
nRBC: 0.7 % — ABNORMAL HIGH (ref 0.0–0.2)

## 2018-11-08 LAB — GLUCOSE, CAPILLARY
Glucose-Capillary: 111 mg/dL — ABNORMAL HIGH (ref 70–99)
Glucose-Capillary: 121 mg/dL — ABNORMAL HIGH (ref 70–99)
Glucose-Capillary: 93 mg/dL (ref 70–99)
Glucose-Capillary: 145 mg/dL — ABNORMAL HIGH (ref 70–99)
Glucose-Capillary: 119 mg/dL — ABNORMAL HIGH (ref 70–99)
Glucose-Capillary: 146 mg/dL — ABNORMAL HIGH (ref 70–99)

## 2018-11-08 LAB — BASIC METABOLIC PANEL
Anion gap: 5 (ref 5–15)
BUN: 10 mg/dL (ref 6–20)
CO2: 48 mmol/L — ABNORMAL HIGH (ref 22–32)
Calcium: 9 mg/dL (ref 8.9–10.3)
Chloride: 76 mmol/L — ABNORMAL LOW (ref 98–111)
Creatinine, Ser: 0.3 mg/dL — ABNORMAL LOW (ref 0.44–1.00)
GFR calc Af Amer: >60 mL/min (ref >60)
GFR calc non Af Amer: >60 mL/min (ref >60)
Glucose, Bld: 192 mg/dL — ABNORMAL HIGH (ref 70–99)
Potassium: 4.5 mmol/L (ref 3.5–5.1)
Sodium: 129 mmol/L — ABNORMAL LOW (ref 135–145)

## 2018-11-08 LAB — PHOSPHORUS: Phosphorus: 2.6 mg/dL (ref 2.5–4.6)

## 2018-11-08 LAB — MAGNESIUM: Magnesium: 1.5 mg/dL — ABNORMAL LOW (ref 1.7–2.4)

## 2018-11-08 MED ORDER — MAGNESIUM SULFATE 4 GM/100ML IV SOLN
4.0000 g | Freq: Once | INTRAVENOUS | Status: AC
  Administered 2018-11-08: 4 g via INTRAVENOUS
  Filled 2018-11-08 (×2): qty 100

## 2018-11-08 MED ORDER — FAMOTIDINE 40 MG/5ML PO SUSR
20.0000 mg | Freq: Two times a day (BID) | ORAL | Status: DC
  Administered 2018-11-08 – 2018-11-19 (×23): 20 mg
  Filled 2018-11-08 (×26): qty 2.5

## 2018-11-08 NOTE — Progress Notes (Addendum)
NAME:  Kathy Buckley, MRN:  [id number], DOB:  18-Oct-1990, LOS: 14 ADMISSION DATE:  10/12/2018, CONSULTATION DATE: October 25, 2018 REFERRING MD: Dr. 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  Recently admitted for pneumonia and returning from [facility] for worsening vent needs.  She resides at [facility] with baseline GCS is 3.  She was recently admitted on 4/19 to [facility] with worsening oxygenation.  She was ruled out for COVID-19 at that time with a negative nasopharyngeal swab.  Pneumonia was presumed to be bacterial and she was treated with cefepime, Flagyl, and vancomycin. ( Fever and tachycardia, which were the reasons for her presentation) had resolved and she was transferred back to [facility] the following day on 4/20.  Tracheal aspirate taken while inpatient was positive for multidrug-resistant Achromobacter.  Repeat aspirate during this admit showed stenotrophomonas Course complicated by seizure episode on 4/26?  Myoclonus  Past Medical History   has a past medical history of Acute respiratory failure (HCC), Anoxic brain damage (HCC), Cardiac arrest (HCC), Central corneal ulcer, left eye, Idiopathic hypotension, Tachyarrhythmia, and Viral hepatitis.  Significant Hospital Events   4/19 admit to [facility] for PNA 4/20 Transfer to [facility] 4/23 Admit to [facility] for hypoxia 4/28 fever 101, PEEP increased to 10  Consults:  N/A  Procedures:  4/25 - tracheostomy change to 8 Portex. 4/25 - bronchoscopy shows small amount white secretions.  5/1 - Afebrile last 24 hours. She remains on PEEP of 10, FiO2 down to 60%. Urine output improved with Lasix, Na normal with free water at 400cc q4h. RN concerned about frequent desaturations but no mucus aspirated 5/2 - worsening hypoxemia. Now 80% fio2, peep 10. Not on pressors . Febrile and high wbc again + 5/6 - No significant changes this morning.  Her FiO2 has been  decreased to 0.85. She remains on pressure control ventilation, tidal volumes in the 200s  Significant Diagnostic Tests:  EEG 4/28 >> neg  Micro Data:  4/19 trach aspirate >> MDR Achromobacter.   4/19 - COVID negative 4/24- covid negative 4/25 tracheal aspirate - >100,000 Stenotrophomonas, 40 k pseudomonas  Antimicrobials:  Cefepime 4/19 > 4/23 Flagyl 4/19 > 4/23 Vancomycin 4/19 > 4/23 Meropenem 4/23 >>>4/26 Bactrim 4/26 >> ceftax 4/29 >>  Interim history/subjective:    11/08/2018 -  80% fio2, peep up at 10. RT says lungs very stiff . Worsenign cavitary lung disease on left  . Mom informed   Objective   Blood pressure 113/73, pulse (!) 107, temperature (!) 97.5 F (36.4 C), temperature source Axillary, resp. rate 20, height 5\' 5"  (1.651 m), weight 79 kg, SpO2 97 %.    Vent Mode: PRVC FiO2 (%):  [85 %-100 %] 85 % Set Rate:  [20 bmp-25 bmp] 20 bmp Vt Set:  [400 mL] 400 mL PEEP:  [10 cmH20] 10 cmH20 Plateau Pressure:  [36 cmH20-55 cmH20] 44 cmH20   Intake/Output Summary (Last 24 hours) at 11/08/2018 1058 Last data filed at 11/08/2018 0600 Gross per 24 hour  Intake 2542.89 ml  Output 1005 ml  Net 1537.89 ml   Filed Weights   11/06/18 0458 11/07/18 0500 11/08/18 0500  Weight: 80.7 kg 78.6 kg 79 kg    General Appearance:  Looks criticall ill OBESE - + Head:  Normocephalic, without obvious abnormality, atraumatic Eyes:  PERRL - yes, conjunctiva/corneas - muddy     Ears:  Normal external ear canals, both ears Nose:  G  tube - no Throat:  ETT TUBE - no , OG tube - no. TRACH + Neck:  Supple,  No enlargement/tenderness/nodules Lungs: Clear to auscultation bilaterally, Ventilator   Synchrony - yes Heart:  S1 and S2 normal, no murmur, CVP - no.  Pressors - no Abdomen:  Soft, no masses, no organomegaly. PEG + Genitalia / Rectal:  Not done Extremities:  Extremities- intact Skin:  ntact in exposed areas . Sacral area - not examined Neurologic:  Sedation - none -> RASS -  -4      LABS    PULMONARY Recent Labs  Lab 11/02/18 1126  PHART 7.433  PCO2ART 57.3*  PO2ART 59.0*  HCO3 38.3*  TCO2 40*  O2SAT 90.0    CBC Recent Labs  Lab 11/06/18 0432 11/07/18 0406 11/08/18 0459  HGB 8.0* 8.2* 7.7*  HCT 29.1* 30.2* 28.4*  WBC 17.6* 18.0* 19.5*  PLT 221 276 278    COAGULATION Recent Labs  Lab 11/03/18 0447  INR 1.4*    CARDIAC  No results for input(s): TROPONINI in the last 168 hours. No results for input(s): PROBNP in the last 168 hours.   CHEMISTRY Recent Labs  Lab 11/04/18 0445 11/05/18 0000 11/05/18 0533 11/06/18 0432 11/06/18 1803 11/07/18 0406 11/08/18 0459  NA 129* 131*  --  133* 134* 135 129*  K 4.9 4.4  --  4.4 4.3 3.7 4.5  CL 87* 88*  --  83* 81* 77* 76*  CO2 32 39*  --  41* 49* 46* 48*  GLUCOSE 259* 167*  --  111* 93 122* 192*  BUN 16 15  --  12 12 14 10   CREATININE 0.52 0.52  --  0.39* 0.42* 0.38* 0.30*  CALCIUM 8.2* 8.2*  --  8.8* 8.7* 8.6* 9.0  MG 2.0  --  1.6* 1.9  --  1.6* 1.5*  PHOS 3.1  --  1.8* 2.8  --  2.4* 2.6   Estimated Creatinine Clearance: 111.7 mL/min (A) (by C-G formula based on SCr of 0.3 mg/dL (L)).   LIVER Recent Labs  Lab 11/03/18 0447  AST 31  ALT 20  ALKPHOS 133*  BILITOT 0.2*  PROT 9.2*  ALBUMIN 1.4*  INR 1.4*     INFECTIOUS Recent Labs  Lab 11/03/18 0039  LATICACIDVEN 1.4     ENDOCRINE CBG (last 3)  Recent Labs    11/07/18 2336 11/08/18 0334 11/08/18 0727  GLUCAP 217* 111* 121*         IMAGING x48h  - image(s) personally visualized  -   highlighted in bold Mr Brain Wo Contrast  Result Date: 11/06/2018 CLINICAL DATA:  Initial evaluation for altered mental status, history of previous anoxic brain injury. EXAM: MRI HEAD WITHOUT CONTRAST TECHNIQUE: Multiplanar, multiecho pulse sequences of the brain and surrounding structures were obtained without intravenous contrast. COMPARISON:  None available. FINDINGS: Brain: Severe diffuse atrophy with loss of brain  parenchyma seen throughout the bilateral cerebral and cerebellar hemispheres. Associated diffuse cortical thinning with diffuse signal abnormality throughout the underlying cerebral white matter. Superimposed multifocal areas of cystic encephalomalacia seen involving the bilateral lentiform nuclei, thalami, anterior temporal horns, and bilateral cerebellar hemispheres. Patchy involvement of the brainstem. Associated market ventriculomegaly, most likely related to severe parenchymal volume loss. Overall, constellation of findings likely related to history of prior anoxic brain injury. She No abnormal foci of restricted diffusion to suggest superimposed acute or subacute ischemia. No foci of susceptibility artifact to suggest acute or chronic intracranial hemorrhage. No mass lesion, mass effect,  or midline shift. No extra-axial fluid collection. Pituitary gland within normal limits. Midline structures intact. Vascular: Major intracranial vascular flow voids maintained. Skull and upper cervical spine: Craniocervical junction within normal limits. Upper cervical spine normal. Bone marrow signal intensity diffusely decreased on T1 weighted imaging, most commonly related to anemia, smoking, or obesity. No focal marrow replacing lesion. Scalp soft tissues demonstrate no acute finding. Sinuses/Orbits: Globes and orbital soft tissues demonstrate no acute abnormality. Mild chronic mucosal thickening within the ethmoidal air cells and maxillary sinuses. Bilateral mastoid effusions noted. Inner ear structures grossly normal. Other: None. IMPRESSION: 1. Severe diffuse cerebral atrophy with associated cortical thinning and superimposed areas of multifocal cystic encephalomalacia as above. Findings most likely related to history of severe anoxic brain injury. 2. Marked diffuse ventriculomegaly related to diffuse parenchymal volume loss without hydrocephalus. 3. No superimposed acute intracranial abnormality. 4. Bilateral mastoid  effusions. Electronically Signed   By: Rise Mu M.D.   On: 11/06/2018 15:58   Dg Chest Port 1 View  Result Date: 11/08/2018 CLINICAL DATA:  Acute respiratory failure EXAM: PORTABLE CHEST 1 VIEW COMPARISON:  Four days ago FINDINGS: Tracheostomy tube in place. Left upper extremity PICC with tip at the upper cavoatrial junction. Bilateral airspace disease with reticulonodular appearance. A cavitary lesion is seen the lower left chest. Dextrocardia. Probable pleural fluid on both sides. No pneumothorax. IMPRESSION: Bilateral pneumonia with cavitary features on the left. Electronically Signed   By: Marnee Spring M.D.   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  Plan for 14 days (through 5/11, 5/12 respectively) Anti-infectives (From admission, onward)   Start     Dose/Rate Route Frequency Ordered Stop   11/07/18 1700  ceftazidime-avibactam (AVYCAZ) 2.5 g in dextrose 5 % 50 mL IVPB     2.5 g 25 mL/hr over 2 Hours Intravenous Every 8 hours 11/07/18 1608     11/01/18 1800  sulfamethoxazole-trimethoprim (BACTRIM) 386.08 mg in dextrose 5 % 500 mL IVPB     15 mg/kg/day  77.2 kg 349.4 mL/hr over 90 Minutes Intravenous Every 8 hours 11/01/18 1742 11/12/18  2359   10/31/18 0915  cefTAZidime (FORTAZ) 2 g in sodium chloride 0.9 % 100 mL IVPB  Status:  Discontinued     2 g 200 mL/hr over 30 Minutes Intravenous Every 8 hours 10/31/18 0851 11/07/18 1608   10/30/18 1345  ceFEPIme (MAXIPIME) 2 g in sodium chloride 0.9 % 100 mL IVPB  Status:  Discontinued     2 g 200 mL/hr over 30 Minutes Intravenous Every 8 hours 10/30/18 1328 10/31/18 0851   10/30/18 1330  meropenem (MERREM) 1 g in sodium chloride 0.9 % 100 mL IVPB  Status:  Discontinued     1 g 200 mL/hr over 30 Minutes Intravenous Every 8 hours 10/30/18 1315 10/30/18 1325   10/30/18 0915  sulfamethoxazole-trimethoprim (BACTRIM DS) 800-160 MG per tablet 2 tablet  Status:  Discontinued     2 tablet Per Tube Every 8 hours 10/30/18 0916 11/01/18 1742   10/29/18 2200  sulfamethoxazole-trimethoprim (BACTRIM DS) 800-160 MG per tablet 2 tablet  Status:  Discontinued     2 tablet Per Tube Every 12 hours 10/29/18 0945 10/30/18 0916   10/29/18 1000  sulfamethoxazole-trimethoprim (BACTRIM DS) 800-160 MG per tablet 1 tablet     1 tablet Oral  Once 10/29/18 0945 10/29/18 1034   10/28/18 2000  sulfamethoxazole-trimethoprim (BACTRIM) 400-80 MG per tablet 2 tablet  Status:  Discontinued     2 tablet Per Tube Every 12 hours 10/28/18 1434 10/29/18 0945   10/07/2018 2200  meropenem (MERREM) 1 g in sodium chloride 0.9 % 100 mL IVPB  Status:  Discontinued     1 g 200 mL/hr over 30 Minutes Intravenous Every 8 hours 10/30/2018 2032 10/28/18 1434       RENAL A:  Nil acute P:  monitro  ELECTROLYTES A:  Low mag P: replete   GASTROINTESTINAL A:   PEG tub  P:   TF via PEG  HEMATOLOGIC A:  Anemia critical illness   P:  - PRBC for hgb </= 6.9gm%    - exceptions are   -  if ACS susepcted/confirmed then transfuse for hgb </= 8.0gm%,  or    -  active bleeding with hemodynamic instability, then transfuse regardless of hemoglobin value   At at all times try to transfuse 1 unit prbc as possible with exception  of active hemorrhage     ENDOCRINE A:   At risk hyperglycemia   P:   ssi  MSK/DERM x   Suspect that at least a contributor to her persistent hypoxemia is progressive atelectasis.  Will change PCV (achieving tidal volumes in the 200s) to PRVC, tidal volume 400, continue PEEP 10 Start recruitment maneuvers backslash CT     Best practice:  Diet: TF via PEG tube. VAP protocol (if indicated): bundle ordered. DVT prophylaxis: Heparin SQ GI prophylaxis: Protonix Glucose control: SSI Mobility: BR Code Status: FULL Family Communication: Mother updated on 5/5, planning further discussions in conjunction with neurology regarding her overall prognosis.  They are considering DNR status, possibly even withdrawal of care, but were not ready to make that decision formal yet . Mom cried 11/08/2018 after hearing about lack of improvement, cavitary pneumonia and brain anoxia but said she still is praying                                               Disposition: ICU      ATTESTATION & SIGNATURE   The patient Kathy Buckley is critically ill with multiple organ systems failure and requires high complexity decision making for assessment and support, frequent evaluation and titration of therapies, application of advanced monitoring technologies and extensive interpretation of multiple databases.   Critical Care Time devoted to patient care services described in this note is  30  Minutes. This time reflects time of care of this signee Dr Kalman Shan. This critical care time does not reflect procedure time, or teaching time or supervisory time of PA/NP/Med student/Med Resident etc but could involve  care discussion time     Dr. Kalman Shan, M.D., [facility].C.P Pulmonary and Critical Care Medicine Staff Physician [facility] [place] [facility] Pager: [phone], If no answer or between  15:00h - 7:00h: call [phone]  11/08/2018 11:05 AM

## 2018-11-08 NOTE — Progress Notes (Signed)
Nutrition Follow-up RD working remotely.  DOCUMENTATION CODES:   Not applicable  INTERVENTION:   Continue TF via G-tube:  Osmolite 1.2 at 40 ml/h  Pro-stat 60 ml BID  Provides 1552 kcal, 113 gm protein, 787 ml free water daily  NUTRITION DIAGNOSIS:   Inadequate oral intake related to inability to eat as evidenced by NPO status.  Ongoing  GOAL:   Patient will meet greater than or equal to 90% of their needs  Met with TF  MONITOR:   TF tolerance, Labs, Skin, I & O's  ASSESSMENT:   28 yo female with PMH of anoxic brain injury in 2019, chronic trach/vent, G-tube, resident at [facility] who was admitted with hypoxia. COVID-19 negative.  Patient remains on chronic vent support.  MV: 7.6 L/min Temp (24hrs), Avg:97.8 F (36.6 C), Min:97.5 F (36.4 C), Max:98.1 F (36.7 C)   Labs and medications reviewed. Tolerating TF via G-tube.   Per RN documentation, patient has moderate generalized edema and moderate edema to all 4 extremities.   MRI brain on 5/5 showed chronic atrophy r/t anoxic brain injury. Poor prognosis noted. Family is deciding on goals of care.   Diet Order:   Diet Order            Diet NPO time specified  Diet effective now              EDUCATION NEEDS:   No education needs have been identified at this time  Skin:  Skin Assessment: Reviewed RN Assessment  Last BM:  5/7 (type 7)  Height:   Ht Readings from Last 1 Encounters:  10/09/2018 5' 5"  (1.651 m)    Weight:   Wt Readings from Last 1 Encounters:  11/08/18 79 kg    Ideal Body Weight:  56.8 kg  BMI:  Body mass index is 28.98 kg/m.  Estimated Nutritional Needs:   Kcal:  8676  Protein:  100-115 gm  Fluid:  1.6 L    Molli Barrows, RD, LDN, [place] Pager [phone] After Hours Pager [phone]

## 2018-11-09 ENCOUNTER — Inpatient Hospital Stay (HOSPITAL_COMMUNITY): Payer: Medicaid Other

## 2018-11-09 LAB — CBC
HCT: 28.3 % — ABNORMAL LOW (ref 36.0–46.0)
Hemoglobin: 8 g/dL — ABNORMAL LOW (ref 12.0–15.0)
MCH: 24.2 pg — ABNORMAL LOW (ref 26.0–34.0)
MCHC: 28.3 g/dL — ABNORMAL LOW (ref 30.0–36.0)
MCV: 85.8 fL (ref 80.0–100.0)
Platelets: 320 10*3/uL (ref 150–400)
RBC: 3.3 MIL/uL — ABNORMAL LOW (ref 3.87–5.11)
RDW: 26.8 % — ABNORMAL HIGH (ref 11.5–15.5)
WBC: 17.7 10*3/uL — ABNORMAL HIGH (ref 4.0–10.5)
nRBC: 1.1 % — ABNORMAL HIGH (ref 0.0–0.2)

## 2018-11-09 LAB — BASIC METABOLIC PANEL
Anion gap: 5 (ref 5–15)
BUN: 12 mg/dL (ref 6–20)
CO2: 48 mmol/L — ABNORMAL HIGH (ref 22–32)
Calcium: 9 mg/dL (ref 8.9–10.3)
Chloride: 77 mmol/L — ABNORMAL LOW (ref 98–111)
Creatinine, Ser: <0.3 mg/dL — ABNORMAL LOW (ref 0.44–1.00)
Glucose, Bld: 117 mg/dL — ABNORMAL HIGH (ref 70–99)
Potassium: 5.3 mmol/L — ABNORMAL HIGH (ref 3.5–5.1)
Sodium: 130 mmol/L — ABNORMAL LOW (ref 135–145)

## 2018-11-09 LAB — BLOOD GAS, ARTERIAL
Acid-Base Excess: 21.6 mmol/L — ABNORMAL HIGH (ref 0.0–2.0)
Bicarbonate: 49.6 mmol/L — ABNORMAL HIGH (ref 20.0–28.0)
Drawn by: 331001
FIO2: 100
MECHVT: 400 mL
O2 Saturation: 97.3 %
PEEP: 12 cmH2O
Patient temperature: 99.9
RATE: 20 resp/min
pCO2 arterial: 119 mmHg (ref 32.0–48.0)
pH, Arterial: 7.247 — ABNORMAL LOW (ref 7.350–7.450)
pO2, Arterial: 118 mmHg — ABNORMAL HIGH (ref 83.0–108.0)

## 2018-11-09 LAB — GLUCOSE, CAPILLARY
Glucose-Capillary: 137 mg/dL — ABNORMAL HIGH (ref 70–99)
Glucose-Capillary: 115 mg/dL — ABNORMAL HIGH (ref 70–99)
Glucose-Capillary: 125 mg/dL — ABNORMAL HIGH (ref 70–99)
Glucose-Capillary: 117 mg/dL — ABNORMAL HIGH (ref 70–99)
Glucose-Capillary: 138 mg/dL — ABNORMAL HIGH (ref 70–99)
Glucose-Capillary: 156 mg/dL — ABNORMAL HIGH (ref 70–99)

## 2018-11-09 LAB — PHOSPHORUS: Phosphorus: 3.4 mg/dL (ref 2.5–4.6)

## 2018-11-09 LAB — MAGNESIUM: Magnesium: 2 mg/dL (ref 1.7–2.4)

## 2018-11-09 MED ORDER — SODIUM ZIRCONIUM CYCLOSILICATE 5 G PO PACK
5.0000 g | PACK | Freq: Once | ORAL | Status: AC
  Administered 2018-11-09: 11:00:00 5 g via ORAL
  Filled 2018-11-09: qty 1

## 2018-11-09 MED ORDER — FUROSEMIDE 10 MG/ML IJ SOLN
40.0000 mg | Freq: Once | INTRAMUSCULAR | Status: AC
  Administered 2018-11-09: 40 mg via INTRAVENOUS
  Filled 2018-11-09: qty 4

## 2018-11-09 NOTE — Progress Notes (Signed)
RT arrived to patient room due to vent alarm of pressure regulation. I suctioned patient and obtained nothing. I proceeded to bag/lavage with nothing obtained. Pt is not breathing over, in no distress. RT continues to suction with nothing obtained. BBS coarse/diminished. Elink MD called and stated will order ABG and Chest XRAY.

## 2018-11-09 NOTE — Progress Notes (Signed)
NAME:  Kathy Buckley, MRN:  [id number], DOB:  08-06-1990, LOS: 15 ADMISSION DATE:  03-Nov-2018, CONSULTATION DATE: 11-03-2018 REFERRING MD: Dr. 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  Recently admitted for pneumonia and returning from [facility] for worsening vent needs.  She resides at [facility] with baseline GCS is 3.  She was recently admitted on 4/19 to [facility] with worsening oxygenation.  She was ruled out for COVID-19 at that time with a negative nasopharyngeal swab.  Pneumonia was presumed to be bacterial and she was treated with cefepime, Flagyl, and vancomycin. ( Fever and tachycardia, which were the reasons for her presentation) had resolved and she was transferred back to [facility] the following day on 4/20.  Tracheal aspirate taken while inpatient was positive for multidrug-resistant Achromobacter.  Repeat aspirate during this admit showed stenotrophomonas Course complicated by seizure episode on 4/26?  Myoclonus  Past Medical History   has a past medical history of Acute respiratory failure (HCC), Anoxic brain damage (HCC), Cardiac arrest (HCC), Central corneal ulcer, left eye, Idiopathic hypotension, Tachyarrhythmia, and Viral hepatitis.  Significant Hospital Events   4/19 admit to [facility] for PNA 4/20 Transfer to [facility] 4/23 Admit to [facility] for hypoxia 4/28 fever 101, PEEP increased to 10  Consults:  N/A  Procedures:  4/25 - tracheostomy change to 8 Portex. 4/25 - bronchoscopy shows small amount white secretions.  5/1 - Afebrile last 24 hours. She remains on PEEP of 10, FiO2 down to 60%. Urine output improved with Lasix, Na normal with free water at 400cc q4h. RN concerned about frequent desaturations but no mucus aspirated 5/2 - worsening hypoxemia. Now 80% fio2, peep 10. Not on pressors . Febrile and high wbc again + 5/6 - No significant changes this morning.  Her FiO2 has been  decreased to 0.85. She remains on pressure control ventilation, tidal volumes in the 200s 5/7 -  80% fio2, peep up at 10. RT says lungs very stiff . Worsenign cavitary lung disease on left  . Mom informed   Significant Diagnostic Tests:  EEG 4/28 >> neg  Micro Data:  4/19 trach aspirate >> MDR Achromobacter.   4/19 - COVID negative 4/24- covid negative 4/25 tracheal aspirate - >100,000 Stenotrophomonas, 40 k pseudomonas  Antimicrobials:  Cefepime 4/19 > 4/23 Flagyl 4/19 > 4/23 Vancomycin 4/19 > 4/23 Meropenem 4/23 >>>4/26 Bactrim 4/26 >> ceftax 4/29 >>  Interim history/subjective:    11/09/2018 - 90% fio2, 10 peep. Constipated. ABd distended but tolerating tube feeds/. Unresponsive . K going up - bactrim suspected per pharmacy  Objective   Blood pressure 113/74, pulse (!) 115, temperature 99.5 F (37.5 C), temperature source Oral, resp. rate 16, height  (1.651 m), weight 78.2 kg, SpO2 93 %.    Vent Mode: PRVC FiO2 (%):  [80 %-100 %] 90 % Set Rate:  [20 bmp] 20 bmp Vt Set:  [400 mL] 400 mL PEEP:  [10 cmH20] 10 cmH20 Plateau Pressure:  [39 cmH20-49 cmH20] 43 cmH20   Intake/Output Summary (Last 24 hours) at 11/09/2018 1002 Last data filed at 11/09/2018 0800 Gross per 24 hour  Intake 2405.64 ml  Output 2000 ml  Net 405.64 ml   Filed Weights   11/07/18 0500 11/08/18 0500 11/09/18 0328  Weight: 78.6 kg 79 kg 78.2 kg   General Appearance:  Looks criticall ill OBESE - + Head:  Normocephalic, without obvious abnormality, atraumatic Eyes:  PERRL -  no per RN, conjunctiva/corneas - muddy, scleral edema     Ears:  Normal external ear canals, both ears Nose:  G tube - no Throat:  ETT TUBE - no , OG tube - no. TRACH +.   Neck:  Supple,  No enlargement/tenderness/nodules Lungs: Clear to auscultation bilaterally, Ventilator   Synchrony - yes Heart:  S1 and S2 normal, no murmur, CVP - n.  Pressors - no Abdomen:  Soft, no masses, no organomegaly. DISTENDED. +  PEG + Genitalia /  Rectal:  Not done Extremities:  Extremities- intact Skin:  ntact in exposed areas . Sacral area - not examined Neurologic:  Sedation - none -> RASS - -4         LABS    PULMONARY Recent Labs  Lab 11/02/18 1126  PHART 7.433  PCO2ART 57.3*  PO2ART 59.0*  HCO3 38.3*  TCO2 40*  O2SAT 90.0    CBC Recent Labs  Lab 11/07/18 0406 11/08/18 0459 11/09/18 0318  HGB 8.2* 7.7* 8.0*  HCT 30.2* 28.4* 28.3*  WBC 18.0* 19.5* 17.7*  PLT 276 278 320    COAGULATION Recent Labs  Lab 11/03/18 0447  INR 1.4*    CARDIAC  No results for input(s): TROPONINI in the last 168 hours. No results for input(s): PROBNP in the last 168 hours.   CHEMISTRY Recent Labs  Lab 11/05/18 0533 11/06/18 0432 11/06/18 1803 11/07/18 0406 11/08/18 0459 11/09/18 0318  NA  --  133* 134* 135 129* 130*  K  --  4.4 4.3 3.7 4.5 5.3*  CL  --  83* 81* 77* 76* 77*  CO2  --  41* 49* 46* 48* 48*  GLUCOSE  --  111* 93 122* 192* 117*  BUN  --  12 12 14 10 12   CREATININE  --  0.39* 0.42* 0.38* 0.30* <0.30*  CALCIUM  --  8.8* 8.7* 8.6* 9.0 9.0  MG 1.6* 1.9  --  1.6* 1.5* 2.0  PHOS 1.8* 2.8  --  2.4* 2.6 3.4   CrCl cannot be calculated (This lab value cannot be used to calculate CrCl because it is not a number: <0.30).   LIVER Recent Labs  Lab 11/03/18 0447  AST 31  ALT 20  ALKPHOS 133*  BILITOT 0.2*  PROT 9.2*  ALBUMIN 1.4*  INR 1.4*     INFECTIOUS Recent Labs  Lab 11/03/18 0039  LATICACIDVEN 1.4     ENDOCRINE CBG (last 3)  Recent Labs    11/08/18 2313 11/09/18 0337 11/09/18 0732  GLUCAP 146* 137* 115*         IMAGING x48h  - image(s) personally visualized  -   highlighted in bold Dg Chest Port 1 View  Result Date: 11/08/2018 CLINICAL DATA:  Acute respiratory failure EXAM: PORTABLE CHEST 1 VIEW COMPARISON:  Four days ago FINDINGS: Tracheostomy tube in place. Left upper extremity PICC with tip at the upper cavoatrial junction. Bilateral airspace disease with  reticulonodular appearance. A cavitary lesion is seen the lower left chest. Dextrocardia. Probable pleural fluid on both sides. No pneumothorax. IMPRESSION: Bilateral pneumonia with cavitary features on the left. Electronically Signed   By: Marnee SpringJonathon  Watts M.D.   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  Plan for 14 days (through 5/11, 5/12 respectively) Anti-infectives (From admission, onward)   Start     Dose/Rate Route Frequency Ordered Stop   11/07/18 1700  ceftazidime-avibactam (AVYCAZ) 2.5 g in dextrose 5 % 50 mL IVPB     2.5 g 25 mL/hr over 2 Hours Intravenous Every 8 hours 11/07/18 1608     11/01/18 1800  sulfamethoxazole-trimethoprim (BACTRIM) 386.08 mg in dextrose 5 % 500 mL IVPB     15 mg/kg/day  77.2 kg 349.4 mL/hr over 90 Minutes Intravenous Every 8 hours 11/01/18 1742 11/12/18 2359   10/31/18 0915  cefTAZidime (FORTAZ) 2 g in sodium chloride 0.9 % 100 mL IVPB  Status:  Discontinued     2 g 200 mL/hr over 30 Minutes Intravenous Every 8 hours 10/31/18 0851 11/07/18 1608   10/30/18 1345  ceFEPIme (MAXIPIME) 2 g in sodium chloride 0.9 % 100 mL IVPB  Status:  Discontinued     2 g 200 mL/hr over 30 Minutes Intravenous Every 8 hours 10/30/18 1328 10/31/18 0851   10/30/18 1330  meropenem (MERREM) 1 g in sodium chloride 0.9 %  100 mL IVPB  Status:  Discontinued     1 g 200 mL/hr over 30 Minutes Intravenous Every 8 hours 10/30/18 1315 10/30/18 1325   10/30/18 0915  sulfamethoxazole-trimethoprim (BACTRIM DS) 800-160 MG per tablet 2 tablet  Status:  Discontinued     2 tablet Per Tube Every 8 hours 10/30/18 0916 11/01/18 1742   10/29/18 2200  sulfamethoxazole-trimethoprim (BACTRIM DS) 800-160 MG per tablet 2 tablet  Status:  Discontinued     2 tablet Per Tube Every 12 hours 10/29/18 0945 10/30/18 0916   10/29/18 1000  sulfamethoxazole-trimethoprim (BACTRIM DS) 800-160 MG per tablet 1 tablet     1 tablet Oral  Once 10/29/18 0945 10/29/18 1034   10/28/18 2000  sulfamethoxazole-trimethoprim (BACTRIM) 400-80 MG per tablet 2 tablet  Status:  Discontinued     2 tablet Per Tube Every 12 hours 10/28/18 1434 10/29/18 0945   2018-11-01 2200  meropenem (MERREM) 1 g in sodium chloride 0.9 % 100 mL IVPB  Status:  Discontinued     1 g 200 mL/hr over 30 Minutes Intravenous Every 8 hours 2018-11-01 2032 10/28/18 1434       RENAL A:  Nil acute P:  monitro  ELECTROLYTES A:  High k P: lokelma Lasix x 1   GASTROINTESTINAL A:   PEG tub  P:   TF via PEG  HEMATOLOGIC A:  Anemia critical illness   P:  - PRBC for hgb </= 6.9gm%    - exceptions are   -  if ACS susepcted/confirmed then transfuse for hgb </= 8.0gm%,  or    -  active bleeding with hemodynamic instability, then transfuse regardless of hemoglobin value   At at all times try to transfuse 1 unit prbc as possible with exception of active hemorrhage     ENDOCRINE A:   At risk hyperglycemia   P:   ssi  MSK/DERM x  Best practice:  Diet: TF via PEG tube. VAP protocol (if indicated): bundle ordered. DVT prophylaxis: Heparin SQ GI prophylaxis: Protonix Glucose control: SSI Mobility: BR Code Status: FULL Family Communication: Mother updated on 5/5, planning further discussions in conjunction with neurology regarding her overall prognosis.  They  are considering DNR status, possibly even withdrawal of care, but were not ready to make that decision formal yet . Mom cried 11/08/2018 after hearing about lack of improvement, cavitary pneumonia and brain anoxia but said she still is praying                                               Disposition: ICU      ATTESTATION & SIGNATURE   The patient Kathy Buckley is critically ill with multiple organ systems failure and requires high complexity decision making for assessment and support, frequent evaluation and titration of therapies, application of advanced monitoring technologies and extensive interpretation of multiple databases.   Critical Care Time devoted to patient care services described in this note is  30  Minutes. This time reflects time of care of this signee Dr Kalman Shan. This critical care time does not reflect procedure time, or teaching time or supervisory time of PA/NP/Med student/Med Resident etc but could involve care discussion time     Dr. Kalman Shan, M.D., [facility].C.P Pulmonary and Critical Care Medicine Staff Physician [facility] [place] [facility] [place] [facility] and Critical [facility] Pager: [phone], If no answer or between  15:00h - 7:00h: call [phone]  11/09/2018 10:02 AM

## 2018-11-10 ENCOUNTER — Encounter (HOSPITAL_COMMUNITY): Payer: Self-pay | Admitting: *Deleted

## 2018-11-10 DIAGNOSIS — J9622 Acute and chronic respiratory failure with hypercapnia: Secondary | ICD-10-CM

## 2018-11-10 LAB — BLOOD GAS, ARTERIAL
Acid-Base Excess: 25.4 mmol/L — ABNORMAL HIGH (ref 0.0–2.0)
Bicarbonate: 52.4 mmol/L — ABNORMAL HIGH (ref 20.0–28.0)
Drawn by: 331001
FIO2: 100
MECHVT: 400 mL
O2 Saturation: 98.6 %
PEEP: 14 cmH2O
Patient temperature: 99
RATE: 26 resp/min
pCO2 arterial: 93.6 mmHg (ref 32.0–48.0)
pH, Arterial: 7.368 (ref 7.350–7.450)
pO2, Arterial: 139 mmHg — ABNORMAL HIGH (ref 83.0–108.0)

## 2018-11-10 LAB — BASIC METABOLIC PANEL
Anion gap: 6 (ref 5–15)
BUN: 12 mg/dL (ref 6–20)
CO2: 48 mmol/L — ABNORMAL HIGH (ref 22–32)
Calcium: 8.9 mg/dL (ref 8.9–10.3)
Chloride: 79 mmol/L — ABNORMAL LOW (ref 98–111)
Creatinine, Ser: <0.3 mg/dL — ABNORMAL LOW (ref 0.44–1.00)
Glucose, Bld: 84 mg/dL (ref 70–99)
Potassium: 4.6 mmol/L (ref 3.5–5.1)
Sodium: 133 mmol/L — ABNORMAL LOW (ref 135–145)

## 2018-11-10 LAB — GLUCOSE, CAPILLARY
Glucose-Capillary: 105 mg/dL — ABNORMAL HIGH (ref 70–99)
Glucose-Capillary: 137 mg/dL — ABNORMAL HIGH (ref 70–99)
Glucose-Capillary: 134 mg/dL — ABNORMAL HIGH (ref 70–99)
Glucose-Capillary: 111 mg/dL — ABNORMAL HIGH (ref 70–99)
Glucose-Capillary: 107 mg/dL — ABNORMAL HIGH (ref 70–99)

## 2018-11-10 MED ORDER — SODIUM CHLORIDE 3 % IN NEBU
4.0000 mL | INHALATION_SOLUTION | Freq: Every day | RESPIRATORY_TRACT | Status: DC
  Filled 2018-11-10: qty 4

## 2018-11-10 MED ORDER — SODIUM CHLORIDE 3 % IN NEBU
4.0000 mL | INHALATION_SOLUTION | Freq: Once | RESPIRATORY_TRACT | Status: AC
  Administered 2018-11-10: 4 mL via RESPIRATORY_TRACT
  Filled 2018-11-10: qty 4

## 2018-11-10 MED ORDER — IPRATROPIUM-ALBUTEROL 0.5-2.5 (3) MG/3ML IN SOLN
3.0000 mL | Freq: Four times a day (QID) | RESPIRATORY_TRACT | Status: AC
  Administered 2018-11-10 (×4): 3 mL via RESPIRATORY_TRACT
  Filled 2018-11-10 (×4): qty 3

## 2018-11-10 NOTE — Progress Notes (Signed)
NAME:  Kathy Buckley, MRN:  [id number], DOB:  [birth year], LOS: 16 ADMISSION DATE:  10/09/2018, CONSULTATION DATE: October 25, 2018 REFERRING MD: Dr. 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  Recently admitted for pneumonia and returning from [facility] for worsening vent needs.  She resides at [facility] with baseline GCS is 3.  She was recently admitted on 4/19 to [facility] with worsening oxygenation.  She was ruled out for COVID-19 at that time with a negative nasopharyngeal swab.  Pneumonia was presumed to be bacterial and she was treated with cefepime, Flagyl, and vancomycin. ( Fever and tachycardia, which were the reasons for her presentation) had resolved and she was transferred back to [facility] the following day on 4/20.  Tracheal aspirate taken while inpatient was positive for multidrug-resistant Achromobacter.  Repeat aspirate during this admit showed stenotrophomonas Course complicated by seizure episode on 4/26?  Myoclonus  Past Medical History   has a past medical history of Acute respiratory failure (HCC), Anoxic brain damage (HCC), Cardiac arrest (HCC), Central corneal ulcer, left eye, Idiopathic hypotension, Tachyarrhythmia, and Viral hepatitis.  Significant Hospital Events   4/19 admit to [facility] for PNA 4/20 Transfer to [facility] 4/23 Admit to [facility] for hypoxia 4/28 fever 101, PEEP increased to 10  Consults:  N/A  Procedures:  4/25 - tracheostomy change to 8 Portex. 4/25 - bronchoscopy shows small amount white secretions.  5/1 - Afebrile last 24 hours. She remains on PEEP of 10, FiO2 down to 60%. Urine output improved with Lasix, Na normal with free water at 400cc q4h. RN concerned about frequent desaturations but no mucus aspirated 5/2 - worsening hypoxemia. Now 80% fio2, peep 10. Not on pressors . Febrile and high wbc again + 5/6 - No significant changes this morning.  Her FiO2 has been  decreased to 0.85. She remains on pressure control ventilation, tidal volumes in the 200s 5/7 -  80% fio2, peep up at 10. RT says lungs very stiff . Worsenign cavitary lung disease on left  . Mom informed  90% fio2, 10 peep. Constipated. ABd distended but tolerating tube feeds/. Unresponsive . K going up - bactrim suspected per pharmacy  Significant Diagnostic Tests:  EEG 4/28 >> neg  Micro Data:  4/19 trach aspirate >> MDR Achromobacter.   4/19 - COVID negative 4/24- covid negative 4/25 tracheal aspirate - >100,000 Stenotrophomonas, 40 k pseudomonas  Antimicrobials:  Cefepime 4/19 > 4/23 Flagyl 4/19 > 4/23 Vancomycin 4/19 > 4/23 Meropenem 4/23 >>>4/26 Bactrim 4/26 >> ceftax 4/29 >>  Interim history/subjective:    11/10/2018 -overnight higher PiP . On 90% fio2 and peep 14. Wosening and mulitple cavitations on lung  Objective   Blood pressure 118/76, pulse (!) 114, temperature (!) 97.4 F (36.3 C), temperature source Axillary, resp. rate (!) 26, height  (1.651 m), weight 78.2 kg, SpO2 95 %.    Vent Mode: PRVC FiO2 (%):  [80 %-100 %] 90 % Set Rate:  [20 bmp-26 bmp] 26 bmp Vt Set:  [40 mL-400 mL] 400 mL PEEP:  [10 cmH20-14 cmH20] 14 cmH20 Plateau Pressure:  [36 cmH20-52 cmH20] 49 cmH20   Intake/Output Summary (Last 24 hours) at 11/10/2018 1046 Last data filed at 11/10/2018 1000 Gross per 24 hour  Intake 3021.17 ml  Output 2400 ml  Net 621.17 ml   Filed Weights   11/08/18 0500 11/09/18 0328 11/10/18 0418  Weight: 79 kg 78.2 kg 78.2 kg  General Appearance:  Looks criticall ill OBESE - + Head:  Normocephalic, without obvious abnormality, atraumatic Eyes:  PERRL - non reactive, conjunctiva/corneas - muddy with scleral ededma     Ears:  Normal external ear canals, both ears Nose:  G tube - no Throat:  ETT TUBE - no , OG tube - no. TRACH + Neck:  Supple,  No enlargement/tenderness/nodules Lungs: Clear to auscultation bilaterally, Ventilator   Synchrony - yes Heart:   S1 and S2 normal, no murmur, CVP - no.  Pressors - no Abdomen:  Soft, no masses, no organomegaly. PEG + Genitalia / Rectal:  Not done Extremities:  Extremities- intact Skin:  ntact in exposed areas . Sacral area - none Neurologic:  Sedation - none -> RASS - -4/-5           LABS    PULMONARY Recent Labs  Lab 11/09/18 2220 11/10/18 0158  PHART 7.247* 7.368  PCO2ART 119* 93.6*  PO2ART 118* 139*  HCO3 49.6* 52.4*  O2SAT 97.3 98.6    CBC Recent Labs  Lab 11/07/18 0406 11/08/18 0459 11/09/18 0318  HGB 8.2* 7.7* 8.0*  HCT 30.2* 28.4* 28.3*  WBC 18.0* 19.5* 17.7*  PLT 276 278 320    COAGULATION No results for input(s): INR in the last 168 hours.  CARDIAC  No results for input(s): TROPONINI in the last 168 hours. No results for input(s): PROBNP in the last 168 hours.   CHEMISTRY Recent Labs  Lab 11/05/18 0533 11/06/18 0432 11/06/18 1803 11/07/18 0406 11/08/18 0459 11/09/18 0318 11/10/18 0228  NA  --  133* 134* 135 129* 130* 133*  K  --  4.4 4.3 3.7 4.5 5.3* 4.6  CL  --  83* 81* 77* 76* 77* 79*  CO2  --  41* 49* 46* 48* 48* 48*  GLUCOSE  --  111* 93 122* 192* 117* 84  BUN  --  12 12 14 10 12 12   CREATININE  --  0.39* 0.42* 0.38* 0.30* <0.30* <0.30*  CALCIUM  --  8.8* 8.7* 8.6* 9.0 9.0 8.9  MG 1.6* 1.9  --  1.6* 1.5* 2.0  --   PHOS 1.8* 2.8  --  2.4* 2.6 3.4  --    CrCl cannot be calculated (This lab value cannot be used to calculate CrCl because it is not a number: <0.30).   LIVER No results for input(s): AST, ALT, ALKPHOS, BILITOT, PROT, ALBUMIN, INR in the last 168 hours.   INFECTIOUS No results for input(s): LATICACIDVEN, PROCALCITON in the last 168 hours.   ENDOCRINE CBG (last 3)  Recent Labs    11/09/18 2317 11/10/18 0332 11/10/18 0724  GLUCAP 156* 105* 137*         IMAGING x48h  - image(s) personally visualized  -   highlighted in bold Dg Chest Port 1 View  Result Date: 11/09/2018 CLINICAL DATA:  Respiratory distress  EXAM: PORTABLE CHEST 1 VIEW COMPARISON:  11/08/2018 FINDINGS: Cardiac shadows within normal limits. Patchy infiltrates are noted bilaterally left greater than right but slightly improved when compared with the prior exam. Cavitary lesion remains in the left base. Tracheostomy tube is noted in satisfactory position. No bony abnormality is noted. IMPRESSION: Bilateral infiltrates somewhat improved when compared with the prior exam with cavitary changes on the left. Electronically Signed   By: Alcide CleverMark  Lukens M.D.   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  Plan for 14 days (through 5/11, 5/12 respectively) Mom is asking for ID consult ? Change antibipotics Anti-infectives (From admission, onward)   Start     Dose/Rate Route Frequency Ordered Stop   11/07/18 1700  ceftazidime-avibactam (AVYCAZ) 2.5 g in dextrose 5 % 50 mL IVPB     2.5 g 25 mL/hr over 2 Hours Intravenous Every 8 hours 11/07/18 1608     11/01/18 1800  sulfamethoxazole-trimethoprim (BACTRIM) 386.08 mg in dextrose 5 % 500 mL IVPB     15 mg/kg/day  77.2 kg 349.4 mL/hr over 90 Minutes Intravenous Every 8 hours 11/01/18 1742 11/12/18 2359   10/31/18 0915  cefTAZidime (FORTAZ) 2 g in sodium chloride 0.9 % 100 mL IVPB  Status:  Discontinued      2 g 200 mL/hr over 30 Minutes Intravenous Every 8 hours 10/31/18 0851 11/07/18 1608   10/30/18 1345  ceFEPIme (MAXIPIME) 2 g in sodium chloride 0.9 % 100 mL IVPB  Status:  Discontinued     2 g 200 mL/hr over 30 Minutes Intravenous Every 8 hours 10/30/18 1328 10/31/18 0851   10/30/18 1330  meropenem (MERREM) 1 g in sodium chloride 0.9 % 100 mL IVPB  Status:  Discontinued     1 g 200 mL/hr over 30 Minutes Intravenous Every 8 hours 10/30/18 1315 10/30/18 1325   10/30/18 0915  sulfamethoxazole-trimethoprim (BACTRIM DS) 800-160 MG per tablet 2 tablet  Status:  Discontinued     2 tablet Per Tube Every 8 hours 10/30/18 0916 11/01/18 1742   10/29/18 2200  sulfamethoxazole-trimethoprim (BACTRIM DS) 800-160 MG per tablet 2 tablet  Status:  Discontinued     2 tablet Per Tube Every 12 hours 10/29/18 0945 10/30/18 0916   10/29/18 1000  sulfamethoxazole-trimethoprim (BACTRIM DS) 800-160 MG per tablet 1 tablet     1 tablet Oral  Once 10/29/18 0945 10/29/18 1034   10/28/18 2000  sulfamethoxazole-trimethoprim (BACTRIM) 400-80 MG per tablet 2 tablet  Status:  Discontinued     2 tablet Per Tube Every 12 hours 10/28/18 1434 10/29/18 0945   11/16/2018 2200  meropenem (MERREM) 1 g in sodium chloride 0.9 % 100 mL IVPB  Status:  Discontinued     1 g 200 mL/hr over 30 Minutes Intravenous Every 8 hours Nov 16, 2018 2032 10/28/18 1434       RENAL A:  Nil acute P:  monitro  ELECTROLYTES A:  High k P: lokelma Lasix x 1   GASTROINTESTINAL A:   PEG tub  P:   TF via PEG  HEMATOLOGIC A:  Anemia critical illness   P:  - PRBC for hgb </= 6.9gm%    - exceptions are   -  if ACS susepcted/confirmed then transfuse for hgb </= 8.0gm%,  or    -  active bleeding with hemodynamic instability,  then transfuse regardless of hemoglobin value   At at all times try to transfuse 1 unit prbc as possible with exception of active hemorrhage     ENDOCRINE A:   At risk hyperglycemia   P:    ssi  MSK/DERM x    Best practice:  Diet: TF via PEG tube.  VAP protocol (if indicated): bundle ordered.  DVT prophylaxis: Heparin SQ  GI prophylaxis: Protonix  Glucose control: SSI  Mobility: BR  Code Status: FULL  Family Communication: Mother updated on 5/5, planning further discussions in conjunction with neurology regarding her overall prognosis.  They are considering DNR status, possibly even withdrawal of care, but were not ready to make that decision formal yet . Mom cried 11/08/2018 after hearing about lack of improvement, cavitary pneumonia and brain anoxia but said she still is praying/. Called to give update on 11/10/2018 -> informed her ARDS with cavitation worse . Explained to mom again patient has MDR infection . She is frustrated. Will call ID Specialist -> ? Another antibiotics warranted  Per mom                                         Disposition: ICU      ATTESTATION & SIGNATURE   The patient Kathy Buckley is critically ill with multiple organ systems failure and requires high complexity decision making for assessment and support, frequent evaluation and titration of therapies, application of advanced monitoring technologies and extensive interpretation of multiple databases.   Critical Care Time devoted to patient care services described in this note is  30  Minutes. This time reflects time of care of this signee Dr Kalman Shan. This critical care time does not reflect procedure time, or teaching time or supervisory time of PA/NP/Med student/Med Resident etc but could involve care discussion time     Dr. Kalman Shan, M.D., [facility].C.P Pulmonary and Critical Care Medicine Staff Physician [place] [facility] [place] Pulmonary and Critical Care Pager: [phone], If no answer or between  15:00h - 7:00h: call [phone]  11/10/2018 10:46 AM

## 2018-11-10 NOTE — Progress Notes (Signed)
ID PROGRESS NOTE  27yoF with MDRO VAP. Has ESBL kleb, MDR pseudomonas plus stenotrophomonas. Currently on ceftazidime-avibactam plus bactrim.  Her WBC appears improved, she is afebrile. CXR yesterday still showing cavitary lesions which anticipate would take time to improve.   Before empirically changing which is limited in choice of abtx  - recommend to repeat bronch to get new specimen to see if abtx are still susceptible. - for now, continue the course. - will check in on tomorrow  Davis Vannatter B. Drue Second MD MPH [facility] [phone]

## 2018-11-10 NOTE — Progress Notes (Signed)
Verbal order per Dr. Marchelle Gearing to D/C metaneb due to frequent pt desaturations. New verbal order to continue CPT thru the bed therapy.

## 2018-11-10 NOTE — Plan of Care (Addendum)
PCCM Plan of Care Note Evaluated patient at bedside for frequent desaturation and acute on chronic hypercapnic respiratory failure  28 year old female requiring chronic vent secondary to anoxic injury in 2019.  Admitted to the MICU for acute on chronic respiratory failure due to Pneumonia.  Tonight patient noted to have frequent desaturations.  Suctioned multiple times with no significant secretions.  FiO2 had to be increased to 100%.  ABG obtained and showed acute on chronic hypercapnia with pH of 7.24 and PCO2 of 119.  PO2 is 118 100% FiO2.  Peak airway pressures are elevated in the mid 50s.  Chest x-ray obtained and appears unchanged from previous chest x-rays.  Does show right lower lobe collapse with mediastinal shift to the right and tenting of the right diaphragm.  Plateau pressures checked and elevated to 35.  Decreased air movement in bilateral mid to lower lung fields  BP 109/67   Pulse (!) 117   Temp 99 F (37.2 C) (Axillary)   Resp 14   Ht 5\' 5"  (1.651 m)   Wt 78.2 kg   SpO2 94%   BMI 28.69 kg/m    Patient with increasing ventilator requirements likely due to poor lung compliance due to pneumonia.  She is currently on antibiotics for that.  I suspect that a component of her elevated peak airway  pressure is due to obstruction of the right lower lobe.  I reviewed her chest x-rays from multiple admissions and it seems like the right lower lobe has been down for a while.  She had a bronchoscopy done on April 25 with scanty secretions. -Increase PEEP to 14 -Respiratory rate increased to 26 -Hypertonic saline nebs -duo nebs once -Put right lung up for postural drainage though doubt that she will have any benefit given duration of collapse and minimal secretions on recent bronchoscopy -aggressive chest physiotherapy to include chest percussion and Meta nebs -Repeat ABG in an hour  Critical Care time: 78 Minutes  Larna Daughters, MD PCCM

## 2018-11-11 LAB — GLUCOSE, CAPILLARY
Glucose-Capillary: 103 mg/dL — ABNORMAL HIGH (ref 70–99)
Glucose-Capillary: 103 mg/dL — ABNORMAL HIGH (ref 70–99)
Glucose-Capillary: 109 mg/dL — ABNORMAL HIGH (ref 70–99)
Glucose-Capillary: 117 mg/dL — ABNORMAL HIGH (ref 70–99)
Glucose-Capillary: 122 mg/dL — ABNORMAL HIGH (ref 70–99)
Glucose-Capillary: 123 mg/dL — ABNORMAL HIGH (ref 70–99)
Glucose-Capillary: 157 mg/dL — ABNORMAL HIGH (ref 70–99)

## 2018-11-11 MED ORDER — SODIUM CHLORIDE 0.9 % IV SOLN
INTRAVENOUS | Status: DC | PRN
  Administered 2018-11-12 – 2018-11-17 (×3): 250 mL via INTRAVENOUS
  Administered 2018-11-18: 500 mL via INTRAVENOUS

## 2018-11-11 NOTE — Progress Notes (Signed)
NAME:  Kathy Buckley, MRN:  [id number], DOB:  22-May-1991, LOS: 17 ADMISSION DATE:  10/21/2018, CONSULTATION DATE: October 25, 2018 REFERRING MD: Dr. 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  Recently admitted for pneumonia and returning from [facility] for worsening vent needs.  She resides at [facility] with baseline GCS is 3.  She was recently admitted on 4/19 to [facility] with worsening oxygenation.  She was ruled out for COVID-19 at that time with a negative nasopharyngeal swab.  Pneumonia was presumed to be bacterial and she was treated with cefepime, Flagyl, and vancomycin. ( Fever and tachycardia, which were the reasons for her presentation) had resolved and she was transferred back to [facility] the following day on 4/20.  Tracheal aspirate taken while inpatient was positive for multidrug-resistant Achromobacter.  Repeat aspirate during this admit showed stenotrophomonas Course complicated by seizure episode on 4/26?  Myoclonus  Past Medical History   has a past medical history of Acute respiratory failure (HCC), Anoxic brain damage (HCC), Cardiac arrest (HCC), Central corneal ulcer, left eye, Idiopathic hypotension, Tachyarrhythmia, and Viral hepatitis.  Significant Hospital Events   4/19 admit to [facility] for PNA 4/20 Transfer to [facility] 4/23 Admit to [facility] for hypoxia 4/28 fever 101, PEEP increased to 10  Consults:  N/A  Procedures:  4/25 - tracheostomy change to 8 Portex. 4/25 - bronchoscopy shows small amount white secretions.  5/1 - Afebrile last 24 hours. She remains on PEEP of 10, FiO2 down to 60%. Urine output improved with Lasix, Na normal with free water at 400cc q4h. RN concerned about frequent desaturations but no mucus aspirated 5/2 - worsening hypoxemia. Now 80% fio2, peep 10. Not on pressors . Febrile and high wbc again + 5/6 - No significant changes this morning.  Her FiO2 has been  decreased to 0.85. She remains on pressure control ventilation, tidal volumes in the 200s 5/7 -  80% fio2, peep up at 10. RT says lungs very stiff . Worsenign cavitary lung disease on left  . Mom informed  90% fio2, 10 peep. Constipated. ABd distended but tolerating tube feeds/. Unresponsive . K going up - bactrim suspected per pharmacy  5/9 - overnight higher PiP . On 90% fio2 and peep 14. Wosening and mulitple cavitations on lung  Significant Diagnostic Tests:  EEG 4/28 >> neg  Micro Data:  4/19 trach aspirate >> MDR Achromobacter.   4/19 - COVID negative 4/24- covid negative 4/25 tracheal aspirate - >100,000 Stenotrophomonas, 40 k pseudomonas 5/10 - trach aspirate  Antimicrobials:  Cefepime 4/19 > 4/23 Flagyl 4/19 > 4/23 Vancomycin 4/19 > 4/23 Meropenem 4/23 >>>4/26 ....................... Bactrim 4/26 >> ceftax 4/29 >>  Interim history/subjective:    11/11/2018 - 70% fio2, peep 14. No bedsores per RN. Not on pressors. Not on sedation-. Comatose.  ID wants patiuent bronch before considering antibiotic change  Objective   Blood pressure 111/73, pulse (!) 118, temperature (!) 100.6 F (38.1 C), temperature source Oral, resp. rate (!) 26, height  (1.651 m), weight 78.1 kg, SpO2 93 %.    Vent Mode: PRVC FiO2 (%):  [70 %-80 %] 70 % Set Rate:  [26 bmp] 26 bmp Vt Set:  [400 mL] 400 mL PEEP:  [14 cmH20] 14 cmH20 Plateau Pressure:  [44 cmH20-53 cmH20] 44 cmH20   Intake/Output Summary (Last 24 hours) at 11/11/2018 1153 Last data filed at 11/11/2018 1100 Gross per 24 hour  Intake 2797.62  ml  Output 1650 ml  Net 1147.62 ml   Filed Weights   11/09/18 0328 11/10/18 0418 11/11/18 0348  Weight: 78.2 kg 78.2 kg 78.1 kg    General Appearance:  Looks criticall ill OBESE - + Head:  Normocephalic, without obvious abnormality, atraumatic Eyes:  PERRL - no, conjunctiva/corneas - edema     Ears:  Normal external ear canals, both ears Nose:  G tube - no Throat:  ETT TUBE - no ,  OG tube - no. TRACH +,  Neck:  Supple,  No enlargement/tenderness/nodules Lungs: Clear to auscultation bilaterally, Ventilator   Synchrony - yes Heart:  S1 and S2 normal, no murmur, CVP - no.  Pressors - no Abdomen:  Soft, no masses, no organomegaly. PEG + Genitalia / Rectal:  Not done Extremities:  Extremities- intact Skin:  ntact in exposed areas . Sacral area - x Neurologic:  Sedation - none -> RASS - -5    LABS    PULMONARY Recent Labs  Lab 11/09/18 2220 11/10/18 0158  PHART 7.247* 7.368  PCO2ART 119* 93.6*  PO2ART 118* 139*  HCO3 49.6* 52.4*  O2SAT 97.3 98.6    CBC Recent Labs  Lab 11/07/18 0406 11/08/18 0459 11/09/18 0318  HGB 8.2* 7.7* 8.0*  HCT 30.2* 28.4* 28.3*  WBC 18.0* 19.5* 17.7*  PLT 276 278 320    COAGULATION No results for input(s): INR in the last 168 hours.  CARDIAC  No results for input(s): TROPONINI in the last 168 hours. No results for input(s): PROBNP in the last 168 hours.   CHEMISTRY Recent Labs  Lab 11/05/18 0533 11/06/18 0432 11/06/18 1803 11/07/18 0406 11/08/18 0459 11/09/18 0318 11/10/18 0228  NA  --  133* 134* 135 129* 130* 133*  K  --  4.4 4.3 3.7 4.5 5.3* 4.6  CL  --  83* 81* 77* 76* 77* 79*  CO2  --  41* 49* 46* 48* 48* 48*  GLUCOSE  --  111* 93 122* 192* 117* 84  BUN  --  CREATININE  --  0.39* 0.42* 0.38* 0.30* <0.30* <0.30*  CALCIUM  --  8.8* 8.7* 8.6* 9.0 9.0 8.9  MG 1.6* 1.9  --  1.6* 1.5* 2.0  --   PHOS 1.8* 2.8  --  2.4* 2.6 3.4  --    CrCl cannot be calculated (This lab value cannot be used to calculate CrCl because it is not a number: <0.30).   LIVER No results for input(s): AST, ALT, ALKPHOS, BILITOT, PROT, ALBUMIN, INR in the last 168 hours.   INFECTIOUS No results for input(s): LATICACIDVEN, PROCALCITON in the last 168 hours.   ENDOCRINE CBG (last 3)  Recent Labs    11/11/18 0353 11/11/18 0717 11/11/18 1116  GLUCAP 109* 103* 123*         IMAGING x48h  - image(s)  personally visualized  -   highlighted in bold Dg Chest Port 1 View  Result Date: 11/09/2018 CLINICAL DATA:  Respiratory distress EXAM: PORTABLE CHEST 1 VIEW COMPARISON:  11/08/2018 FINDINGS: Cardiac shadows within normal limits. Patchy infiltrates are noted bilaterally left greater than right but slightly improved when compared with the prior exam. Cavitary lesion remains in the left base. Tracheostomy tube is noted in satisfactory position. No bony abnormality is noted. IMPRESSION: Bilateral infiltrates somewhat improved when compared with the prior exam with cavitary changes on the left. Electronically Signed   By: Alcide Clever M.D.   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 ID recommending bronch if antibiotic change needed but hypoxemia better 11/11/2018  P:   Antibiotics: Bactrim, ceftazidime as directed by ID.  Plan for 14 days (through 5/11, 5/12 respectively) Hold off on bronch given improvement in fio2 but get tracheal aspirate 11/11/2018       RENAL A:  Nil acute P:  monitro  ELECTROLYTES A:  High k P: lokelma Lasix x 1   GASTROINTESTINAL A:   PEG tub  P:   TF via PEG  HEMATOLOGIC A:  Anemia critical illness   P:  - PRBC for hgb </= 6.9gm%    - exceptions are   -  if ACS susepcted/confirmed then transfuse for hgb </= 8.0gm%,  or    -  active bleeding  with hemodynamic instability, then transfuse regardless of hemoglobin value   At at all times try to transfuse 1 unit prbc as possible with exception of active hemorrhage     ENDOCRINE A:   At risk hyperglycemia   P:   ssi  MSK/DERM x    Best practice:  Diet: TF via PEG tube.  VAP protocol (if indicated): bundle ordered.  DVT prophylaxis: Heparin SQ  GI prophylaxis: Protonix  Glucose control: SSI  Mobility: BR  Code Status: FULL  Family Communication: Mother updated on 5/5, planning further discussions in conjunction with neurology regarding her overall prognosis.  They are considering DNR status, possibly even withdrawal of care, but were not ready to make that decision formal yet .   Mom cried 11/08/2018 after hearing about lack of improvement, cavitary pneumonia and brain anoxia but said she still is praying/.   Called to give update on 11/10/2018 -> informed her ARDS with cavitation worse . Explained to mom again patient has MDR infection . She is frustrated. Will call ID Specialist -> ? Another antibiotics warranted  Per mom                                        5/10 - explained oxygenation and wbc better - continue same abx but will get tracheal aspirate   Disposition: ICU      ATTESTATION & SIGNATURE   The patient Michale C Bains is critically ill with multiple organ systems failure and requires high complexity decision making for assessment and support, frequent evaluation and titration of therapies, application of advanced monitoring technologies and extensive interpretation of multiple databases.   Critical Care Time devoted to patient care services described in this note is  30  Minutes. This time reflects time of care of this signee Dr Kalman Shan. This critical care time does not reflect procedure time, or teaching time or supervisory time of PA/NP/Med student/Med  Resident etc but could involve care discussion time     Dr. Kalman ShanMurali Awais Cobarrubias, M.D.,  [facility] Pulmonary and Critical Care Medicine Staff Physician [facility] [place] [facility] and Critical Care Pager: [phone], If no answer or between  15:00h - 7:00h: call [phone]  11/11/2018 11:53 AM

## 2018-11-11 NOTE — Progress Notes (Signed)
RN noticed heart rate increasing gradually from mid 100's to low 130's. Patient appears to be moving tongue slightly with/without stimulation. Patient's temp is 97.1 axillary.  [facility] called and notified. Waiting for orders.

## 2018-11-12 ENCOUNTER — Inpatient Hospital Stay (HOSPITAL_COMMUNITY): Payer: Medicaid Other

## 2018-11-12 LAB — BLOOD GAS, ARTERIAL
Acid-Base Excess: 19.4 mmol/L — ABNORMAL HIGH (ref 0.0–2.0)
Bicarbonate: 45.8 mmol/L — ABNORMAL HIGH (ref 20.0–28.0)
Drawn by: 246101
FIO2: 60
O2 Saturation: 90.1 %
PEEP: 14 cmH2O
Patient temperature: 97.7
Pressure control: 40 cmH2O
RATE: 26 resp/min
pCO2 arterial: 78.8 mmHg (ref 32.0–48.0)
pH, Arterial: 7.379 (ref 7.350–7.450)
pO2, Arterial: 59 mmHg — ABNORMAL LOW (ref 83.0–108.0)

## 2018-11-12 LAB — GLUCOSE, CAPILLARY
Glucose-Capillary: 112 mg/dL — ABNORMAL HIGH (ref 70–99)
Glucose-Capillary: 120 mg/dL — ABNORMAL HIGH (ref 70–99)
Glucose-Capillary: 127 mg/dL — ABNORMAL HIGH (ref 70–99)
Glucose-Capillary: 149 mg/dL — ABNORMAL HIGH (ref 70–99)
Glucose-Capillary: 75 mg/dL (ref 70–99)
Glucose-Capillary: 80 mg/dL (ref 70–99)

## 2018-11-12 LAB — CBC WITH DIFFERENTIAL/PLATELET
Abs Immature Granulocytes: 0.07 10*3/uL (ref 0.00–0.07)
Basophils Absolute: 0 10*3/uL (ref 0.0–0.1)
Basophils Relative: 0 %
Eosinophils Absolute: 0.4 10*3/uL (ref 0.0–0.5)
Eosinophils Relative: 3 %
HCT: 24.3 % — ABNORMAL LOW (ref 36.0–46.0)
Hemoglobin: 6.7 g/dL — CL (ref 12.0–15.0)
Immature Granulocytes: 1 %
Lymphocytes Relative: 18 %
Lymphs Abs: 2 10*3/uL (ref 0.7–4.0)
MCH: 23.9 pg — ABNORMAL LOW (ref 26.0–34.0)
MCHC: 27.6 g/dL — ABNORMAL LOW (ref 30.0–36.0)
MCV: 86.8 fL (ref 80.0–100.0)
Monocytes Absolute: 1.3 10*3/uL — ABNORMAL HIGH (ref 0.1–1.0)
Monocytes Relative: 11 %
Neutro Abs: 7.5 10*3/uL (ref 1.7–7.7)
Neutrophils Relative %: 67 %
Platelets: 270 10*3/uL (ref 150–400)
RBC: 2.8 MIL/uL — ABNORMAL LOW (ref 3.87–5.11)
RDW: 26.9 % — ABNORMAL HIGH (ref 11.5–15.5)
WBC: 11.3 10*3/uL — ABNORMAL HIGH (ref 4.0–10.5)
nRBC: 1.1 % — ABNORMAL HIGH (ref 0.0–0.2)

## 2018-11-12 LAB — MINIMUM INHIBITORY CONC. (1 DRUG)

## 2018-11-12 LAB — HEMOGLOBIN AND HEMATOCRIT, BLOOD
HCT: 31 % — ABNORMAL LOW (ref 36.0–46.0)
Hemoglobin: 8.8 g/dL — ABNORMAL LOW (ref 12.0–15.0)

## 2018-11-12 LAB — MIC RESULT

## 2018-11-12 LAB — BASIC METABOLIC PANEL
Anion gap: 7 (ref 5–15)
BUN: 12 mg/dL (ref 6–20)
CO2: 38 mmol/L — ABNORMAL HIGH (ref 22–32)
Calcium: 7.8 mg/dL — ABNORMAL LOW (ref 8.9–10.3)
Chloride: 87 mmol/L — ABNORMAL LOW (ref 98–111)
Creatinine, Ser: <0.3 mg/dL — ABNORMAL LOW (ref 0.44–1.00)
Glucose, Bld: 105 mg/dL — ABNORMAL HIGH (ref 70–99)
Potassium: 3.9 mmol/L (ref 3.5–5.1)
Sodium: 132 mmol/L — ABNORMAL LOW (ref 135–145)

## 2018-11-12 LAB — SUSCEPTIBILITY RESULT

## 2018-11-12 LAB — PREPARE RBC (CROSSMATCH)

## 2018-11-12 LAB — SUSCEPTIBILITY, AER + ANAEROB

## 2018-11-12 LAB — MAGNESIUM: Magnesium: 1.4 mg/dL — ABNORMAL LOW (ref 1.7–2.4)

## 2018-11-12 MED ORDER — MAGNESIUM SULFATE 2 GM/50ML IV SOLN
2.0000 g | Freq: Once | INTRAVENOUS | Status: AC
  Administered 2018-11-12: 2 g via INTRAVENOUS
  Filled 2018-11-12: qty 50

## 2018-11-12 MED ORDER — SODIUM CHLORIDE 0.9% IV SOLUTION
Freq: Once | INTRAVENOUS | Status: AC
  Administered 2018-11-12: 06:00:00 via INTRAVENOUS

## 2018-11-12 NOTE — Progress Notes (Signed)
eLink Physician-Brief Progress Note Patient Name: Kathy Buckley DOB: 04/18/1991 MRN: [id number]   Date of Service  11/12/2018  HPI/Events of Note  Multiple issues: 1. Mg++ = 1.4 and Creatinine < 0.3 and 2. Hgb = 6.7.  eICU Interventions  Will order: 1. Transfuse 1 unit PRBC. 2. Replace Mg++.     Intervention Category Major Interventions: Electrolyte abnormality - evaluation and management;Other:  Lenell Antu 11/12/2018, 5:42 AM

## 2018-11-12 NOTE — Progress Notes (Signed)
Patient SPO2 level dropped consistently to 87-88% despite position changes and suctioning. Respiratory therapy and [facility] physician notified and instructed this nurse to increase patient FiO2 to 70%. Patient oxygen saturation level raised into ordered parameters following change. Will continue to monitor.   Keiyon Plack A Ewel Lona 11/11/2018 2230

## 2018-11-12 NOTE — Progress Notes (Signed)
eLink Physician-Brief Progress Note Patient Name: ARACELIE LAUBY DOB: [birth year] MRN: [id number]   Date of Service  11/12/2018  HPI/Events of Note  Request for AM lab orders.   eICU Interventions  Will order: 1. CBC with platelets, BMP and Mg++ level in AM.        Sommer,Steven Eugene 11/12/2018, 1:40 AM

## 2018-11-12 NOTE — Progress Notes (Addendum)
NAME:  Kathy Buckley, MRN:  [id number], DOB:  11/22/1990, LOS: 37 ADMISSION DATE:  10/28/2018, CONSULTATION DATE: October 25, 2018 REFERRING MD: Dr. [place] Singer, CHIEF COMPLAINT: Hypoxia  Brief History   28 year old female requiring chronic vent secondary to anoxic injury in 2019.  Recently admitted for pneumonia and returning from [place] for worsening vent needs.  She resides at [facility] with baseline GCS is 3.  She was recently admitted on 4/19 to [facility] with worsening oxygenation.  She was ruled out for COVID-19 at that time with a negative nasopharyngeal swab.  Pneumonia was presumed to be bacterial and she was treated with cefepime, Flagyl, and vancomycin. ( Fever and tachycardia, which were the reasons for her presentation) had resolved and she was transferred back to [facility] the following day on 4/20.  Tracheal aspirate taken while inpatient was positive for multidrug-resistant Achromobacter.  Repeat aspirate during this admit showed stenotrophomonas Course complicated by seizure episode on 4/26?  Myoclonus  Past Medical History   has a past medical history of Acute respiratory failure ([place]), Anoxic brain damage ([place]), Cardiac arrest ([place]), Central corneal ulcer, left eye, Idiopathic hypotension, Tachyarrhythmia, and Viral hepatitis.  Significant Hospital Events   4/19 admit to [facility] for PNA 4/20 Transfer to [facility] 4/23 Admit to [facility] for hypoxia 4/28 fever 101, PEEP increased to 10  Consults:  N/A  Procedures:  4/25 - tracheostomy change to 8 Portex. 4/25 - bronchoscopy shows small amount white secretions.  5/1 - Afebrile last 24 hours. She remains on PEEP of 10, FiO2 down to 60%. Urine output improved with Lasix, Na normal with free water at 400cc q4h. RN concerned about frequent desaturations but no mucus aspirated 5/2 - worsening hypoxemia. Now 80% fio2, peep 10. Not on pressors . Febrile and high wbc again + 5/6 - No significant changes this morning.  Her FiO2 has been  decreased to 0.85. She remains on pressure control ventilation, tidal volumes in the 200s 5/7 -  80% fio2, peep up at 10. RT says lungs very stiff . Worsenign cavitary lung disease on left  . Mom informed  90% fio2, 10 peep. Constipated. ABd distended but tolerating tube feeds/. Unresponsive . K going up - bactrim suspected per pharmacy  5/9 - overnight higher PiP . On 90% fio2 and peep 14. Wosening and mulitple cavitations on lung  Significant Diagnostic Tests:  EEG 4/28 >> neg  Micro Data:  4/19 trach aspirate >> MDR Achromobacter.   4/19 - COVID negative 4/24- covid negative 4/25 tracheal aspirate - >100,000 Stenotrophomonas, 40 k pseudomonas 5/10 - trach aspirate  Antimicrobials:  Cefepime 4/19 > 4/23 Flagyl 4/19 > 4/23 Vancomycin 4/19 > 4/23 Meropenem 4/23 >>>4/26 ....................... Bactrim 4/26 >> 5/11 ceftax 4/29 >>  Interim history/subjective:  Labile oxygen needs, FiO2 up to 90%, PEEP remains 14 Changed to pressure control ventilation earlier today due to high peak pressures, frequent alarming  Objective   Blood pressure 110/76, pulse 100, temperature 97.7 F (36.5 C), temperature source Oral, resp. rate (!) 26, height _0  (1.651 m), weight 78 kg, SpO2 92 %.    Vent Mode: PCV FiO2 (%):  [60 %-90 %] 60 % Set Rate:  [26 bmp] 26 bmp Vt Set:  [400 mL] 400 mL PEEP:  [14 cmH20] 14 cmH20 Plateau Pressure:  [43 cmH20-51 cmH20] 49 cmH20   Intake/Output Summary (Last 24 hours) at 11/12/2018 1317 Last data filed at 11/12/2018 1300 Gross per 24 hour  Intake 3658.55 ml  Output 1745 ml  Net 1913.55 ml   Filed Weights   11/10/18 0418 11/11/18 0348 11/12/18 0351  Weight: 78.2 kg 78.1 kg 78 kg    General Appearance:  Looks criticall ill OBESE - + HEENT: Somewhat protruding tongue, scleral edema present Neck: No lymphadenopathy, trachea midposition Lungs: Decreased at both bases, coarse, no wheezing Heart: Regular, no murmur Abdomen: Soft, benign, nondistended,  positive bowel sounds, PEG in place Extremities: No edema Skin: No rash Neurologic: Comatose on no sedation, unchanged    LABS    PULMONARY Recent Labs  Lab 11/09/18 2220 11/10/18 0158 11/12/18 1210  PHART 7.247* 7.368 7.379  PCO2ART 119* 93.6* 78.8*  PO2ART 118* 139* 59.0*  HCO3 49.6* 52.4* 45.8*  O2SAT 97.3 98.6 90.1    CBC Recent Labs  Lab 11/08/18 0459 11/09/18 0318 11/12/18 0420  HGB 7.7* 8.0* 6.7*  HCT 28.4* 28.3* 24.3*  WBC 19.5* 17.7* 11.3*  PLT 278 320 270    COAGULATION No results for input(s): INR in the last 168 hours.  CARDIAC  No results for input(s): TROPONINI in the last 168 hours. No results for input(s): PROBNP in the last 168 hours.   CHEMISTRY Recent Labs  Lab 11/06/18 0432  11/07/18 0406 11/08/18 0459 11/09/18 0318 11/10/18 0228 11/12/18 0420  NA 133*   < > 135 129* 130* 133* 132*  K 4.4   < > 3.7 4.5 5.3* 4.6 3.9  CL 83*   < > 77* 76* 77* 79* 87*  CO2 41*   < > 46* 48* 48* 48* 38*  GLUCOSE 111*   < > 122* 192* 117* 84 105*  BUN 12   < > _0 CREATININE 0.39*   < > 0.38* 0.30* <0.30* <0.30* <0.30*  CALCIUM 8.8*   < > 8.6* 9.0 9.0 8.9 7.8*  MG 1.9  --  1.6* 1.5* 2.0  --  1.4*  PHOS 2.8  --  2.4* 2.6 3.4  --   --    < > = values in this interval not displayed.   CrCl cannot be calculated (This lab value cannot be used to calculate CrCl because it is not a number: <0.30).   LIVER No results for input(s): AST, ALT, ALKPHOS, BILITOT, PROT, ALBUMIN, INR in the last 168 hours.   INFECTIOUS No results for input(s): LATICACIDVEN, PROCALCITON in the last 168 hours.   ENDOCRINE CBG (last 3)  Recent Labs    11/12/18 0352 11/12/18 0726 11/12/18 1109  GLUCAP 112* 149* 80         Assessment & Plan:     ASSESSMENT / PLAN:  PULMONARY A:  Acute on chronic respiratory failure - currently with worsening oxygenation felt due to VAP (hx MDR achromobacter PNA in the past, currently with stenotrophomonas and  pseudomonas)  P:   Changed back to pressure control on 5/11.  Appears to be tolerating with VT approximately 350 cc.  Wean FiO2 first, then goal wean PEEP.  Will need to follow ABG on this mode ventilation Bronchodilators Pulmonary hygiene Antibiotics as below  NEUROLOGIC A:   Chronic coma.  P:   Appreciate neurology input.  Prognosis poor for any significant neurological recovery.  Has been discussed with family but they have not been willing to accept a transition to comfort measures   INFECTIOUS A:   MDR VAP/HCAP P:   Continue antibiotics as described: Bactrim, ceftazidime as directed by ID.  Plan for 14 days (through 5/11, 5/12 respectively) Will obtain mini BAL or tracheal  aspirate 5/11 Consider CT chest if oxygen and PEEP needs stabilize to better characterize persistence of infiltrates, possible cavitary lesions    GASTROINTESTINAL A:   Nutrition P:   Tube feeding via PEG tube  HEMATOLOGIC A:  Anemia of critical illness P:  Transfusion goal Hgb > 7.0  ENDOCRINE A:   Hyperglycemia P:   On a scale insulin as per protocol    Best practice:  Diet: TF via PEG tube.  VAP protocol (if indicated): bundle ordered.  DVT prophylaxis: Heparin SQ  GI prophylaxis: Protonix  Glucose control: SSI  Mobility: BR  Code Status: FULL  Family Communication: Mother updated on 5/5, planning further discussions in conjunction with neurology regarding her overall prognosis.  They are considering DNR status, possibly even withdrawal of care, but were not ready to make that decision formal yet .   Mom cried 11/08/2018 after hearing about lack of improvement, cavitary pneumonia and brain anoxia but said she still is praying/.   Called to give update on 11/10/2018 -> informed her ARDS with cavitation worse . Explained to mom again patient has MDR infection . She is frustrated. Will call ID Specialist -> ? Another antibiotics warranted  Per mom                                         5/10 - explained oxygenation and wbc better - continue same abx but will get tracheal aspirate  Disposition: ICU    Independent CC time 32 minutes   Baltazar Apo, MD, PhD 11/12/2018, 1:25 PM [place] Pulmonary and Critical Care [phone] or if no answer [phone]

## 2018-11-13 ENCOUNTER — Inpatient Hospital Stay (HOSPITAL_COMMUNITY): Payer: Medicaid Other

## 2018-11-13 LAB — TYPE AND SCREEN
ABO/RH(D): O POS
Antibody Screen: NEGATIVE
Unit division: 0

## 2018-11-13 LAB — BLOOD GAS, ARTERIAL
Acid-Base Excess: 20 mmol/L — ABNORMAL HIGH (ref 0.0–2.0)
Bicarbonate: 45.1 mmol/L — ABNORMAL HIGH (ref 20.0–28.0)
Drawn by: 55062
FIO2: 60
O2 Saturation: 92.7 %
PEEP: 14 cmH2O
Patient temperature: 98.6
Pressure control: 40 cmH2O
RATE: 26 resp/min
pCO2 arterial: 57.7 mmHg — ABNORMAL HIGH (ref 32.0–48.0)
pH, Arterial: 7.505 — ABNORMAL HIGH (ref 7.350–7.450)
pO2, Arterial: 59.2 mmHg — ABNORMAL LOW (ref 83.0–108.0)

## 2018-11-13 LAB — POCT I-STAT 7, (LYTES, BLD GAS, ICA,H+H)
Acid-Base Excess: 20 mmol/L — ABNORMAL HIGH (ref 0.0–2.0)
Bicarbonate: 50.1 mmol/L — ABNORMAL HIGH (ref 20.0–28.0)
Calcium, Ion: 1.3 mmol/L (ref 1.15–1.40)
HCT: 33 % — ABNORMAL LOW (ref 36.0–46.0)
Hemoglobin: 11.2 g/dL — ABNORMAL LOW (ref 12.0–15.0)
O2 Saturation: 89 %
Patient temperature: 94.9
Potassium: 4.5 mmol/L (ref 3.5–5.1)
Sodium: 131 mmol/L — ABNORMAL LOW (ref 135–145)
TCO2: >50 mmol/L — ABNORMAL HIGH (ref 22–32)
pCO2 arterial: 82.8 mmHg (ref 32.0–48.0)
pH, Arterial: 7.38 (ref 7.350–7.450)
pO2, Arterial: 55 mmHg — ABNORMAL LOW (ref 83.0–108.0)

## 2018-11-13 LAB — CBC
HCT: 32.3 % — ABNORMAL LOW (ref 36.0–46.0)
Hemoglobin: 9.3 g/dL — ABNORMAL LOW (ref 12.0–15.0)
MCH: 24.1 pg — ABNORMAL LOW (ref 26.0–34.0)
MCHC: 28.8 g/dL — ABNORMAL LOW (ref 30.0–36.0)
MCV: 83.7 fL (ref 80.0–100.0)
Platelets: 293 10*3/uL (ref 150–400)
RBC: 3.86 MIL/uL — ABNORMAL LOW (ref 3.87–5.11)
RDW: 24.8 % — ABNORMAL HIGH (ref 11.5–15.5)
WBC: 14 10*3/uL — ABNORMAL HIGH (ref 4.0–10.5)
nRBC: 0.5 % — ABNORMAL HIGH (ref 0.0–0.2)

## 2018-11-13 LAB — BASIC METABOLIC PANEL
Anion gap: 18 — ABNORMAL HIGH (ref 5–15)
BUN: 12 mg/dL (ref 6–20)
CO2: 39 mmol/L — ABNORMAL HIGH (ref 22–32)
Calcium: 9.7 mg/dL (ref 8.9–10.3)
Chloride: 72 mmol/L — ABNORMAL LOW (ref 98–111)
Creatinine, Ser: 0.42 mg/dL — ABNORMAL LOW (ref 0.44–1.00)
GFR calc Af Amer: >60 mL/min (ref >60)
GFR calc non Af Amer: >60 mL/min (ref >60)
Glucose, Bld: 91 mg/dL (ref 70–99)
Potassium: 4.6 mmol/L (ref 3.5–5.1)
Sodium: 129 mmol/L — ABNORMAL LOW (ref 135–145)

## 2018-11-13 LAB — MAGNESIUM: Magnesium: 2.1 mg/dL (ref 1.7–2.4)

## 2018-11-13 LAB — BPAM RBC
Blood Product Expiration Date: 202005142359
ISSUE DATE / TIME: 202005110756
Unit Type and Rh: 9500

## 2018-11-13 LAB — GLUCOSE, CAPILLARY
Glucose-Capillary: 73 mg/dL (ref 70–99)
Glucose-Capillary: 74 mg/dL (ref 70–99)
Glucose-Capillary: 83 mg/dL (ref 70–99)
Glucose-Capillary: 84 mg/dL (ref 70–99)
Glucose-Capillary: 91 mg/dL (ref 70–99)
Glucose-Capillary: 93 mg/dL (ref 70–99)
Glucose-Capillary: 98 mg/dL (ref 70–99)

## 2018-11-13 MED ORDER — DEXTROSE 50 % IV SOLN: 50.0000 mL | Freq: Once | INTRAVENOUS | Status: DC

## 2018-11-13 MED ORDER — OSMOLITE 1.2 CAL PO LIQD
1000.0000 mL | ORAL | Status: DC
  Administered 2018-11-13 – 2018-11-19 (×8): 1000 mL
  Filled 2018-11-13 (×6): qty 1000

## 2018-11-13 NOTE — Progress Notes (Addendum)
NAME:  Kathy Buckley, MRN:  [id number], DOB:  27-Oct-1990, LOS: 19 ADMISSION DATE:  11/08/2018, CONSULTATION DATE: 11-08-18 REFERRING MD: Dr. 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  Recently admitted for pneumonia and returning from [facility] for worsening vent needs.  She resides at [facility] with baseline GCS is 3.  She was recently admitted on 4/19 to [facility] with worsening oxygenation.  She was ruled out for COVID-19 at that time with a negative nasopharyngeal swab.  Pneumonia was presumed to be bacterial and she was treated with cefepime, Flagyl, and vancomycin. ( Fever and tachycardia, which were the reasons for her presentation) had resolved and she was transferred back to [facility] the following day on 4/20.  Tracheal aspirate taken while inpatient was positive for multidrug-resistant Achromobacter.  Repeat aspirate during this admit showed stenotrophomonas Course complicated by seizure episode on 4/26?  Myoclonus  Past Medical History   has a past medical history of Acute respiratory failure (HCC), Anoxic brain damage (HCC), Cardiac arrest (HCC), Central corneal ulcer, left eye, Idiopathic hypotension, Tachyarrhythmia, and Viral hepatitis.  Significant Hospital Events   4/19 admit to [facility] for PNA 4/20 Transfer to [facility] 4/23 Admit to [facility] for hypoxia 4/28 fever 101, PEEP increased to 10  Consults:  N/A  Procedures:  4/25 - tracheostomy change to 8 Portex. 4/25 - bronchoscopy shows small amount white secretions.  5/1 - Afebrile last 24 hours. She remains on PEEP of 10, FiO2 down to 60%. Urine output improved with Lasix, Na normal with free water at 400cc q4h. RN concerned about frequent desaturations but no mucus aspirated 5/2 - worsening hypoxemia. Now 80% fio2, peep 10. Not on pressors . Febrile and high wbc again + 5/6 - No significant changes this morning.  Her FiO2 has been  decreased to 0.85. She remains on pressure control ventilation, tidal volumes in the 200s 5/7 -  80% fio2, peep up at 10. RT says lungs very stiff . Worsenign cavitary lung disease on left  . Mom informed  90% fio2, 10 peep. Constipated. ABd distended but tolerating tube feeds/. Unresponsive . K going up - bactrim suspected per pharmacy  5/9 - overnight higher PiP . On 90% fio2 and peep 14. Wosening and mulitple cavitations on lung  Significant Diagnostic Tests:  EEG 4/28 >> neg  Micro Data:  4/19 trach aspirate >> MDR Achromobacter.   4/19 - COVID negative 4/24- covid negative 4/25 tracheal aspirate - > 100,000 Stenotrophomonas (pan-S), 40 k pseudomonas (R imipenem, S zosyn) 5/2 resp cx >> moderate Pseudomonas (R imipenem), rare Klebsiella (pan-R) 5/11 - trach aspirate >> Pseudomonas >>   Antimicrobials:  Cefepime 4/19 > 4/23 Flagyl 4/19 > 4/23 Vancomycin 4/19 > 4/23 Meropenem 4/23 >>>4/26 ....................... Bactrim 4/26 >> 5/11 ceftax 4/29 >>5/6 Avycaz 5/6 >>   Interim history/subjective:  Had abdominal distention overnight, decreased abdominal compliance with decreased tidal volumes on pressure control ventilation.  Her tube feeding was held and the stomach decompressed with some improvement  Objective   Blood pressure 113/75, pulse 92, temperature 97.7 F (36.5 C), temperature source Oral, resp. rate (!) 26, height 5\' 5"  (1.651 m), weight 80.3 kg, SpO2 96 %.    Vent Mode: PCV FiO2 (%):  [60 %-80 %] 60 % Set Rate:  [26 bmp] 26 bmp PEEP:  [14 cmH20] 14 cmH20 Plateau Pressure:  [48 cmH20-50 cmH20] 50 cmH20   Intake/Output Summary (Last 24  hours) at 11/13/2018 1003 Last data filed at 11/13/2018 0900 Gross per 24 hour  Intake 2347.01 ml  Output 1240 ml  Net 1107.01 ml   Filed Weights   11/11/18 0348 11/12/18 0351 11/13/18 0500  Weight: 78.1 kg 78 kg 80.3 kg    General Appearance: Critically ill-appearing woman, ventilated HEENT: Scleral edema, somewhat  protruding tongue, tracheostomy in place Neck: No lymphadenopathy, trachea midline, tracheostomy CDI Lungs: Decreased at both bases, coarse, no wheezing Heart: Regular, distant, no murmur Abdomen: Soft, no longer distended, no apparent tenderness, PEG in place, positive bowel sounds Extremities: No edema Skin: No rash Neurologic: Comatose on no sedation, unchanged.  She is able to breathe over the set rate on the ventilator    LABS    PULMONARY Recent Labs  Lab 11/09/18 2220 11/10/18 0158 11/12/18 1210 11/13/18 0350  PHART 7.247* 7.368 7.379 7.505*  PCO2ART 119* 93.6* 78.8* 57.7*  PO2ART 118* 139* 59.0* 59.2*  HCO3 49.6* 52.4* 45.8* 45.1*  O2SAT 97.3 98.6 90.1 92.7    CBC Recent Labs  Lab 11/09/18 0318 11/12/18 0420 11/12/18 1300 11/13/18 0521  HGB 8.0* 6.7* 8.8* 9.3*  HCT 28.3* 24.3* 31.0* 32.3*  WBC 17.7* 11.3*  --  14.0*  PLT 320 270  --  293    COAGULATION No results for input(s): INR in the last 168 hours.  CARDIAC  No results for input(s): TROPONINI in the last 168 hours. No results for input(s): PROBNP in the last 168 hours.   CHEMISTRY Recent Labs  Lab 11/07/18 0406 11/08/18 0459 11/09/18 0318 11/10/18 0228 11/12/18 0420 11/13/18 0521  NA 135 129* 130* 133* 132* 129*  K 3.7 4.5 5.3* 4.6 3.9 4.6  CL 77* 76* 77* 79* 87* 72*  CO2 46* 48* 48* 48* 38* 39*  GLUCOSE 122* 192* 117* 84 105* 91  BUN 14 10 12 12 12 12   CREATININE 0.38* 0.30* <0.30* <0.30* <0.30* 0.42*  CALCIUM 8.6* 9.0 9.0 8.9 7.8* 9.7  MG 1.6* 1.5* 2.0  --  1.4* 2.1  PHOS 2.4* 2.6 3.4  --   --   --    Estimated Creatinine Clearance: 112.5 mL/min (A) (by C-G formula based on SCr of 0.42 mg/dL (L)).   LIVER No results for input(s): AST, ALT, ALKPHOS, BILITOT, PROT, ALBUMIN, INR in the last 168 hours.   INFECTIOUS No results for input(s): LATICACIDVEN, PROCALCITON in the last 168 hours.   ENDOCRINE CBG (last 3)  Recent Labs    11/12/18 2330 11/13/18 0336 11/13/18 0729   GLUCAP 120* 73 91         Assessment & Plan:     ASSESSMENT / PLAN:  Acute on chronic respiratory failure -worsening oxygenation due to polymicrobial MDR VAP with associated ALI (MDR Achromobacter, Stenotrophomonas, Pseudomonas and Klebsiella on cx data)  P:   Overall tolerated the change back to pressure control on 5/11.  Actually hyperventilated this morning 5/12 after stomach was decompressed and abdominal complaints improved.  Plan to aggressively wean FiO2 and PEEP. Follow ABG while using this mode, repeat midday 5/12 Pulmonary hygiene Bronchodilators Antibiotics as below.  Respiratory cultures repeated on 5/11  Longstanding coma P:   Appreciate neurology evaluation when she was transferred to the ICU.  MRI, EEG, their clinical assessment all confirm very poor prognosis for meaningful neurological recovery.  This is been discussed with the family but there are emotional barriers to accepting a transition to comfort care  Multidrug-resistant VAP/HCAP P:   Antibiotic course as outlined above Appreciate  ID assistance.  Most recent regimen: Bactrim, just completed on 5/11, and Avycaz started on 5/6. Interim respiratory culture obtained on 5/11, already growing Pseudomonas, sensitivities pending.  Note probable cavitary component in the left lower lobe by chest x-ray.  Consider CT chest if oxygen and PEEP needs improve enough for her to travel safely Need to consider fungal process, possible atypical infection.  Consider FOB for fungal, AFB.  Have deferred thus far given the presence of resistant organisms that are the presumed cause of her PNA Check serum Aspergillus antigen, QuantiFERON gold   Nutrition Abdominal distention P:   Feeding was held 5/11 due to abdominal distention.  The patient continues to have bowel movements, no evidence of ileus, SBO by KUB. Restart TF at trickle only   Anemia of critical illness P:  Transfusion goal Hgb > 7.0   Hyperglycemia P:    On sliding scale insulin per protocol   Best practice:  Diet: TF via PEG tube.  VAP protocol (if indicated): bundle ordered.  DVT prophylaxis: Heparin SQ  GI prophylaxis: Protonix  Glucose control: SSI  Mobility: BR  Code Status: FULL  Family Communication: Mother updated on 5/5, planning further discussions in conjunction with neurology regarding her overall prognosis.  They are considering DNR status, possibly even withdrawal of care, but were not ready to make that decision formal yet .   Mom cried 11/08/2018 after hearing about lack of improvement, cavitary pneumonia and brain anoxia but said she still is praying/.   Called to give update on 11/10/2018 -> informed her ARDS with cavitation worse . Explained to mom again patient has MDR infection . She is frustrated. ? Another antibiotics warranted per mom                                        Disposition: ICU    Independent CC time 33 minutes   Levy Pupa, MD, PhD 11/13/2018, 10:03 AM [facility] [phone] or if no answer [phone]

## 2018-11-13 NOTE — Progress Notes (Signed)
eLink Physician-Brief Progress Note Patient Name: Kathy Buckley DOB: 12-19-1990 MRN: [id number]   Date of Service  11/13/2018  HPI/Events of Note  Abdominal Distention - Interfering with mechanical ventilation.   eICU Interventions  Will order: 1. Portable abdominal film STAT. 2. PEG tube to LIS. 3. Will ask ground team to evaluate the patient at bedside.      Intervention Category Major Interventions: Other:  Lenell Antu 11/13/2018, 2:56 AM

## 2018-11-13 NOTE — Progress Notes (Signed)
RT NOTES: Critical ABG results given to Dr Delton Coombes. No changes ordered at this time.

## 2018-11-13 NOTE — Progress Notes (Signed)
Poor return Vt, 200-300cc, frequent HPL in PCV due to abdominal distension.  TF held & LIS per Dr. Dellie Catholic, producing bright green GI contents with sediment noted.  Improved return Vt at 450cc.

## 2018-11-14 ENCOUNTER — Inpatient Hospital Stay (HOSPITAL_COMMUNITY): Payer: Medicaid Other

## 2018-11-14 DIAGNOSIS — G40901 Epilepsy, unspecified, not intractable, with status epilepticus: Secondary | ICD-10-CM

## 2018-11-14 LAB — BASIC METABOLIC PANEL
Anion gap: 11 (ref 5–15)
BUN: 13 mg/dL (ref 6–20)
CO2: 43 mmol/L — ABNORMAL HIGH (ref 22–32)
Calcium: 9.7 mg/dL (ref 8.9–10.3)
Chloride: 81 mmol/L — ABNORMAL LOW (ref 98–111)
Creatinine, Ser: 0.51 mg/dL (ref 0.44–1.00)
Glucose, Bld: 109 mg/dL — ABNORMAL HIGH (ref 70–99)
Potassium: 3.9 mmol/L (ref 3.5–5.1)
Sodium: 135 mmol/L (ref 135–145)

## 2018-11-14 LAB — GLUCOSE, CAPILLARY
Glucose-Capillary: 92 mg/dL (ref 70–99)
Glucose-Capillary: 106 mg/dL — ABNORMAL HIGH (ref 70–99)
Glucose-Capillary: 131 mg/dL — ABNORMAL HIGH (ref 70–99)
Glucose-Capillary: 142 mg/dL — ABNORMAL HIGH (ref 70–99)
Glucose-Capillary: 99 mg/dL (ref 70–99)
Glucose-Capillary: 154 mg/dL — ABNORMAL HIGH (ref 70–99)

## 2018-11-14 LAB — POCT I-STAT 7, (LYTES, BLD GAS, ICA,H+H)
Acid-Base Excess: 21 mmol/L — ABNORMAL HIGH (ref 0.0–2.0)
Bicarbonate: 50 mmol/L — ABNORMAL HIGH (ref 20.0–28.0)
Calcium, Ion: 1.28 mmol/L (ref 1.15–1.40)
HCT: 35 % — ABNORMAL LOW (ref 36.0–46.0)
Hemoglobin: 11.9 g/dL — ABNORMAL LOW (ref 12.0–15.0)
O2 Saturation: 92 %
Patient temperature: 97.6
Potassium: 3.9 mmol/L (ref 3.5–5.1)
Sodium: 136 mmol/L (ref 135–145)
TCO2: >50 mmol/L — ABNORMAL HIGH (ref 22–32)
pCO2 arterial: 80.7 mmHg (ref 32.0–48.0)
pH, Arterial: 7.397 (ref 7.350–7.450)
pO2, Arterial: 68 mmHg — ABNORMAL LOW (ref 83.0–108.0)

## 2018-11-14 LAB — CBC
HCT: 30.6 % — ABNORMAL LOW (ref 36.0–46.0)
Hemoglobin: 8.9 g/dL — ABNORMAL LOW (ref 12.0–15.0)
MCH: 24.6 pg — ABNORMAL LOW (ref 26.0–34.0)
MCHC: 29.1 g/dL — ABNORMAL LOW (ref 30.0–36.0)
MCV: 84.5 fL (ref 80.0–100.0)
Platelets: 273 10*3/uL (ref 150–400)
RBC: 3.62 MIL/uL — ABNORMAL LOW (ref 3.87–5.11)
RDW: 25.2 % — ABNORMAL HIGH (ref 11.5–15.5)
WBC: 12 10*3/uL — ABNORMAL HIGH (ref 4.0–10.5)
nRBC: 0.8 % — ABNORMAL HIGH (ref 0.0–0.2)

## 2018-11-14 LAB — MAGNESIUM: Magnesium: 1.8 mg/dL (ref 1.7–2.4)

## 2018-11-14 MED ORDER — NOREPINEPHRINE 4 MG/250ML-% IV SOLN
0.0000 ug/min | INTRAVENOUS | Status: DC
  Administered 2018-11-14: 20:00:00 10 ug/min via INTRAVENOUS
  Administered 2018-11-15: 5 ug/min via INTRAVENOUS
  Administered 2018-11-15: 03:00:00 10 ug/min via INTRAVENOUS
  Administered 2018-11-16: 08:00:00 3 ug/min via INTRAVENOUS
  Administered 2018-11-17: 4 ug/min via INTRAVENOUS
  Administered 2018-11-17: 25 ug/min via INTRAVENOUS
  Filled 2018-11-14 (×6): qty 250

## 2018-11-14 MED ORDER — LEVETIRACETAM IN NACL 1000 MG/100ML IV SOLN
1000.0000 mg | INTRAVENOUS | Status: DC
  Filled 2018-11-14: qty 100

## 2018-11-14 MED ORDER — LORAZEPAM 2 MG/ML IJ SOLN
2.0000 mg | INTRAMUSCULAR | Status: DC | PRN
  Administered 2018-11-14 – 2018-11-15 (×3): 2 mg via INTRAVENOUS
  Filled 2018-11-14 (×2): qty 1

## 2018-11-14 MED ORDER — LEVETIRACETAM IN NACL 1000 MG/100ML IV SOLN
1000.0000 mg | Freq: Two times a day (BID) | INTRAVENOUS | Status: DC
  Administered 2018-11-14 – 2018-11-19 (×10): 1000 mg via INTRAVENOUS
  Filled 2018-11-14 (×11): qty 100

## 2018-11-14 MED ORDER — LORAZEPAM 2 MG/ML IJ SOLN
2.0000 mg | INTRAMUSCULAR | Status: DC | PRN
  Administered 2018-11-14: 2 mg via INTRAVENOUS

## 2018-11-14 MED ORDER — LEVETIRACETAM IN NACL 500 MG/100ML IV SOLN
500.0000 mg | Freq: Two times a day (BID) | INTRAVENOUS | Status: DC
  Filled 2018-11-14: qty 100

## 2018-11-14 MED ORDER — MIDAZOLAM HCL 2 MG/2ML IJ SOLN
INTRAMUSCULAR | Status: AC
  Filled 2018-11-14: qty 2

## 2018-11-14 MED ORDER — ACETAMINOPHEN 160 MG/5ML PO SOLN
650.0000 mg | Freq: Four times a day (QID) | ORAL | Status: DC | PRN
  Administered 2018-11-15 – 2018-11-18 (×4): 650 mg
  Filled 2018-11-14 (×4): qty 20.3

## 2018-11-14 MED ORDER — LEVETIRACETAM IN NACL 1000 MG/100ML IV SOLN
1000.0000 mg | Freq: Once | INTRAVENOUS | Status: AC
  Administered 2018-11-14: 1000 mg via INTRAVENOUS
  Filled 2018-11-14: qty 100

## 2018-11-14 MED ORDER — LORAZEPAM 2 MG/ML IJ SOLN
INTRAMUSCULAR | Status: AC
  Filled 2018-11-14: qty 1

## 2018-11-14 MED ORDER — PRO-STAT SUGAR FREE PO LIQD
30.0000 mL | Freq: Four times a day (QID) | ORAL | Status: DC
  Administered 2018-11-14 – 2018-11-19 (×20): 30 mL
  Filled 2018-11-14 (×19): qty 30

## 2018-11-14 MED ORDER — LORAZEPAM 2 MG/ML IJ SOLN
2.0000 mg | INTRAMUSCULAR | Status: DC | PRN
  Filled 2018-11-14: qty 1

## 2018-11-14 NOTE — Progress Notes (Signed)
Nutrition Follow-up RD working remotely.  DOCUMENTATION CODES:   Not applicable  INTERVENTION:   Advance TF slowly due to recent concern for intolerance with distended abdomen:   Osmolite 1.2, recommend increase by 10 ml every 12 hours to goal rate of 40 ml/h (960 ml per day)   Change Pro-stat to 30 ml QID (same total amount, but divided into smaller doses to help with tolerance)   At goal, this TF regimen provides 1552 kcal, 113 gm protein, 787 ml free water daily  NUTRITION DIAGNOSIS:   Inadequate oral intake related to inability to eat as evidenced by NPO status.  Ongoing  GOAL:   Patient will meet greater than or equal to 90% of their needs  Being addressed with TF  MONITOR:   TF tolerance, Labs, Skin, I & O's  ASSESSMENT:   28 yo female with PMH of anoxic brain injury in 2019, chronic trach/vent, G-tube, resident at [facility] who was admitted with hypoxia. COVID-19 negative.  Patient remains on chronic ventilator support via trach MV: 8.6 L/min Temp (24hrs), Avg:98.3 F (36.8 C), Min:97.5 F (36.4 C), Max:98.9 F (37.2 C)   Currently receiving Osmolite 1.2 via PEG at 25 ml/h with Prostat 60 ml BID to provide 1120 kcals, 93 gm protein, 492 ml free water daily.   TF was held on 5/11 for abdominal distention. Resumed at trickle rate 5/12. Plans to slowly advance feeding rate.   Labs and medications reviewed. Weight fluctuating some, but stable overall.  Patient with moderate generalized edema, mild pitting edema to BUE, and moderate pitting edema to BLE per RN documentation.   Diet Order:   Diet Order            Diet NPO time specified  Diet effective now              EDUCATION NEEDS:   No education needs have been identified at this time  Skin:  Skin Assessment: Reviewed RN Assessment(skin tear to buttocks)  Last BM:  5/13 (type 7)  Height:   Ht Readings from Last 1 Encounters:  2018/11/12 5\' 5"  (1.651 m)    Weight:   Wt Readings  from Last 1 Encounters:  11/14/18 80.5 kg    Ideal Body Weight:  56.8 kg  BMI:  Body mass index is 29.53 kg/m.  Estimated Nutritional Needs:   Kcal:  1575  Protein:  100-115 gm  Fluid:  1.6 L    Joaquin Courts, RD, LDN, CNSC Pager [phone] After Hours Pager [phone]

## 2018-11-14 NOTE — Progress Notes (Signed)
Reason for consult: seizures  Subjective: Patient had prolonged seizure around 6.30 pm lasting 9 min., Per nurse, characterized with head version and then tonic clonic jerking. She had further seizures around 7.30 PM. On assessment, seizures had stopped and was receiving 1g Keppra.    ROS:  Unable to obtain due to poor mental status  Examination  Vital signs in last 24 hours: Temp:  [97.5 F (36.4 C)-98.9 F (37.2 C)] 98.8 F (37.1 C) (05/13 1926) Pulse Rate:  [94-136] 132 (05/13 2035) Resp:  [13-27] 21 (05/13 2035) BP: (75-161)/(40-112) 122/69 (05/13 2035) SpO2:  [84 %-100 %] 100 % (05/13 2035) FiO2 (%):  [60 %-100 %] 100 % (05/13 1941) Weight:  [80.5 kg] 80.5 kg (05/13 0500)  General: lying in bed CVS: pulse-normal rate and rhythm RS: breathing with assistance of ventilator Extremities: normal   Neuro: Mental Status: Patient does not respond to verbal stimuli.  Does not respond to deep sternal rub.  Does not follow commands.  No verbalizations noted.  Cranial Nerves: II: patient does not respond confrontation bilaterally, pupils not reactive  III,IV,VI: doll's response absent bilaterally.  V,VII: corneal reflex: absent VIII: patient does not respond to verbal stimuli IX,X: gag reflex : weak to absent  XI: trapezius strength unable to test bilaterally XII: tongue strength unable to test Motor: Extremities spastic in B/L upper extremity with b/l clonus,  flaccid lower extremities does not withdraw to noxious stimulus Plantars: mute Cerebellar: Unable to perform  Basic Metabolic Panel: Recent Labs  Lab 11/08/18 0459 11/09/18 0318 11/10/18 0228 11/12/18 0420 11/13/18 0521 11/13/18 1445 11/14/18 0521 11/14/18 1121  NA 129* 130* 133* 132* 129* 131* 135 136  K 4.5 5.3* 4.6 3.9 4.6 4.5 3.9 3.9  CL 76* 77* 79* 87* 72*  --  81*  --   CO2 48* 48* 48* 38* 39*  --  43*  --   GLUCOSE 192* 117* 84 105* 91  --  109*  --   BUN 10 12 12 12 12   --  13  --   CREATININE  0.30* <0.30* <0.30* <0.30* 0.42*  --  0.51  --   CALCIUM 9.0 9.0 8.9 7.8* 9.7  --  9.7  --   MG 1.5* 2.0  --  1.4* 2.1  --  1.8  --   PHOS 2.6 3.4  --   --   --   --   --   --     CBC: Recent Labs  Lab 11/08/18 0459 11/09/18 0318 11/12/18 0420 11/12/18 1300 11/13/18 0521 11/13/18 1445 11/14/18 0521 11/14/18 1121  WBC 19.5* 17.7* 11.3*  --  14.0*  --  12.0*  --   NEUTROABS  --   --  7.5  --   --   --   --   --   HGB 7.7* 8.0* 6.7* 8.8* 9.3* 11.2* 8.9* 11.9*  HCT 28.4* 28.3* 24.3* 31.0* 32.3* 33.0* 30.6* 35.0*  MCV 85.5 85.8 86.8  --  83.7  --  84.5  --   PLT 278 320 270  --  293  --  273  --      Coagulation Studies: No results for input(s): LABPROT, INR in the last 72 hours.  Imaging Reviewed:   EEG on 4.28 : This recording shows severe voltage suppression and background slowing indicating a severe encephalopathy.  No epileptiform discharges are observed.     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  Neurology was consulted on 5/5 regarding prognostication, MRI performed consistent with anoxic injury. Patient also had questionable seizure on 4/23, EEG performed showed no epileptiform discharges.   Neurology reconsulted today evening for multiple seizures, one seizure lasting 9 min,resolved with Ativan. Loaded with Keppra and no further clinical seizures seen. Stat EEG performed showed brief bursts with GPEDs. Patient is not in nonconvulsive status epilepticus.   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 She is at high risk of cardiac death, septic shock, and sudden death in epilepsy.    Critical care time spent was 35 min including giving medications, examination, reviewing vitals, chart review, obtaining stat EEG.       Georgiana SpinnerSushanth Willo Yoon [facility] Pager Number [phone] For questions after 7pm please refer to AMION to reach the Neurologist on call

## 2018-11-14 NOTE — Progress Notes (Signed)
Patient desaturated to low 80's on 60% fio2, increased fio2 to 70%. Patient currently stat is 89. Will continue to monitor patient

## 2018-11-14 NOTE — Progress Notes (Signed)
EEG Complete  Results Pending 

## 2018-11-14 NOTE — Progress Notes (Signed)
PCCM Brief Update  Called to bedside to evaluate patient for seizure like activity. By time of arrival, seizure like activity had ceased. RN notes duration to be 9 minutes.   P Keppra load, then BID Keppra PRN Ativan If recurs, consult neuro, consider EEG, CT   Mother updated via telephone of event and plan   Additional Critical Care Time 33 min   Tessie Fass MSN, AGACNP-BC [facility] [phone] If no answer, [phone] 11/14/2018, 6:53 PM

## 2018-11-14 NOTE — Progress Notes (Signed)
Assisted tele visit to patient with mother.  Min Collymore M Nayquan Evinger, RN   
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
## 2018-11-14 NOTE — Progress Notes (Signed)
Notified by RN that patient has started seizing again. BP is soft, noted as below.    Blood pressure (!) 91/49, pulse (!) 117, temperature 98.8 F (37.1 C), temperature source Oral, resp. rate (!) 23, height 5\' 5"  (1.651 m), weight 80.5 kg, SpO2 95 %.  Has standing order for additional Ativan to given 2 mg IV now.   Will start levophed for goal MAP > 65 to facilitate meds required to abate seizures.  She is net +12 L.  Will check CVP, but doubtful she needs additional IVF given that she has had increasing FiO2 requirements and I/O balance.    Neurology was consulted for further recommendations and currently at bedside.     Posey Boyer, MSN, AGACNP-BC [facility] Pgr: [phone] or if no answer [phone] 11/14/2018, 7:44 PM

## 2018-11-14 NOTE — Progress Notes (Signed)
RT NOTES: Trach tube change due. Patient medically unstable at this time. Trach tube change on hold. CCM made aware.

## 2018-11-15 ENCOUNTER — Inpatient Hospital Stay (HOSPITAL_COMMUNITY): Payer: Medicaid Other

## 2018-11-15 LAB — BASIC METABOLIC PANEL
Anion gap: 9 (ref 5–15)
BUN: 15 mg/dL (ref 6–20)
CO2: 40 mmol/L — ABNORMAL HIGH (ref 22–32)
Calcium: 9.1 mg/dL (ref 8.9–10.3)
Chloride: 91 mmol/L — ABNORMAL LOW (ref 98–111)
Creatinine, Ser: 0.39 mg/dL — ABNORMAL LOW (ref 0.44–1.00)
GFR calc Af Amer: >60 mL/min (ref >60)
GFR calc non Af Amer: >60 mL/min (ref >60)
Glucose, Bld: 163 mg/dL — ABNORMAL HIGH (ref 70–99)
Potassium: 3 mmol/L — ABNORMAL LOW (ref 3.5–5.1)
Sodium: 140 mmol/L (ref 135–145)

## 2018-11-15 LAB — GLUCOSE, CAPILLARY
Glucose-Capillary: 106 mg/dL — ABNORMAL HIGH (ref 70–99)
Glucose-Capillary: 111 mg/dL — ABNORMAL HIGH (ref 70–99)
Glucose-Capillary: 116 mg/dL — ABNORMAL HIGH (ref 70–99)
Glucose-Capillary: 133 mg/dL — ABNORMAL HIGH (ref 70–99)
Glucose-Capillary: 136 mg/dL — ABNORMAL HIGH (ref 70–99)
Glucose-Capillary: 155 mg/dL — ABNORMAL HIGH (ref 70–99)

## 2018-11-15 LAB — QUANTIFERON-TB GOLD PLUS: QuantiFERON-TB Gold Plus: UNDETERMINED — AB

## 2018-11-15 LAB — PHOSPHORUS: Phosphorus: 2.3 mg/dL — ABNORMAL LOW (ref 2.5–4.6)

## 2018-11-15 LAB — QUANTIFERON-TB GOLD PLUS (RQFGPL)
QuantiFERON Mitogen Value: 0.07 IU/mL
QuantiFERON Nil Value: 0.02 IU/mL
QuantiFERON TB1 Ag Value: 0.03 IU/mL
QuantiFERON TB2 Ag Value: 0.01 IU/mL

## 2018-11-15 LAB — MAGNESIUM: Magnesium: 1.6 mg/dL — ABNORMAL LOW (ref 1.7–2.4)

## 2018-11-15 MED ORDER — DEXTROSE 5 % IV SOLN: 2.5000 g | Freq: Three times a day (TID) | INTRAVENOUS | Status: DC

## 2018-11-15 MED ORDER — POTASSIUM PHOSPHATES 15 MMOLE/5ML IV SOLN
30.0000 mmol | Freq: Once | INTRAVENOUS | Status: AC
  Administered 2018-11-15: 12:00:00 30 mmol via INTRAVENOUS
  Filled 2018-11-15: qty 10

## 2018-11-15 MED ORDER — MAGNESIUM SULFATE 4 GM/100ML IV SOLN
4.0000 g | Freq: Once | INTRAVENOUS | Status: AC
  Administered 2018-11-15: 4 g via INTRAVENOUS
  Filled 2018-11-15: qty 100

## 2018-11-15 MED ORDER — DEXTROSE 5 % IV SOLN
2.5000 g | Freq: Three times a day (TID) | INTRAVENOUS | Status: DC
  Administered 2018-11-15 – 2018-11-18 (×10): 2.5 g via INTRAVENOUS
  Filled 2018-11-15 (×14): qty 12

## 2018-11-15 MED ORDER — DEXTROSE 5 % IV SOLN
2.5000 g | Freq: Three times a day (TID) | INTRAVENOUS | Status: DC
  Filled 2018-11-15 (×2): qty 12

## 2018-11-15 MED ORDER — POTASSIUM CHLORIDE 20 MEQ/15ML (10%) PO SOLN
40.0000 meq | Freq: Once | ORAL | Status: AC
  Administered 2018-11-15: 40 meq
  Filled 2018-11-15: qty 30
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
## 2018-11-15 NOTE — Progress Notes (Addendum)
Pt began seizing around 1930. Slight head twitches at first, progressing to more frequent and stronger. Lasted about one hour. Gave PRN 2mg  Ativan twice. MD notified.  Luvenia Heller, RN

## 2018-11-15 NOTE — Progress Notes (Signed)
Pt began seizing again. Slight head twitches. Gave PRN ativan 2mg  once. Will continue to monitor.   Luvenia Heller, RN

## 2018-11-15 NOTE — Procedures (Signed)
History: 28 year old female with a remote history of anoxic brain injury  Sedation: Ativan given for seizure earlier in the evening  Technique: This is a 21 channel routine scalp EEG performed at the bedside with bipolar and monopolar montages arranged in accordance to the international 10/20 system of electrode placement. One channel was dedicated to EKG recording.    Background: The background consists of flat suppressed pattern with superimposed brief runs of anteriorly predominant beta range activity lasting less than a second.  This occurs in a relatively periodic pattern with a frequency of 0.5 Hz.  There are occasional generalized bifrontally predominant sharp waves.  Photic stimulation: Physiologic driving is not performed  EEG Abnormalities: 1) generalized periodic discharges (GPD's) consisting of a brief anterior run of smoothly contoured beta activity. 2) occasional generalized epileptiform discharge 3) otherwise completely suppressed background  Clinical Interpretation: This EEG is consistent with a severe generalized cerebral dysfunction consistent with the patient's known history of severe anoxic brain injury.  There is evidence of some cortical irritability, but no ongoing seizure was recorded.   Ritta Slot, MD [facility] [phone]  If 7pm- 7am, please page neurology on call as listed in AMION.

## 2018-11-16 LAB — CULTURE, RESPIRATORY W GRAM STAIN

## 2018-11-16 LAB — COMPREHENSIVE METABOLIC PANEL
ALT: 30 U/L (ref 0–44)
AST: 34 U/L (ref 15–41)
Albumin: 1.7 g/dL — ABNORMAL LOW (ref 3.5–5.0)
Alkaline Phosphatase: 141 U/L — ABNORMAL HIGH (ref 38–126)
Anion gap: 5 (ref 5–15)
BUN: 20 mg/dL (ref 6–20)
CO2: 41 mmol/L — ABNORMAL HIGH (ref 22–32)
Calcium: 9.3 mg/dL (ref 8.9–10.3)
Chloride: 91 mmol/L — ABNORMAL LOW (ref 98–111)
Creatinine, Ser: 0.4 mg/dL — ABNORMAL LOW (ref 0.44–1.00)
GFR calc Af Amer: >60 mL/min (ref >60)
GFR calc non Af Amer: >60 mL/min (ref >60)
Glucose, Bld: 142 mg/dL — ABNORMAL HIGH (ref 70–99)
Potassium: 5.9 mmol/L — ABNORMAL HIGH (ref 3.5–5.1)
Sodium: 137 mmol/L (ref 135–145)
Total Bilirubin: 0.2 mg/dL — ABNORMAL LOW (ref 0.3–1.2)
Total Protein: 10 g/dL — ABNORMAL HIGH (ref 6.5–8.1)

## 2018-11-16 LAB — GLUCOSE, CAPILLARY
Glucose-Capillary: 120 mg/dL — ABNORMAL HIGH (ref 70–99)
Glucose-Capillary: 123 mg/dL — ABNORMAL HIGH (ref 70–99)
Glucose-Capillary: 129 mg/dL — ABNORMAL HIGH (ref 70–99)
Glucose-Capillary: 133 mg/dL — ABNORMAL HIGH (ref 70–99)
Glucose-Capillary: 135 mg/dL — ABNORMAL HIGH (ref 70–99)
Glucose-Capillary: 161 mg/dL — ABNORMAL HIGH (ref 70–99)

## 2018-11-16 LAB — PHOSPHORUS: Phosphorus: 3.9 mg/dL (ref 2.5–4.6)

## 2018-11-16 LAB — MAGNESIUM: Magnesium: 2.3 mg/dL (ref 1.7–2.4)

## 2018-11-16 MED ORDER — FUROSEMIDE 10 MG/ML IJ SOLN
40.0000 mg | Freq: Once | INTRAMUSCULAR | Status: AC
  Administered 2018-11-16: 40 mg via INTRAVENOUS
  Filled 2018-11-16: qty 4

## 2018-11-16 NOTE — Progress Notes (Signed)
NAME:  Kathy Buckley, MRN:  [id number], DOB:  05/09/1991, LOS: 22 ADMISSION DATE:  11-20-2018, CONSULTATION DATE: October 25, 2018 REFERRING MD: Dr. Juleen ChinaKohut, CHIEF COMPLAINT: Hypoxia  Brief History   28 yo female from [facility] with recurrent pneumonia and seizures.  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  Past Medical History  Cardiac arrest after post partum hemorrhage in 2019, Anoxic encephalopathy, Hepatitis, Idiopathic hypotension  Significant Hospital Events   4/19 admit to [facility] for PNA 4/20 Transfer to [facility] 4/23 Admit to [facility] for hypoxia 4/28 fever 101  Consults:  Neurology  Procedures:  4/25 change to portex 8 trach, bronch 5/02 fever, worsening hypoxia 5/05 recurrent seizure, neuro consulted 5/06 pressure control 5/14 restart antibiotics  Significant Diagnostic Tests:  EEG 4/28 >> severe voltage suppression MRI brain 5/05 >> severe diffuse cerebral atrophy with cortical thinning, marked diffuse ventriculomegaly EEG 5/13 >> GPD, occasional generalized epileptiform discharge, suppressed background  Micro Data:  4/19 trach aspirate > MDR Achromobacter.   4/19 COVID > negative 4/24 covid > negative 4/25 tracheal aspirate > 100,000 Stenotrophomonas (pan-S), 40 k pseudomonas (R imipenem, S zosyn) 5/02 resp cx > moderate Pseudomonas (R imipenem), rare Klebsiella (pan-R) 5/11 trach aspirate > Pseudomonas (s) zosyn  Antimicrobials:  Bactrim 4/26 >> 5/11 ceftax 4/29 >>5/6 Avycaz 5/6 >>   Interim history/subjective:  Remains on increased PEEP/FiO2.  Objective   Blood pressure 112/64, pulse (!) 115, temperature 99 F (37.2 C), temperature source Oral, resp. rate (!) 24, height 5\' 5"  (1.651 m), weight 78.8 kg, SpO2 93 %.    Vent Mode: PCV FiO2 (%):  [60 %-80 %] 80 % Set Rate:  [24 bmp] 24 bmp Vt Set:  [400 mL] 400 mL PEEP:  [10 cmH20] 10 cmH20 Plateau Pressure:  [35 cmH20-39 cmH20] 37 cmH20    Intake/Output Summary (Last 24 hours) at 11/16/2018 0859 Last data filed at 11/16/2018 0600 Gross per 24 hour  Intake 2464.47 ml  Output 765 ml  Net 1699.47 ml   Filed Weights   11/14/18 0500 11/15/18 0402 11/16/18 0453  Weight: 80.5 kg 81.1 kg 78.8 kg   Physical exam    General - unresponsive Eyes - injected conjuctiva ENT - ETT in place Cardiac - regular, tachycardic Chest - b/l rhonchi Abdomen - soft, mild distention, decreased bowel sounds, dull to percussion Extremities - 2+ edema Skin - no rashes Neuro - breaths over vent rate; not much else   Assessment & Plan:   Acute on chronic hypoxic, hypercapnic respiratory failure from recurrent PNA and pulmonary edema. Plan - pressure control - goal SpO2 88 to 95% - f/u CXR intermittently - try lasix 40 mg IV x one on 5/15  - scopolamine for respiratory secretions  Septic shock from recurrent PNA with multidrug resistant bacteria. Discussion: Most recent culture with Pseudomonas. Plan - day 2/14 of avycaz (d/w ID on 5/14) - pressors to keep MAP > 65  Recurrent seizures. Anoxic encephalopathy with comatose state. Plan - continue AEDs - ongoing d/w family about goals of care   Anemia of critical illness Plan - f/u CBC intermittently  Hyperkalemia. Plan - f/u BMET  Hyperglycemia. Plan - SSI   Best practice:  Diet: tube feed DVT prophylaxis: Heparin SQ GI prophylaxis: Pepcid Mobility: bed rest Code Status: full  Labs:   CMP Latest Ref Rng & Units 11/16/2018 11/15/2018 11/14/2018  Glucose 70 - 99 mg/dL 409(W142(H) 119(J163(H) -  BUN 6 - 20 mg/dL 20 15 -  Creatinine 0.44 - 1.00 mg/dL 6.71(I) 4.58(K) -  Sodium 135 - 145 mmol/L 137 140 136  Potassium 3.5 - 5.1 mmol/L 5.9(H) 3.0(L) 3.9  Chloride 98 - 111 mmol/L 91(L) 91(L) -  CO2 22 - 32 mmol/L 41(H) 40(H) -  Calcium 8.9 - 10.3 mg/dL 9.3 9.1 -  Total Protein 6.5 - 8.1 g/dL 10.0(H) - -  Total Bilirubin 0.3 - 1.2 mg/dL 9.9(I) - -  Alkaline Phos 38 - 126 U/L 141(H)  - -  AST 15 - 41 U/L 34 - -  ALT 0 - 44 U/L 30 - -   CBC Latest Ref Rng & Units 11/14/2018 11/14/2018 11/13/2018  WBC 4.0 - 10.5 K/uL - 12.0(H) -  Hemoglobin 12.0 - 15.0 g/dL 11.9(L) 8.9(L) 11.2(L)  Hematocrit 36.0 - 46.0 % 35.0(L) 30.6(L) 33.0(L)  Platelets 150 - 400 K/uL - 273 -   ABG    Component Value Date/Time   PHART 7.397 11/14/2018 1121   PCO2ART 80.7 (HH) 11/14/2018 1121   PO2ART 68.0 (L) 11/14/2018 1121   HCO3 50.0 (H) 11/14/2018 1121   TCO2 >50 (H) 11/14/2018 1121   O2SAT 92.0 11/14/2018 1121   CBG (last 3)  Recent Labs    11/15/18 2345 11/16/18 0342 11/16/18 0747  GLUCAP 111* 120* 123*    CC time 32 minutes  D/w Dr. Jerre Simon, MD [facility] Pulmonary/Critical Care 11/16/2018, 9:17 AM

## 2018-11-16 NOTE — Progress Notes (Signed)
I had lengthy d/w pt's mother.  Explained current status and recent MRI and EEG findings.  Explained Novena has irreversible brain injury and is completely dependent on life support.  Reviewed different treatment options to consider.  She and her family have struggled with this for months.  They are not ready to make the decision to stop therapies, and want to continue medical therapies as needed.  However, if Valen develops cardiac arrest the family agrees that she should not undergo CPR or defibrillation.  Limited resuscitation order entered.  Coralyn Helling, MD [facility] Pulmonary/Critical Care 11/16/2018, 3:01 PM

## 2018-11-17 ENCOUNTER — Inpatient Hospital Stay (HOSPITAL_COMMUNITY): Payer: Medicaid Other

## 2018-11-17 LAB — CBC
HCT: 34.8 % — ABNORMAL LOW (ref 36.0–46.0)
Hemoglobin: 8.6 g/dL — ABNORMAL LOW (ref 12.0–15.0)
MCH: 25.2 pg — ABNORMAL LOW (ref 26.0–34.0)
MCHC: 24.7 g/dL — ABNORMAL LOW (ref 30.0–36.0)
MCV: 102.1 fL — ABNORMAL HIGH (ref 80.0–100.0)
Platelets: 270 10*3/uL (ref 150–400)
RBC: 3.41 MIL/uL — ABNORMAL LOW (ref 3.87–5.11)
RDW: 24 % — ABNORMAL HIGH (ref 11.5–15.5)
WBC: 21.1 10*3/uL — ABNORMAL HIGH (ref 4.0–10.5)
nRBC: 2.3 % — ABNORMAL HIGH (ref 0.0–0.2)

## 2018-11-17 LAB — GLUCOSE, CAPILLARY
Glucose-Capillary: 120 mg/dL — ABNORMAL HIGH (ref 70–99)
Glucose-Capillary: 136 mg/dL — ABNORMAL HIGH (ref 70–99)
Glucose-Capillary: 147 mg/dL — ABNORMAL HIGH (ref 70–99)
Glucose-Capillary: 160 mg/dL — ABNORMAL HIGH (ref 70–99)
Glucose-Capillary: 182 mg/dL — ABNORMAL HIGH (ref 70–99)
Glucose-Capillary: 184 mg/dL — ABNORMAL HIGH (ref 70–99)

## 2018-11-17 LAB — BASIC METABOLIC PANEL
Anion gap: 7 (ref 5–15)
BUN: 46 mg/dL — ABNORMAL HIGH (ref 6–20)
CO2: 38 mmol/L — ABNORMAL HIGH (ref 22–32)
Calcium: 9.3 mg/dL (ref 8.9–10.3)
Chloride: 94 mmol/L — ABNORMAL LOW (ref 98–111)
Creatinine, Ser: 0.9 mg/dL (ref 0.44–1.00)
GFR calc Af Amer: >60 mL/min (ref >60)
GFR calc non Af Amer: >60 mL/min (ref >60)
Glucose, Bld: 220 mg/dL — ABNORMAL HIGH (ref 70–99)
Potassium: 6.2 mmol/L — ABNORMAL HIGH (ref 3.5–5.1)
Sodium: 139 mmol/L (ref 135–145)

## 2018-11-17 LAB — CARBAPENEM RESISTANCE PANEL
Carba Resistance IMP Gene: NOT DETECTED
Carba Resistance KPC Gene: DETECTED — AB
Carba Resistance NDM Gene: NOT DETECTED
Carba Resistance OXA48 Gene: NOT DETECTED
Carba Resistance VIM Gene: NOT DETECTED

## 2018-11-17 MED ORDER — FUROSEMIDE 10 MG/ML IJ SOLN
40.0000 mg | Freq: Three times a day (TID) | INTRAMUSCULAR | Status: AC
  Administered 2018-11-17 (×2): 40 mg via INTRAVENOUS
  Filled 2018-11-17 (×2): qty 4

## 2018-11-17 MED ORDER — SODIUM POLYSTYRENE SULFONATE 15 GM/60ML PO SUSP
15.0000 g | Freq: Once | ORAL | Status: AC
  Administered 2018-11-17: 15 g via ORAL
  Filled 2018-11-17: qty 60

## 2018-11-17 MED ORDER — SODIUM CHLORIDE 0.9 % IV BOLUS
500.0000 mL | Freq: Once | INTRAVENOUS | Status: AC
  Administered 2018-11-17: 17:00:00 500 mL via INTRAVENOUS

## 2018-11-17 MED ORDER — NOREPINEPHRINE 16 MG/250ML-% IV SOLN
0.0000 ug/min | INTRAVENOUS | Status: DC
  Administered 2018-11-17 – 2018-11-18 (×2): 20 ug/min via INTRAVENOUS
  Administered 2018-11-19 (×2): 25 ug/min via INTRAVENOUS
  Filled 2018-11-17 (×4): qty 250

## 2018-11-17 MED ORDER — AMIODARONE HCL 200 MG PO TABS
200.0000 mg | ORAL_TABLET | Freq: Every day | ORAL | Status: DC
  Administered 2018-11-17 – 2018-11-19 (×3): 200 mg via ORAL
  Filled 2018-11-17 (×3): qty 1

## 2018-11-17 MED ORDER — AMIODARONE IV BOLUS ONLY 150 MG/100ML
150.0000 mg | Freq: Once | INTRAVENOUS | Status: AC
  Administered 2018-11-17: 150 mg via INTRAVENOUS
  Filled 2018-11-17: qty 100

## 2018-11-17 MED ORDER — AMIODARONE LOAD VIA INFUSION: 150.0000 mg | Freq: Once | INTRAVENOUS | Status: DC

## 2018-11-17 MED ORDER — MIDODRINE HCL 5 MG PO TABS
5.0000 mg | ORAL_TABLET | Freq: Three times a day (TID) | ORAL | Status: DC
  Administered 2018-11-17 (×2): 5 mg via ORAL
  Filled 2018-11-17 (×4): qty 1

## 2018-11-17 NOTE — Progress Notes (Signed)
A-fib with RVR with borderline BP on EKG.  Start amiodarone drip  Alyson Reedy, M.D. [facility] Pulmonary/Critical Care Medicine. Pager: [phone]. After hours pager: [phone].

## 2018-11-17 NOTE — Progress Notes (Signed)
NAME:  Kathy Buckley, MRN:  [id number], DOB:  02-09-1991, LOS: 23 ADMISSION DATE:  10/10/2018, CONSULTATION DATE: October 25, 2018 REFERRING MD: Dr. Juleen China, CHIEF COMPLAINT: Hypoxia  Brief History   28 yo female from [facility] with recurrent pneumonia and seizures.  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  Past Medical History  Cardiac arrest after post partum hemorrhage in 2019, Anoxic encephalopathy, Hepatitis, Idiopathic hypotension  Significant Hospital Events   4/19 admit to [facility] for PNA 4/20 Transfer to [facility] 4/23 Admit to [facility] for hypoxia 4/28 fever 101  Consults:  Neurology  Procedures:  4/25 change to portex 8 trach, bronch 5/02 fever, worsening hypoxia 5/05 recurrent seizure, neuro consulted 5/06 pressure control 5/14 restart antibiotics  Significant Diagnostic Tests:  EEG 4/28 >> severe voltage suppression MRI brain 5/05 >> severe diffuse cerebral atrophy with cortical thinning, marked diffuse ventriculomegaly EEG 5/13 >> GPD, occasional generalized epileptiform discharge, suppressed background  Micro Data:  4/19 trach aspirate > MDR Achromobacter.   4/19 COVID > negative 4/24 covid > negative 4/25 tracheal aspirate > 100,000 Stenotrophomonas (pan-S), 40 k pseudomonas (R imipenem, S zosyn) 5/02 resp cx > moderate Pseudomonas (R imipenem), rare Klebsiella (pan-R) 5/11 trach aspirate > Pseudomonas (s) zosyn  Antimicrobials:  Bactrim 4/26 >> 5/11 ceftax 4/29 >>5/6 Avycaz 5/6 >>   Interim history/subjective:  No events overnight  Objective   Blood pressure (!) 107/52, pulse (!) 110, temperature 99.5 F (37.5 C), temperature source Oral, resp. rate (!) 0, height 5\' 5"  (1.651 m), weight 80.5 kg, SpO2 100 %.    Vent Mode: PCV FiO2 (%):  [80 %-100 %] 100 % Set Rate:  [18 bmp] 18 bmp Vt Set:  [400 mL] 400 mL PEEP:  [10 cmH20] 10 cmH20 Plateau Pressure:  [24 cmH20-39 cmH20] 24 cmH20   Intake/Output  Summary (Last 24 hours) at 11/17/2018 1008 Last data filed at 11/17/2018 0800 Gross per 24 hour  Intake 1531.36 ml  Output 475 ml  Net 1056.36 ml   Filed Weights   11/15/18 0402 11/16/18 0453 11/17/18 0500  Weight: 81.1 kg 78.8 kg 80.5 kg   Physical exam    General - Chronically ill appearing, unresponsive HEENT: [place]/AT, PERRL, EOM-I and Trach Cardiac - RRR, Nl S1/S2 and -M/R/G Chest - Coarse BS diffusely Abdomen - Soft, NT, ND and +BS Extremities - 2+ edema Skin - no rashes Neuro - Unresponsive   Assessment & Plan:   Acute on chronic hypoxic, hypercapnic respiratory failure from recurrent PNA and pulmonary edema. Plan - Maintain on full vent support - Titrate O2 for sat of 88-92% - Need to address palliative care needs when family is available - CXR PRN at this point - Lasix 40 mg IV x2 - Scopolamine for respiratory secretions  Septic shock from recurrent PNA with multidrug resistant bacteria. Discussion: Most recent culture with Pseudomonas. Plan - Day 3/14 of avycaz (d/w ID on 5/14) - Levophed for MAP of 65 mmHg  Recurrent seizures. Anoxic encephalopathy with comatose state. Plan - Continue AEDs - Ongoing d/w family about goals of care   Anemia of critical illness Plan - F/u CBC intermittently  Hyperkalemia. Plan - F/u BMET  Hyperglycemia. Plan - SSI  Will consult palliative on Monday as there is very R[place] else to be offered  Best practice:  Diet: tube feed DVT prophylaxis: Heparin SQ GI prophylaxis: Pepcid Mobility: bed rest Code Status: full  Labs:   CMP Latest Ref Rng & Units 11/17/2018  11/16/2018 11/15/2018  Glucose 70 - 99 mg/dL 161(W220(H) 960(A142(H) 540(J163(H)  BUN 6 - 20 mg/dL 81(X46(H) 20 15  Creatinine 0.44 - 1.00 mg/dL 9.140.90 7.82(N0.40(L) 5.62(Z0.39(L)  Sodium 135 - 145 mmol/L 139 137 140  Potassium 3.5 - 5.1 mmol/L 6.2(H) 5.9(H) 3.0(L)  Chloride 98 - 111 mmol/L 94(L) 91(L) 91(L)  CO2 22 - 32 mmol/L 38(H) 41(H) 40(H)  Calcium 8.9 - 10.3 mg/dL 9.3 9.3 9.1   Total Protein 6.5 - 8.1 g/dL - 10.0(H) -  Total Bilirubin 0.3 - 1.2 mg/dL - 3.0(Q0.2(L) -  Alkaline Phos 38 - 126 U/L - 141(H) -  AST 15 - 41 U/L - 34 -  ALT 0 - 44 U/L - 30 -   CBC Latest Ref Rng & Units 11/17/2018 11/14/2018 11/14/2018  WBC 4.0 - 10.5 K/uL 21.1(H) - 12.0(H)  Hemoglobin 12.0 - 15.0 g/dL 6.5(H8.6(L) 11.9(L) 8.9(L)  Hematocrit 36.0 - 46.0 % 34.8(L) 35.0(L) 30.6(L)  Platelets 150 - 400 K/uL 270 - 273   ABG    Component Value Date/Time   PHART 7.397 11/14/2018 1121   PCO2ART 80.7 (HH) 11/14/2018 1121   PO2ART 68.0 (L) 11/14/2018 1121   HCO3 50.0 (H) 11/14/2018 1121   TCO2 >50 (H) 11/14/2018 1121   O2SAT 92.0 11/14/2018 1121   CBG (last 3)  Recent Labs    11/16/18 2336 11/17/18 0345 11/17/18 0724  GLUCAP 133* 147* 136*   The patient is critically ill with multiple organ systems failure and requires high complexity decision making for assessment and support, frequent evaluation and titration of therapies, application of advanced monitoring technologies and extensive interpretation of multiple databases.   Critical Care Time devoted to patient care services described in this note is  33  Minutes. This time reflects time of care of this signee Dr Koren BoundWesam Yacoub. This critical care time does not reflect procedure time, or teaching time or supervisory time of PA/NP/Med student/Med Resident etc but could involve care discussion time.  Alyson ReedyWesam G. Yacoub, M.D. [facility] Pulmonary/Critical Care Medicine. Pager: [phone]. After hours pager: [phone].

## 2018-11-18 DIAGNOSIS — R569 Unspecified convulsions: Secondary | ICD-10-CM

## 2018-11-18 DIAGNOSIS — Z515 Encounter for palliative care: Secondary | ICD-10-CM

## 2018-11-18 DIAGNOSIS — Z978 Presence of other specified devices: Secondary | ICD-10-CM

## 2018-11-18 DIAGNOSIS — R6521 Severe sepsis with septic shock: Secondary | ICD-10-CM

## 2018-11-18 DIAGNOSIS — Z95828 Presence of other vascular implants and grafts: Secondary | ICD-10-CM

## 2018-11-18 DIAGNOSIS — Z91048 Other nonmedicinal substance allergy status: Secondary | ICD-10-CM

## 2018-11-18 DIAGNOSIS — Z8673 Personal history of transient ischemic attack (TIA), and cerebral infarction without residual deficits: Secondary | ICD-10-CM

## 2018-11-18 DIAGNOSIS — N179 Acute kidney failure, unspecified: Secondary | ICD-10-CM | POA: Diagnosis not present

## 2018-11-18 DIAGNOSIS — J9610 Chronic respiratory failure, unspecified whether with hypoxia or hypercapnia: Secondary | ICD-10-CM

## 2018-11-18 DIAGNOSIS — J189 Pneumonia, unspecified organism: Secondary | ICD-10-CM

## 2018-11-18 DIAGNOSIS — R509 Fever, unspecified: Secondary | ICD-10-CM | POA: Diagnosis not present

## 2018-11-18 DIAGNOSIS — Z930 Tracheostomy status: Secondary | ICD-10-CM

## 2018-11-18 DIAGNOSIS — Z931 Gastrostomy status: Secondary | ICD-10-CM

## 2018-11-18 DIAGNOSIS — A419 Sepsis, unspecified organism: Principal | ICD-10-CM

## 2018-11-18 DIAGNOSIS — J9600 Acute respiratory failure, unspecified whether with hypoxia or hypercapnia: Secondary | ICD-10-CM

## 2018-11-18 DIAGNOSIS — K148 Other diseases of tongue: Secondary | ICD-10-CM

## 2018-11-18 DIAGNOSIS — Z87898 Personal history of other specified conditions: Secondary | ICD-10-CM

## 2018-11-18 DIAGNOSIS — Z8619 Personal history of other infectious and parasitic diseases: Secondary | ICD-10-CM

## 2018-11-18 DIAGNOSIS — Z8701 Personal history of pneumonia (recurrent): Secondary | ICD-10-CM

## 2018-11-18 LAB — URINALYSIS, ROUTINE W REFLEX MICROSCOPIC
Bilirubin Urine: NEGATIVE
Glucose, UA: 50 mg/dL — AB
Ketones, ur: 5 mg/dL — AB
Nitrite: NEGATIVE
Protein, ur: 100 mg/dL — AB
Specific Gravity, Urine: 1.024 (ref 1.005–1.030)
WBC, UA: >50 WBC/hpf — ABNORMAL HIGH (ref 0–5)
pH: 5 (ref 5.0–8.0)

## 2018-11-18 LAB — CBC
HCT: 32.7 % — ABNORMAL LOW (ref 36.0–46.0)
Hemoglobin: 7.9 g/dL — ABNORMAL LOW (ref 12.0–15.0)
MCH: 25.2 pg — ABNORMAL LOW (ref 26.0–34.0)
MCHC: 24.2 g/dL — ABNORMAL LOW (ref 30.0–36.0)
MCV: 104.1 fL — ABNORMAL HIGH (ref 80.0–100.0)
Platelets: 227 10*3/uL (ref 150–400)
RBC: 3.14 MIL/uL — ABNORMAL LOW (ref 3.87–5.11)
RDW: 23.4 % — ABNORMAL HIGH (ref 11.5–15.5)
WBC: 23.8 10*3/uL — ABNORMAL HIGH (ref 4.0–10.5)
nRBC: 2.6 % — ABNORMAL HIGH (ref 0.0–0.2)

## 2018-11-18 LAB — CULTURE, RESPIRATORY W GRAM STAIN

## 2018-11-18 LAB — BASIC METABOLIC PANEL
Anion gap: 6 (ref 5–15)
Anion gap: 12 (ref 5–15)
BUN: 71 mg/dL — ABNORMAL HIGH (ref 6–20)
BUN: 85 mg/dL — ABNORMAL HIGH (ref 6–20)
CO2: 38 mmol/L — ABNORMAL HIGH (ref 22–32)
CO2: 35 mmol/L — ABNORMAL HIGH (ref 22–32)
Calcium: 8.6 mg/dL — ABNORMAL LOW (ref 8.9–10.3)
Calcium: 8.6 mg/dL — ABNORMAL LOW (ref 8.9–10.3)
Chloride: 97 mmol/L — ABNORMAL LOW (ref 98–111)
Chloride: 98 mmol/L (ref 98–111)
Creatinine, Ser: 1.46 mg/dL — ABNORMAL HIGH (ref 0.44–1.00)
Creatinine, Ser: 1.69 mg/dL — ABNORMAL HIGH (ref 0.44–1.00)
GFR calc Af Amer: 57 mL/min — ABNORMAL LOW (ref >60)
GFR calc Af Amer: 48 mL/min — ABNORMAL LOW (ref >60)
GFR calc non Af Amer: 50 mL/min — ABNORMAL LOW (ref >60)
GFR calc non Af Amer: 41 mL/min — ABNORMAL LOW (ref >60)
Glucose, Bld: 168 mg/dL — ABNORMAL HIGH (ref 70–99)
Glucose, Bld: 180 mg/dL — ABNORMAL HIGH (ref 70–99)
Potassium: 6.4 mmol/L (ref 3.5–5.1)
Potassium: 4.3 mmol/L (ref 3.5–5.1)
Sodium: 141 mmol/L (ref 135–145)
Sodium: 145 mmol/L (ref 135–145)

## 2018-11-18 LAB — DIFFERENTIAL
Abs Immature Granulocytes: 0.15 10*3/uL — ABNORMAL HIGH (ref 0.00–0.07)
Basophils Absolute: 0.1 10*3/uL (ref 0.0–0.1)
Basophils Relative: 0 %
Eosinophils Absolute: 0 10*3/uL (ref 0.0–0.5)
Eosinophils Relative: 0 %
Immature Granulocytes: 1 %
Lymphocytes Relative: 10 %
Lymphs Abs: 2.4 10*3/uL (ref 0.7–4.0)
Monocytes Absolute: 2.6 10*3/uL — ABNORMAL HIGH (ref 0.1–1.0)
Monocytes Relative: 11 %
Neutro Abs: 18.2 10*3/uL — ABNORMAL HIGH (ref 1.7–7.7)
Neutrophils Relative %: 78 %

## 2018-11-18 LAB — GLUCOSE, CAPILLARY
Glucose-Capillary: 146 mg/dL — ABNORMAL HIGH (ref 70–99)
Glucose-Capillary: 158 mg/dL — ABNORMAL HIGH (ref 70–99)
Glucose-Capillary: 117 mg/dL — ABNORMAL HIGH (ref 70–99)
Glucose-Capillary: 97 mg/dL (ref 70–99)
Glucose-Capillary: 142 mg/dL — ABNORMAL HIGH (ref 70–99)

## 2018-11-18 LAB — PHOSPHORUS: Phosphorus: 6.2 mg/dL — ABNORMAL HIGH (ref 2.5–4.6)

## 2018-11-18 LAB — MAGNESIUM: Magnesium: 2.5 mg/dL — ABNORMAL HIGH (ref 1.7–2.4)

## 2018-11-18 LAB — CULTURE, RESPIRATORY

## 2018-11-18 MED ORDER — SODIUM POLYSTYRENE SULFONATE 15 GM/60ML PO SUSP
60.0000 g | Freq: Once | ORAL | Status: AC
  Administered 2018-11-18: 60 g via ORAL
  Filled 2018-11-18: qty 240

## 2018-11-18 MED ORDER — METOCLOPRAMIDE HCL 5 MG/ML IJ SOLN
5.0000 mg | Freq: Three times a day (TID) | INTRAMUSCULAR | Status: DC
  Administered 2018-11-18 – 2018-11-19 (×5): 5 mg via INTRAVENOUS
  Filled 2018-11-18 (×6): qty 1

## 2018-11-18 MED ORDER — SODIUM POLYSTYRENE SULFONATE 15 GM/60ML PO SUSP
60.0000 g | Freq: Once | ORAL | Status: AC
  Administered 2018-11-18: 60 g via RECTAL
  Filled 2018-11-18: qty 240

## 2018-11-18 MED ORDER — SODIUM ZIRCONIUM CYCLOSILICATE 10 G PO PACK
10.0000 g | PACK | Freq: Once | ORAL | Status: DC
  Filled 2018-11-18: qty 1

## 2018-11-18 MED ORDER — DEXTROSE 5 % IV SOLN
1.2500 g | Freq: Three times a day (TID) | INTRAVENOUS | Status: DC
  Administered 2018-11-18 – 2018-11-19 (×3): 1.25 g via INTRAVENOUS
  Filled 2018-11-18 (×5): qty 6

## 2018-11-18 MED ORDER — FUROSEMIDE 10 MG/ML IJ SOLN
80.0000 mg | Freq: Four times a day (QID) | INTRAMUSCULAR | Status: AC
  Administered 2018-11-18 (×3): 80 mg via INTRAVENOUS
  Filled 2018-11-18 (×3): qty 8

## 2018-11-18 MED ORDER — SODIUM POLYSTYRENE SULFONATE 15 GM/60ML PO SUSP
60.0000 g | Freq: Once | ORAL | Status: DC
  Filled 2018-11-18: qty 240

## 2018-11-18 MED ORDER — MIDODRINE HCL 5 MG PO TABS
10.0000 mg | ORAL_TABLET | Freq: Three times a day (TID) | ORAL | Status: DC
  Administered 2018-11-18 – 2018-11-19 (×4): 10 mg via ORAL
  Filled 2018-11-18 (×5): qty 2

## 2018-11-18 NOTE — Consult Note (Signed)
Consultation Note Date: 11/18/2018   Patient Name: Kathy Buckley  DOB: May 10, 1991  MRN: [id number]  Age / Sex: 28 y.o., female  PCP: Crist Fat, MD Referring Physician: Coralyn Helling, MD  Reason for Consultation: Establishing goals of care  HPI/Patient Profile: 28 y.o. female admitted on 10/31/2018    28 year old female from [facility] admitted with recurrent pneumonia and seizures.  Patient has a history of anoxic encephalopathy with chronic tracheostomy, ventilator use, multidrug-resistant infections.  Patient had cardiac arrest after postpartum hemorrhage in 2019.  She has had anoxic encephalopathy.  Other past medical history also significant for hepatitis and idiopathic hypotension.  Patient remains admitted to critical medicine service.  She is also being followed by neurology and infectious disease specialists.  Patient remains unresponsive, has a tracheostomy, does not respond, has protruded tongue.  She has also required pressors in this hospitalization thus far.  A palliative medicine consultation has been requested for continuing goals of care discussions with the patient's family members.  Clinical Assessment and Goals of Care: Call placed and discussed with the patient's father. I introduced palliative medicine and goals of care discussions as follows:  Palliative medicine is specialized medical care for people living with serious illness. It focuses on providing relief from the symptoms and stress of a serious illness. The goal is to improve quality of life for both the patient and the family.  Goals of care: Broad aims of medical therapy in relation to the patient's values and preferences. Our aim is to provide medical care aimed at enabling patients to achieve the goals that matter most to them, given the circumstances of their particular medical situation and their constraints.   Please  note additional discussions below.   NEXT OF KIN  parents.    SUMMARY OF RECOMMENDATIONS    Call placed and introduced palliative medicine as an extra layer of support to the patient's father Mr Ninh as I was unable to reach Mrs Vlcek, the patient's mother by phone.   Discussed frankly and compassionately that inspite of current measures directed at maintaining/prolonging her life, Ms Miley remains critically ill with a high likelihood of not surviving this hospitalization.   Reviewed with him the current management, he did have a question about how the "pneumonia was being cleared." Other pertinent medical management such as patient requiring vasopressors and anti epileptics also explained to the patient's father over the phone.   Endorsed patient's father's wishes to continue to hope and pray for some degree of stabilization/recovery. Wished that that would happen, but clinical picture at this time is one of ongoing decline. He is aware of how critically ill the patient is. He is tearful. I was also able to hear the patient's 3 year old infant daughter in the background. I offered supportive words, presence, as much as is possible over the telephone.   PMT to continue to follow.    Code Status/Advance Care Planning:  Limited code    Symptom Management:    continue current mode of care.  Palliative Prophylaxis:   Delirium Protocol  Additional Recommendations (Limitations, Scope, Preferences):    Psycho-social/Spiritual:   Desire for further Chaplaincy support:yes  Additional Recommendations: Caregiving  Support/Resources  Prognosis:   Guarded   Discharge Planning: To Be Determined      Primary Diagnoses: Present on Admission: . VAP (ventilator-associated pneumonia) (HCC) . Anoxic brain damage (HCC) . ARDS (adult respiratory distress syndrome) (HCC) . Sepsis due to pneumonia (HCC) . Symptomatic anemia . Cardiac arrest (HCC) . Hypoalbuminemia   I have  reviewed the medical record, interviewed the patient and family, and examined the patient. The following aspects are pertinent.  Past Medical History:  Diagnosis Date  . Acute respiratory failure (HCC)   . Anoxic brain damage (HCC)   . Cardiac arrest (HCC)   . Central corneal ulcer, left eye   . Idiopathic hypotension   . Tachyarrhythmia   . Viral hepatitis    Social History   Socioeconomic History  . Marital status: Single    Spouse name: Not on file  . Number of children: Not on file  . Years of education: Not on file  . Highest education level: Not on file  Occupational History  . Not on file  Social Needs  . Financial resource strain: Not on file  . Food insecurity:    Worry: Not on file    Inability: Not on file  . Transportation needs:    Medical: Not on file    Non-medical: Not on file  Tobacco Use  . Smoking status: Unknown If Ever Smoked  Substance and Sexual Activity  . Alcohol use: Not Currently  . Drug use: Not Currently  . Sexual activity: Not Currently  Lifestyle  . Physical activity:    Days per week: Not on file    Minutes per session: Not on file  . Stress: Not on file  Relationships  . Social connections:    Talks on phone: Not on file    Gets together: Not on file    Attends religious service: Not on file    Active member of club or organization: Not on file    Attends meetings of clubs or organizations: Not on file    Relationship status: Not on file  Other Topics Concern  . Not on file  Social History Narrative  . Not on file   Family History  Problem Relation Age of Onset  . Hypertension Mother   . Hypertension Father    Scheduled Meds: . amiodarone  200 mg Oral Daily  . famotidine  20 mg Per Tube BID  . feeding supplement (OSMOLITE 1.2 CAL)  1,000 mL Per Tube Q24H  . feeding supplement (PRO-STAT SUGAR FREE 64)  30 mL Per Tube QID  . furosemide  80 mg Intravenous Q6H  . heparin  5,000 Units Subcutaneous Q8H  . insulin aspart   0-15 Units Subcutaneous Q4H  . mouth rinse  15 mL Mouth Rinse 10 times per day  . metoCLOPramide (REGLAN) injection  5 mg Intravenous Q8H  . midodrine  10 mg Oral TID WC  . scopolamine  1 patch Transdermal Q72H  . senna-docusate  2 tablet Oral BID  . sodium chloride flush  10-40 mL Intracatheter Q12H   Continuous Infusions: . sodium chloride 10 mL/hr at 11/18/18 1500  . ceftazidime avibactam (AVYCAZ) IVPB    . levETIRAcetam Stopped (11/18/18 1010)  . norepinephrine (LEVOPHED) Adult infusion 18 mcg/min (11/18/18 1500)   PRN Meds:.sodium chloride, acetaminophen, albuterol, artificial tears, LORazepam, sodium chloride  flush Medications Prior to Admission:  Prior to Admission medications   Not on File   Allergies  Allergen Reactions  . Chlorhexidine     Other reaction(s): Angioedema (ALLERGY/intolerance)  . Other     chlorahexidine   Review of Systems Unresponsive on vent Physical Exam Unresponsive on vent tongue edematous and protruded Has diffuse edema Has trach and PEG  Vital Signs: BP (!) 100/55   Pulse (!) 118   Temp 99.4 F (37.4 C) (Oral)   Resp (!) 0   Ht  (1.651 m)   Wt 85.3 kg   SpO2 92%   BMI 31.29 kg/m  Pain Scale: CPOT   Pain Score: (for fever)   SpO2: SpO2: 92 % O2 Device:SpO2: 92 % O2 Flow Rate: .   IO: Intake/output summary:   Intake/Output Summary (Last 24 hours) at 11/18/2018 1521 Last data filed at 11/18/2018 1500 Gross per 24 hour  Intake 2406.83 ml  Output 555 ml  Net 1851.83 ml    LBM: Last BM Date: 11/17/18 Baseline Weight: Weight: 87 kg Most recent weight: Weight: 85.3 kg     Palliative Assessment/Data:   Flowsheet Rows     Most Recent Value  Intake Tab  Referral Department  Critical care  Unit at Time of Referral  ICU  Palliative Care Primary Diagnosis  Pulmonary  Palliative Care Type  New Palliative care  Reason for referral  Clarify Goals of Care  Date first seen by Palliative Care  11/18/18  Clinical Assessment   Palliative Performance Scale Score  10%  Pain Max last 24 hours  4  Pain Min Last 24 hours  3  Dyspnea Max Last 24 Hours  4  Dyspnea Min Last 24 hours  3  Nausea Max Last 24 Hours  0  Nausea Min Last 24 Hours  0  Anxiety Max Last 24 Hours  2  Anxiety Min Last 24 Hours  1  Psychosocial & Spiritual Assessment  Palliative Care Outcomes  Patient/Family meeting held?  Yes  Who was at the meeting?  discussed with father over the phone.       Time In:  1400 Time Out:  1500 Time Total:  60 min  Greater than 50%  of this time was spent counseling and coordinating care related to the above assessment and plan.  Signed by: Rosalin Hawking, MD [phone]  Please contact Palliative Medicine Team phone at [phone] for questions and concerns.  For individual provider: See Loretha Stapler

## 2018-11-18 NOTE — Progress Notes (Signed)
PHARMACY NOTE:  ANTIMICROBIAL RENAL DOSAGE ADJUSTMENT  Current antimicrobial regimen includes a mismatch between antimicrobial dosage and estimated renal function.  As per policy approved by the Pharmacy & Therapeutics and Medical Executive Committees, the antimicrobial dosage will be adjusted accordingly.  Current antimicrobial dosage: Avycaz 2.5g IV Q8H  Indication: MDR Pseudomonas and KPC Klebsiella PNA  Renal Function:  Estimated Creatinine Clearance: 63.5 mL/min (A) (by C-G formula based on SCr of 1.46 mg/dL (H)). []      On intermittent HD, scheduled: []      On CRRT **Although CrCL reported as > 50 ml/min, it is likely not a true reflection of patient's renal function given her baseline SCr is ~0.3.    Antimicrobial dosage has been changed to:  Avycaz 1.25g IV Q8H   Verdella Laidlaw D. Laney Potash, PharmD, BCPS, BCCCP 11/18/2018, 2:01 PM

## 2018-11-18 NOTE — Consult Note (Signed)
[facility]    Date of Admission:  10/14/2018   Total days of antibiotics >25        Day 12 ceftazidime-avibactam (off 5/12)               Reason for Consult: Recurrent fever    Referring Provider: Lexine Baton  Assessment: She has multiple potential sources for her recurrent fever including persistent ventilator associated pneumonia, PICC associated bloodstream infection, UTI and hypersensitivity reaction.  I will obtain repeat blood and urine cultures now.  Her antibiotic options are rather limited given the recent multidrug resistant pathogens isolated.  I would consider adding vancomycin if she becomes more unstable overnight.   Dr. Kavin Leech note on 11/13/2018 mentions a "somewhat protruding tongue".  This appears to have gotten much worse since then.  I am not sure what is causing her macroglossia but would consider a hypersensitivity reaction that could also be contributing to fever.  I will check her for eosinophilia.  Plan: 1. Continue ceftazidime-avibactam 2. UA, urine and blood cultures 3. Add differential to today's CBC 4. We will follow with  Principal Problem:   Fever Active Problems:   Acquired macroglossia   Sepsis due to pneumonia (HCC)   VAP (ventilator-associated pneumonia) (HCC)   Acute respiratory failure with hypoxia (HCC)   Anoxic brain damage (HCC)   Symptomatic anemia   Hypoalbuminemia   ARDS (adult respiratory distress syndrome) (HCC)   Cardiac arrest (HCC)   Seizures (HCC)   Acute kidney injury (HCC)   Scheduled Meds: . amiodarone  200 mg Oral Daily  . famotidine  20 mg Per Tube BID  . feeding supplement (OSMOLITE 1.2 CAL)  1,000 mL Per Tube Q24H  . feeding supplement (PRO-STAT SUGAR FREE 64)  30 mL Per Tube QID  . furosemide  80 mg Intravenous Q6H  . heparin  5,000 Units Subcutaneous Q8H  . insulin aspart  0-15 Units Subcutaneous Q4H  . mouth rinse  15 mL Mouth Rinse 10 times per day  . metoCLOPramide  (REGLAN) injection  5 mg Intravenous Q8H  . midodrine  10 mg Oral TID WC  . scopolamine  1 patch Transdermal Q72H  . senna-docusate  2 tablet Oral BID  . sodium chloride flush  10-40 mL Intracatheter Q12H   Continuous Infusions: . sodium chloride 10 mL/hr at 11/18/18 0900  . ceftazidime avibactam (AVYCAZ) IVPB Stopped (11/18/18 0434)  . levETIRAcetam 1,000 mg (11/18/18 0954)  . norepinephrine (LEVOPHED) Adult infusion 18 mcg/min (11/18/18 0900)   PRN Meds:.sodium chloride, acetaminophen, albuterol, artificial tears, LORazepam, sodium chloride flush  HPI: Kathy Buckley is a 28 y.o. female who suffered severe postpartum hemorrhage, ischemic stroke etc. at the after delivering a baby at home a [place] over 1 year ago.  She has a tracheostomy and chronic respiratory failure.  History of pulmonary associated pneumonia with multidrug-resistant.  Was admitted here on 10/27/2018 with acute on chronic respiratory failure.  Sputum culture grew Achromobacter xylsoxidans.  Subsequent sputum cultures have grown stenotrophomonas and Pseudomonas and a carbapenemase resistant Klebsiella.  Started on ceftazidime-avibactam 11/07/2018.  It was stopped on 11/13/2018.  She started spiking intermittent fevers again the following day and it was restarted.  She has continued to have intermittent fevers to 101.5 degrees.   Review of Systems: Review of Systems  Unable to perform ROS: Mental acuity    Past Medical History:  Diagnosis Date  . Acute respiratory failure (HCC)   .  Anoxic brain damage (HCC)   . Cardiac arrest (HCC)   . Central corneal ulcer, left eye   . Idiopathic hypotension   . Tachyarrhythmia   . Viral hepatitis     Social History   Tobacco Use  . Smoking status: Unknown If Ever Smoked  Substance Use Topics  . Alcohol use: Not Currently  . Drug use: Not Currently    Family History  Problem Relation Age of Onset  . Hypertension Mother   . Hypertension Father    Allergies  Allergen  Reactions  . Chlorhexidine     Other reaction(s): Angioedema (ALLERGY/intolerance)  . Other     chlorahexidine    OBJECTIVE: Blood pressure 117/64, pulse (!) 123, temperature 99.4 F (37.4 C), temperature source Oral, resp. rate (!) 23, height 5\' 5"  (1.651 m), weight 85.3 kg, SpO2 100 %.  Physical Exam Constitutional:      Comments: She is unresponsive.  HENT:     Nose:     Comments: Her tongue is diffusely enlarged and protruding. Eyes:     Conjunctiva/sclera: Conjunctivae normal.  Cardiovascular:     Rate and Rhythm: Normal rate and regular rhythm.     Heart sounds: No murmur.     Comments: Distant heart sounds. Pulmonary:     Breath sounds: Normal breath sounds.     Comments: Tracheostomy. Abdominal:     Palpations: Abdomen is soft.     Comments: She has a left upper quadrant PEG tube in place.  No recent diarrhea.  Genitourinary:    Comments: Foley catheter in place. Musculoskeletal:        General: No swelling.     Comments: Generalized edema.  Skin:    Comments: Palmar erythema.  She has bilateral upper extremity PICCs in place.     Lab Results Lab Results  Component Value Date   WBC 23.8 (H) 11/18/2018   HGB 7.9 (L) 11/18/2018   HCT 32.7 (L) 11/18/2018   MCV 104.1 (H) 11/18/2018   PLT 227 11/18/2018    Lab Results  Component Value Date   CREATININE 1.46 (H) 11/18/2018   BUN 71 (H) 11/18/2018   NA 141 11/18/2018   K 6.4 (HH) 11/18/2018   CL 97 (L) 11/18/2018   CO2 38 (H) 11/18/2018    Lab Results  Component Value Date   ALT 30 11/16/2018   AST 34 11/16/2018   ALKPHOS 141 (H) 11/16/2018   BILITOT 0.2 (L) 11/16/2018     Microbiology: Recent Results (from the past 240 hour(s))  Culture, respiratory (non-expectorated)     Status: None   Collection Time: 11/12/18  4:00 AM  Result Value Ref Range Status   Specimen Description TRACHEAL ASPIRATE  Final   Special Requests NONE  Final   Gram Stain   Final    RARE WBC PRESENT, PREDOMINANTLY PMN  RARE GRAM POSITIVE RODS    Culture   Final    MODERATE PSEUDOMONAS AERUGINOSA MODERATE KLEBSIELLA PNEUMONIAE CONFIRMED CARBAPENEMASE RESISTANT ENTEROBACTERIACAE CRITICAL RESULT CALLED TO, READ BACK BY AND VERIFIED WITH: RN Augustin SchoolingDICKINSON, P. 1852 [phone] FCP Performed at [facility], [address] [place], [place] [address]    Report Status 11/18/2018 FINAL  Final   Organism ID, Bacteria PSEUDOMONAS AERUGINOSA  Final   Organism ID, Bacteria KLEBSIELLA PNEUMONIAE  Final      Susceptibility   Klebsiella pneumoniae - MIC*    AMPICILLIN >=32 RESISTANT Resistant     CEFAZOLIN >=64 RESISTANT Resistant     CEFEPIME  16 RESISTANT Resistant     CEFTAZIDIME >=64 RESISTANT Resistant     CEFTRIAXONE >=64 RESISTANT Resistant     CIPROFLOXACIN >=4 RESISTANT Resistant     GENTAMICIN 8 INTERMEDIATE Intermediate     IMIPENEM >=16 RESISTANT Resistant     TRIMETH/SULFA >=320 RESISTANT Resistant     AMPICILLIN/SULBACTAM >=32 RESISTANT Resistant     PIP/TAZO >=128 RESISTANT Resistant     Extended ESBL NEGATIVE Sensitive     * MODERATE KLEBSIELLA PNEUMONIAE   Pseudomonas aeruginosa - MIC*    CEFTAZIDIME INTERMEDIATE Intermediate     CIPROFLOXACIN >=4 RESISTANT Resistant     GENTAMICIN 8 INTERMEDIATE Intermediate     IMIPENEM >=16 RESISTANT Resistant     PIP/TAZO 32 SENSITIVE Sensitive     CEFEPIME 32 RESISTANT Resistant     * MODERATE PSEUDOMONAS AERUGINOSA  Carbapenem Resistance Panel     Status: Abnormal   Collection Time: 11/12/18  4:00 AM  Result Value Ref Range Status   Carba Resistance IMP Gene NOT DETECTED NOT DETECTED Final   Carba Resistance VIM Gene NOT DETECTED NOT DETECTED Final   Carba Resistance NDM Gene NOT DETECTED NOT DETECTED Final   Carba Resistance KPC Gene DETECTED (A) NOT DETECTED Final    Comment: CRITICAL RESULT CALLED TO, READ BACK BY AND VERIFIED WITH: RN DICKINSON, P. 1852 [id number] FCP    Carba Resistance OXA48 Gene NOT DETECTED NOT DETECTED Final    Comment:  (NOTE) Cepheid Carba-R is an FDA-cleared nucleic acid amplification test  (NAAT)for the detection and differentiation of genes encoding the  most prevalent carbapenemases in bacterial isolate samples. Carbapenemase gene identification and implementation of comprehensive  infection control measures are recommended by the CDC to prevent the  spread of the resistant organisms. Performed at [facility], [address], [place], [place] [address]     Cliffton Asters, MD [facility] [phone] pager   [phone] cell 11/18/2018, 12:03 PM

## 2018-11-18 NOTE — Progress Notes (Signed)
CRITICAL VALUE ALERT  Critical Value:  Potassium 6.4  Date & Time Notied:  11/18/2018   0092  Provider Notified: Pola Corn   Orders Received/Actions taken:

## 2018-11-18 NOTE — Progress Notes (Signed)
NAME:  Kathy Buckley, MRN:  [id number], DOB:  Oct 12, 1990, LOS: 24 ADMISSION DATE:  10/24/2018, CONSULTATION DATE: October 25, 2018 REFERRING MD: Dr. Juleen ChinaKohut, CHIEF COMPLAINT: Hypoxia  Brief History   28 yo female from [facility] with recurrent pneumonia and seizures.  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  Past Medical History  Cardiac arrest after post partum hemorrhage in 2019, Anoxic encephalopathy, Hepatitis, Idiopathic hypotension  Significant Hospital Events   4/19 admit to [facility] for PNA 4/20 Transfer to [facility] 4/23 Admit to [facility] for hypoxia 4/28 fever 101  Consults:  Neurology  Procedures:  4/25 change to portex 8 trach, bronch 5/02 fever, worsening hypoxia 5/05 recurrent seizure, neuro consulted 5/06 pressure control 5/14 restart antibiotics  Significant Diagnostic Tests:  EEG 4/28 >> severe voltage suppression MRI brain 5/05 >> severe diffuse cerebral atrophy with cortical thinning, marked diffuse ventriculomegaly EEG 5/13 >> GPD, occasional generalized epileptiform discharge, suppressed background  Micro Data:  4/19 trach aspirate > MDR Achromobacter.   4/19 COVID > negative 4/24 covid > negative 4/25 tracheal aspirate > 100,000 Stenotrophomonas (pan-S), 40 k pseudomonas (R imipenem, S zosyn) 5/02 resp cx > moderate Pseudomonas (R imipenem), rare Klebsiella (pan-R) 5/11 trach aspirate > Pseudomonas (s) zosyn  Antimicrobials:  Bactrim 4/26 >> 5/11 ceftax 4/29 >>5/6 Avycaz 5/6 >>   Interim history/subjective:  Remains completely unresponsive Worsening hypotension overnight requiring higher pressor demand  Objective   Blood pressure (!) 110/58, pulse (!) 126, temperature 99.6 F (37.6 C), temperature source Oral, resp. rate 17, height 5\' 5"  (1.651 m), weight 85.3 kg, SpO2 97 %.    Vent Mode: PCV FiO2 (%):  [80 %-100 %] 100 % Set Rate:  [18 bmp] 18 bmp PEEP:  [10 cmH20] 10 cmH20 Plateau Pressure:   [31 cmH20-37 cmH20] 37 cmH20   Intake/Output Summary (Last 24 hours) at 11/18/2018 0844 Last data filed at 11/18/2018 [id number] Gross per 24 hour  Intake 2313.16 ml  Output 230 ml  Net 2083.16 ml   Filed Weights   11/16/18 0453 11/17/18 0500 11/18/18 0254  Weight: 78.8 kg 80.5 kg 85.3 kg   Physical exam    General - Chronically ill appearing female, NAD, unresponsive HEENT: [place]/AT, PERRL, EOM-I and MMM, trach in place Cardiac - RRR, Nl S1/S2 and -M/R/G Chest - Coarse BS diffusely Abdomen - Soft, NT, ND and +BS Extremities - 2+ edema Skin - no rashes Neuro - Unresponsive  I reviewed CXR myself, trach is in a good position, mild pulmonary edema noted  Assessment & Plan:   Acute on chronic hypoxic, hypercapnic respiratory failure from recurrent PNA and pulmonary edema. Plan - Hold off any weaning efforts, full vent support - Titrate O2 for sat of 88-92% - If no improvement in AKI, UOP and hemodynamics by AM will contact family and recommend a palliative approach - CXR PRN at this point - Lasix 80 mg IV q6 x3 doses - Scopolamine for respiratory secretions  Septic shock from recurrent PNA with multidrug resistant bacteria. Discussion: Most recent culture with Pseudomonas. Plan - Day 4/14 of Avycaz (d/w ID on 5/14) - Levophed for MAP of 65 mmHg, up to 18 mcg - Increase midodrine to 10 mg TID - ID to re-evaluate  Recurrent seizures. Anoxic encephalopathy with comatose state. Plan - Continue AEDs - Ongoing d/w family about goals of care   Anemia of critical illness Plan - F/u CBC intermittently  Hyperkalemia. Plan - F/u BMET  Acute renal failure from  septic shock Plan: - Lasix ordered - Maintain BP - Do not believe will be a dialysis candidate but will re-evaluate in AM  Hyperglycemia. Plan - SSI  If continues to deteriorate then will consult palliative care on Monday  Best practice:  Diet: tube feed DVT prophylaxis: Heparin SQ GI prophylaxis: Pepcid  Mobility: bed rest Code Status: full  Labs:   CMP Latest Ref Rng & Units 11/18/2018 11/17/2018 11/16/2018  Glucose 70 - 99 mg/dL 732(K) 025(K) 270(W)  BUN 6 - 20 mg/dL 23(J) 62(G) 20  Creatinine 0.44 - 1.00 mg/dL 3.15(V) 7.61 6.07(P)  Sodium 135 - 145 mmol/L 141 139 137  Potassium 3.5 - 5.1 mmol/L 6.4(HH) 6.2(H) 5.9(H)  Chloride 98 - 111 mmol/L 97(L) 94(L) 91(L)  CO2 22 - 32 mmol/L 38(H) 38(H) 41(H)  Calcium 8.9 - 10.3 mg/dL 7.1(G) 9.3 9.3  Total Protein 6.5 - 8.1 g/dL - - 10.0(H)  Total Bilirubin 0.3 - 1.2 mg/dL - - 6.2(I)  Alkaline Phos 38 - 126 U/L - - 141(H)  AST 15 - 41 U/L - - 34  ALT 0 - 44 U/L - - 30   CBC Latest Ref Rng & Units 11/18/2018 11/17/2018 11/14/2018  WBC 4.0 - 10.5 K/uL 23.8(H) 21.1(H) -  Hemoglobin 12.0 - 15.0 g/dL 7.9(L) 8.6(L) 11.9(L)  Hematocrit 36.0 - 46.0 % 32.7(L) 34.8(L) 35.0(L)  Platelets 150 - 400 K/uL 227 270 -   ABG    Component Value Date/Time   PHART 7.397 11/14/2018 1121   PCO2ART 80.7 (HH) 11/14/2018 1121   PO2ART 68.0 (L) 11/14/2018 1121   HCO3 50.0 (H) 11/14/2018 1121   TCO2 >50 (H) 11/14/2018 1121   O2SAT 92.0 11/14/2018 1121   CBG (last 3)  Recent Labs    11/17/18 2351 11/18/18 0354 11/18/18 0725  GLUCAP 184* 146* 158*   The patient is critically ill with multiple organ systems failure and requires high complexity decision making for assessment and support, frequent evaluation and titration of therapies, application of advanced monitoring technologies and extensive interpretation of multiple databases.   Critical Care Time devoted to patient care services described in this note is  33  Minutes. This time reflects time of care of this signee Dr Koren Bound. This critical care time does not reflect procedure time, or teaching time or supervisory time of PA/NP/Med student/Med Resident etc but could involve care discussion time.  Alyson Reedy, M.D. [facility] Pulmonary/Critical Care Medicine. Pager: [phone]. After hours pager:  [phone].

## 2018-11-19 ENCOUNTER — Inpatient Hospital Stay (HOSPITAL_COMMUNITY): Payer: Medicaid Other

## 2018-11-19 DIAGNOSIS — Z7189 Other specified counseling: Secondary | ICD-10-CM

## 2018-11-19 DIAGNOSIS — Z515 Encounter for palliative care: Secondary | ICD-10-CM

## 2018-11-19 DIAGNOSIS — R140 Abdominal distension (gaseous): Secondary | ICD-10-CM

## 2018-11-19 LAB — BLOOD CULTURE ID PANEL (REFLEXED)

## 2018-11-19 LAB — BASIC METABOLIC PANEL
Anion gap: 11 (ref 5–15)
BUN: 94 mg/dL — ABNORMAL HIGH (ref 6–20)
CO2: 38 mmol/L — ABNORMAL HIGH (ref 22–32)
Calcium: 8.7 mg/dL — ABNORMAL LOW (ref 8.9–10.3)
Chloride: 99 mmol/L (ref 98–111)
Creatinine, Ser: 1.91 mg/dL — ABNORMAL HIGH (ref 0.44–1.00)
GFR calc Af Amer: 41 mL/min — ABNORMAL LOW (ref >60)
GFR calc non Af Amer: 36 mL/min — ABNORMAL LOW (ref >60)
Glucose, Bld: 203 mg/dL — ABNORMAL HIGH (ref 70–99)
Potassium: 3.9 mmol/L (ref 3.5–5.1)
Sodium: 148 mmol/L — ABNORMAL HIGH (ref 135–145)

## 2018-11-19 LAB — GLUCOSE, CAPILLARY
Glucose-Capillary: 162 mg/dL — ABNORMAL HIGH (ref 70–99)
Glucose-Capillary: 170 mg/dL — ABNORMAL HIGH (ref 70–99)
Glucose-Capillary: 185 mg/dL — ABNORMAL HIGH (ref 70–99)
Glucose-Capillary: 194 mg/dL — ABNORMAL HIGH (ref 70–99)

## 2018-11-19 LAB — PHOSPHORUS: Phosphorus: 8.2 mg/dL — ABNORMAL HIGH (ref 2.5–4.6)

## 2018-11-19 LAB — CBC
HCT: 30.5 % — ABNORMAL LOW (ref 36.0–46.0)
Hemoglobin: 7.5 g/dL — ABNORMAL LOW (ref 12.0–15.0)
MCH: 25.2 pg — ABNORMAL LOW (ref 26.0–34.0)
MCHC: 24.6 g/dL — ABNORMAL LOW (ref 30.0–36.0)
MCV: 102.3 fL — ABNORMAL HIGH (ref 80.0–100.0)
Platelets: 223 10*3/uL (ref 150–400)
RBC: 2.98 MIL/uL — ABNORMAL LOW (ref 3.87–5.11)
RDW: 24.7 % — ABNORMAL HIGH (ref 11.5–15.5)
WBC: 21.3 10*3/uL — ABNORMAL HIGH (ref 4.0–10.5)
nRBC: 2.1 % — ABNORMAL HIGH (ref 0.0–0.2)

## 2018-11-19 LAB — URINE CULTURE
Culture: NO GROWTH
Special Requests: NORMAL

## 2018-11-19 LAB — MAGNESIUM: Magnesium: 2.4 mg/dL (ref 1.7–2.4)

## 2018-11-19 MED ORDER — MORPHINE SULFATE (PF) 2 MG/ML IV SOLN
INTRAVENOUS | Status: AC
  Filled 2018-11-19: qty 1

## 2018-11-19 MED ORDER — ACETAMINOPHEN 650 MG RE SUPP: 650.0000 mg | Freq: Four times a day (QID) | RECTAL | Status: DC | PRN

## 2018-11-19 MED ORDER — GLYCOPYRROLATE 1 MG PO TABS: 1.0000 mg | ORAL_TABLET | ORAL | Status: DC | PRN

## 2018-11-19 MED ORDER — DEXTROSE 5 % IV SOLN: INTRAVENOUS | Status: DC

## 2018-11-19 MED ORDER — MORPHINE SULFATE (PF) 4 MG/ML IV SOLN
10.0000 mg | Freq: Once | INTRAVENOUS | Status: AC
  Administered 2018-11-19: 15:00:00 10 mg via INTRAVENOUS

## 2018-11-19 MED ORDER — GLYCOPYRROLATE 0.2 MG/ML IJ SOLN: 0.2000 mg | INTRAMUSCULAR | Status: DC | PRN

## 2018-11-19 MED ORDER — DIPHENHYDRAMINE HCL 50 MG/ML IJ SOLN: 25.0000 mg | INTRAMUSCULAR | Status: DC | PRN

## 2018-11-19 MED ORDER — POLYVINYL ALCOHOL 1.4 % OP SOLN
1.0000 [drp] | Freq: Four times a day (QID) | OPHTHALMIC | Status: DC | PRN
  Filled 2018-11-19: qty 15

## 2018-11-19 MED ORDER — MORPHINE BOLUS VIA INFUSION
5.0000 mg | INTRAVENOUS | Status: DC | PRN
  Filled 2018-11-19: qty 5

## 2018-11-19 MED ORDER — MORPHINE SULFATE (PF) 2 MG/ML IV SOLN: 2.0000 mg | INTRAVENOUS | Status: DC | PRN

## 2018-11-19 MED ORDER — MORPHINE SULFATE (PF) 10 MG/ML IV SOLN: 10.0000 mg | Freq: Once | INTRAVENOUS | Status: DC

## 2018-11-19 MED ORDER — MORPHINE 100MG IN NS 100ML (1MG/ML) PREMIX INFUSION
0.0000 mg/h | INTRAVENOUS | Status: DC
  Administered 2018-11-19: 16:00:00 10 mg/h via INTRAVENOUS
  Filled 2018-11-19 (×2): qty 100

## 2018-11-19 MED ORDER — ACETAMINOPHEN 325 MG PO TABS: 650.0000 mg | ORAL_TABLET | Freq: Four times a day (QID) | ORAL | Status: DC | PRN

## 2018-11-19 MED ORDER — MORPHINE SULFATE (PF) 4 MG/ML IV SOLN
INTRAVENOUS | Status: AC
  Filled 2018-11-19: qty 1

## 2018-11-21 LAB — CULTURE, BLOOD (ROUTINE X 2)

## 2018-11-23 LAB — CULTURE, BLOOD (ROUTINE X 2)
Culture: NO GROWTH
Special Requests: ADEQUATE

## 2018-11-29 ENCOUNTER — Telehealth: Payer: Self-pay

## 2018-11-29 NOTE — Telephone Encounter (Signed)
Received dc from [employer] @ [facility].   DC is an original copy that Doctor Agarwala signed.. DC will be taken to [facility] for signature.   On 11/30/2018 Received dc back from Doctor Molli Knock.  Health Dept called and said funeral home said they received the original and we could just shred this one.Marland KitchenMarland Kitchen

## 2018-12-03 NOTE — Progress Notes (Signed)
Patient ID: Kathy Buckley, female   DOB: [birth year], 28 y.o.   MRN: [id number]          [facility] for Infectious Disease    Date of Admission:  10/04/2018   Total days of antibiotics >26        Day 13  Ms. Belanger has decompensated further today and has become progressively hypoxic and hypotensive.  Blood cultures showed methicillin-resistant coag negative staph in one set which is a probable contaminant  Urine culture is negative.  Her macroglossia has improved but she has lip swelling.  Unfortunately, it is extremely unlikely she will survive this event.  I agree with transitioning to comfort measures only.        Cliffton Asters, MD [facility] for Infectious Disease [facility] [phone] pager   [phone] cell 12/14/2018, 3:26 PM

## 2018-12-03 NOTE — Progress Notes (Signed)
NAME:  Kathy Buckley, MRN:  [id number], DOB:  01-24-1991, LOS: 25 ADMISSION DATE:  10/19/2018, CONSULTATION DATE: October 25, 2018 REFERRING MD: Dr. Juleen ChinaKohut, CHIEF COMPLAINT: Hypoxia  Brief History   28 yo female from [facility] with recurrent pneumonia and seizures.  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  Past Medical History  Cardiac arrest after post partum hemorrhage in 2019, Anoxic encephalopathy, Hepatitis, Idiopathic hypotension  Significant Hospital Events   4/19 admit to [facility] for PNA 4/20 Transfer to [facility] 4/23 Admit to [facility] for hypoxia 4/28 fever 101  Consults:  Neurology  Procedures:  4/25 change to portex 8 trach, bronch 5/02 fever, worsening hypoxia 5/05 recurrent seizure, neuro consulted 5/06 pressure control 5/14 restart antibiotics  Significant Diagnostic Tests:  EEG 4/28 >> severe voltage suppression MRI brain 5/05 >> severe diffuse cerebral atrophy with cortical thinning, marked diffuse ventriculomegaly EEG 5/13 >> GPD, occasional generalized epileptiform discharge, suppressed background  Micro Data:  4/19 trach aspirate > MDR Achromobacter.   4/19 COVID > negative 4/24 covid > negative 4/25 tracheal aspirate > 100,000 Stenotrophomonas (pan-S), 40 k pseudomonas (R imipenem, S zosyn) 5/02 resp cx > moderate Pseudomonas (R imipenem), rare Klebsiella (pan-R) 5/11 trach aspirate > Pseudomonas (s) zosyn  Antimicrobials:  Bactrim 4/26 >> 5/11 ceftax 4/29 >>5/6 Avycaz 5/6 >>   Interim history/subjective:  Remains completely unresponsive Pressor demand increasing overnight UOP dropping dramatically overnight   Objective   Blood pressure (!) 100/50, pulse (!) 121, temperature 99.6 F (37.6 C), temperature source Oral, resp. rate (!) 29, height 5\' 5"  (1.651 m), weight 84.4 kg, SpO2 90 %.    Vent Mode: PCV FiO2 (%):  [100 %] 100 % Set Rate:  [14 bmp-18 bmp] 14 bmp PEEP:  [10 cmH20] 10 cmH20  Plateau Pressure:  [37 cmH20] 37 cmH20   Intake/Output Summary (Last 24 hours) at 2018-09-12 0911 Last data filed at 2018-09-12 0800 Gross per 24 hour  Intake 1565.59 ml  Output 575 ml  Net 990.59 ml   Filed Weights   11/17/18 0500 11/18/18 0254 2018/11/10 0439  Weight: 80.5 kg 85.3 kg 84.4 kg   Physical exam    General - Chronically ill appearing female, unresponsive HEENT: [place]/AT, PERRL, EOM-I and MMM, trach in place Cardiac - RRR, Nl S1/S2 and -M/R/G Chest - Coarse BS diffusely Abdomen - Soft, distended, NT and +BS Extremities - 2+ edema Skin - no rashes Neuro - Unresponsive  I reviewed CXR myself, pulmonary edema noted, trach in place  Assessment & Plan:   Acute on chronic hypoxic, hypercapnic respiratory failure from recurrent PNA and pulmonary edema. Plan - Full vent support - Spoke with mother, recommend withdrawal at this point and transition to comfort care given no improvement in oxygenation, hemodynamics and now renal failure. - Hold O2 and PEEP titration - CXR PRN at this point - Hold further diureses given hemodynamics - Scopolamine for respiratory secretions  Septic shock from recurrent PNA with multidrug resistant bacteria. Discussion: Most recent culture with Pseudomonas. Plan - Day 5/14 of Avycaz (d/w ID on 5/14) - Levophed for MAP of 65 mmHg, up to 25 mcg, will recommend withdrawal, spoke with mother, she asked for her to speak to her father then will call us back - Continue midodrine to 10 mg TID for now - ID to re-evaluate  Recurrent seizures. Anoxic encephalopathy with comatose state. Plan - Continue AEDs - Ongoing d/w family about goals of care   Anemia of critical illness  Plan - F/u CBC intermittently  Hyperkalemia. Plan - F/u BMET - Replace electrolytes as indicated  Acute renal failure from septic shock Plan: - Hold further lasix - Maintain BP  Hyperglycemia. Plan - SSI  Palliative care consult appreciated, mother contacted by  me, informed that we recommend a palliative approach as patient will not survive this.  She will speak to her husband and call us back.  Best practice:  Diet: tube feed DVT prophylaxis: Heparin SQ GI prophylaxis: Pepcid Mobility: bed rest Code Status: full  Labs:   CMP Latest Ref Rng & Units 12/10/2018 11/18/2018 11/18/2018  Glucose 70 - 99 mg/dL 270(W) 237(S) 283(T)  BUN 6 - 20 mg/dL 51(V) 61(Y) 07(P)  Creatinine 0.44 - 1.00 mg/dL 7.10(G) 2.69(S) 8.54(O)  Sodium 135 - 145 mmol/L 148(H) 145 141  Potassium 3.5 - 5.1 mmol/L 3.9 4.3 6.4(HH)  Chloride 98 - 111 mmol/L 99 98 97(L)  CO2 22 - 32 mmol/L 38(H) 35(H) 38(H)  Calcium 8.9 - 10.3 mg/dL 2.7(O) 3.5(K) 0.9(F)  Total Protein 6.5 - 8.1 g/dL - - -  Total Bilirubin 0.3 - 1.2 mg/dL - - -  Alkaline Phos 38 - 126 U/L - - -  AST 15 - 41 U/L - - -  ALT 0 - 44 U/L - - -   CBC Latest Ref Rng & Units 12/10/2018 11/18/2018 11/17/2018  WBC 4.0 - 10.5 K/uL 21.3(H) 23.8(H) 21.1(H)  Hemoglobin 12.0 - 15.0 g/dL 7.5(L) 7.9(L) 8.6(L)  Hematocrit 36.0 - 46.0 % 30.5(L) 32.7(L) 34.8(L)  Platelets 150 - 400 K/uL 223 227 270   ABG    Component Value Date/Time   PHART 7.397 11/14/2018 1121   PCO2ART 80.7 (HH) 11/14/2018 1121   PO2ART 68.0 (L) 11/14/2018 1121   HCO3 50.0 (H) 11/14/2018 1121   TCO2 >50 (H) 11/14/2018 1121   O2SAT 92.0 11/14/2018 1121   CBG (last 3)  Recent Labs    11/18/18 2358 10-Dec-2018 0405 10-Dec-2018 0728  GLUCAP 170* 162* 185*   The patient is critically ill with multiple organ systems failure and requires high complexity decision making for assessment and support, frequent evaluation and titration of therapies, application of advanced monitoring technologies and extensive interpretation of multiple databases.   Critical Care Time devoted to patient care services described in this note is  45  Minutes. This time reflects time of care of this signee Dr Koren Bound. This critical care time does not reflect procedure time, or  teaching time or supervisory time of PA/NP/Med student/Med Resident etc but could involve care discussion time.  Alyson Reedy, M.D. [facility] Pulmonary/Critical Care Medicine. Pager: [phone]. After hours pager: [phone].

## 2018-12-03 NOTE — Progress Notes (Signed)
Pt expired peacefully at 1540. Family had just video called in. Mother is getting called now to be informed of pt's death. Morphine gtt turned off upon pronunciation. Autumn, RN, and [place], Charity fundraiser, pronounced death.

## 2018-12-03 NOTE — Progress Notes (Signed)
Daily Progress Note   Patient Name: Kathy Buckley       Date: 12/19/2018 DOB: [birth year]  Age: 28 y.o. MRN#: [id number] Attending Physician: Alyson Reedy, MD Primary Care Physician: Leonia Reader, Barbara Cower, MD Admit Date: 10/30/2018  Reason for Consultation/Follow-up: Establishing goals of care  Subjective:   patient remains unresponsive, her eyes are mid open, with tongue protruded, she doesn't respond to pain. Markedly minimal urine output.   Length of Stay: 25  Current Medications: Scheduled Meds:  . amiodarone  200 mg Oral Daily  . famotidine  20 mg Per Tube BID  . feeding supplement (OSMOLITE 1.2 CAL)  1,000 mL Per Tube Q24H  . feeding supplement (PRO-STAT SUGAR FREE 64)  30 mL Per Tube QID  . heparin  5,000 Units Subcutaneous Q8H  . insulin aspart  0-15 Units Subcutaneous Q4H  . mouth rinse  15 mL Mouth Rinse 10 times per day  . metoCLOPramide (REGLAN) injection  5 mg Intravenous Q8H  . midodrine  10 mg Oral TID WC  . scopolamine  1 patch Transdermal Q72H  . senna-docusate  2 tablet Oral BID  . sodium chloride flush  10-40 mL Intracatheter Q12H    Continuous Infusions: . sodium chloride Stopped (12-19-2018 0540)  . ceftazidime avibactam (AVYCAZ) IVPB Stopped (12-19-2018 0740)  . levETIRAcetam 400 mL/hr at 12/19/18 1000  . norepinephrine (LEVOPHED) Adult infusion 25 mcg/min (2018/12/19 1000)    PRN Meds: sodium chloride, acetaminophen, albuterol, artificial tears, LORazepam, sodium chloride flush  Physical Exam         Chronically ill appearing Unresponsive Has trach Has PEG Coarse breath sounds Abdomen is distended She has edema Vital Signs: BP (!) 100/46   Pulse (!) 117   Temp 99.7 F (37.6 C) (Oral)   Resp (!) 22   Ht 5\' 5"  (1.651 m)   Wt 84.4 kg   SpO2 (!) 89%    BMI 30.96 kg/m  SpO2: SpO2: (!) 89 % O2 Device: O2 Device: Ventilator O2 Flow Rate:    Intake/output summary:   Intake/Output Summary (Last 24 hours) at 2018/12/19 1155 Last data filed at December 19, 2018 1000 Gross per 24 hour  Intake 1705.72 ml  Output 600 ml  Net 1105.72 ml   LBM: Last BM Date: (rectal pouch) Baseline Weight: Weight: 87 kg Most recent weight: Weight:  84.4 kg       Palliative Assessment/Data:    Flowsheet Rows     Most Recent Value  Intake Tab  Referral Department  Critical care  Unit at Time of Referral  ICU  Palliative Care Primary Diagnosis  Pulmonary  Date Notified  11/18/18  Palliative Care Type  New Palliative care  Reason for referral  Clarify Goals of Care  Date of Admission  08-09-2018  Date first seen by Palliative Care  11/18/18  # of days Palliative referral response time  0 Day(s)  # of days IP prior to Palliative referral  24  Clinical Assessment  Palliative Performance Scale Score  10%  Pain Max last 24 hours  4  Pain Min Last 24 hours  3  Dyspnea Max Last 24 Hours  4  Dyspnea Min Last 24 hours  3  Nausea Max Last 24 Hours  0  Nausea Min Last 24 Hours  0  Anxiety Max Last 24 Hours  2  Anxiety Min Last 24 Hours  1  Psychosocial & Spiritual Assessment  Palliative Care Outcomes  Patient/Family meeting held?  Yes  Who was at the meeting?  discussed with father over the phone.       Patient Active Problem List   Diagnosis Date Noted  . Seizures (HCC) 11/18/2018  . Acute kidney injury (HCC) 11/18/2018  . Fever 11/18/2018  . 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 Acute respiratory failure (HCC)   . Endotracheal tube present   . Palliative care by specialist   . Goals of care, counseling/discussion   . Cardiac arrest (HCC)   . ARDS (adult respiratory distress syndrome) (HCC)   . VAP (ventilator-associated pneumonia) (HCC) 02-06-202020  . Sepsis due to pneumonia (HCC) 10/21/2018  . Acute respiratory failure with hypoxia (HCC) 10/21/2018   . Anoxic brain damage (HCC) 10/21/2018  . Symptomatic anemia 10/21/2018  . Hypoalbuminemia 10/21/2018    Palliative Care Assessment & Plan   Patient Profile:    Assessment: Acute on chronic hypoxic, hypercapnic respiratory failure from recurrent PNA and pulmonary edema  Septic shock from recurrent PNA with multidrug resistant bacteria. Recurrent seizures. Anoxic encephalopathy with comatose state. AKI History of cardiac arrest after post partum hemorrhage in 2019, Anoxic encephalopathy, Hepatitis, Idiopathic hypotension.  Recommendations/Plan:   patient seen and examined, discussed with PCCM MD Dr Molli KnockYacoub. Agree that patient is rapidly declining with minimal to nil chances of a meaningful recovery. This has been conveyed to patient's next of kin, medical decision makers: her parents. Await their decision, PMT to remain available to support the patient and her family, assist with comfort measures once established.    Code Status:    Code Status Orders  (From admission, onward)         Start     Ordered   11/16/18 1459  Limited resuscitation (code)  Continuous    Question Answer Comment  In the event of cardiac or respiratory ARREST: Initiate Code Blue, Call Rapid Response No   In the event of cardiac or respiratory ARREST: Perform CPR No   In the event of cardiac or respiratory ARREST: Perform Intubation/Mechanical Ventilation Yes   In the event of cardiac or respiratory ARREST: Use NIPPV/BiPAp only if indicated No   In the event of cardiac or respiratory ARREST: Administer ACLS medications if indicated No   In the event of cardiac or respiratory ARREST: Perform Defibrillation or Cardioversion if indicated No      11/16/18 1458  Code Status History    Date Active Date Inactive Code Status Order ID Comments User Context   10/26/2018 1943 11/16/2018 1458 Full Code [id number]  Duayne Cal, NP ED   10/21/2018 2112 10/22/2018 2101 Full Code [id number]  Marcelle Smiling,  MD ED       Prognosis:   guarded  Discharge Planning:  Anticipated Hospital Death is highly likely.   Care plan was discussed with  Patient's PCCM MD. Discussed with patient's father on the phone on 11-18-2018.   Thank you for allowing the Palliative Medicine Team to assist in the care of this patient.   Time In: 11 Time Out: 11.25 Total Time 25 Prolonged Time Billed  no       Greater than 50%  of this time was spent counseling and coordinating care related to the above assessment and plan.  Rosalin Hawking, MD [phone] Please contact Palliative Medicine Team phone at [phone] for questions and concerns.

## 2018-12-03 NOTE — Progress Notes (Signed)
Called by bedside RN.  Patient is decompensating from a BP and saturation standpoint.  Lungs are coarse diffusely.  Saturation down to 35-40% and not improving regardless of vent adjustment.  I called mother again and informed her that the patient is decompensating rapidly and will not survive long enough for her and her husband to come here and see her.  We facilitated that they see her on video conferencing.  After discussion, will give morphine, make a full DNR and start decreasing pressors to off but focus more on comfort.  They were agreeable with that.  Give 10 mg of IV morphine x1 then start a drip and titrate levophed down.  The patient is critically ill with multiple organ systems failure and requires high complexity decision making for assessment and support, frequent evaluation and titration of therapies, application of advanced monitoring technologies and extensive interpretation of multiple databases.   Critical Care Time devoted to patient care services described in this note is  45  Minutes. This time reflects time of care of this signee Dr Koren Bound. This critical care time does not reflect procedure time, or teaching time or supervisory time of PA/NP/Med student/Med Resident etc but could involve care discussion time.  Alyson Reedy, M.D. [facility] Pulmonary/Critical Care Medicine. Pager: [phone]. After hours pager: [phone].

## 2018-12-03 NOTE — Progress Notes (Signed)
   11/06/2018 1210  Clinical Encounter Type  Visited With Patient  Visit Type Spiritual support;Initial  Referral From Palliative care team  Consult/Referral To Chaplain  Spiritual Encounters  Spiritual Needs Prayer  This chaplain responded to PMT referral for spiritual care for the Pt. and family.  The chaplain read the chart and received an update from the Pt. RN-Cori.  The chaplain was pastorally present with the Pt. at this visit.  The chaplain is available for F/U spiritual care as needed.

## 2018-12-03 NOTE — Progress Notes (Signed)
Assisted tele visit to patient with mother and father. Video provided to allow for mother and father to say their goodbyes to the patient prior to passing. Redmond Pulling, RN

## 2018-12-03 NOTE — Progress Notes (Signed)
Pt expired at 1540. Pts cuffed trach was deflated and ventilator was turned off. Janina Mayo was not removed at this time.

## 2018-12-03 NOTE — Death Summary Note (Signed)
DEATH SUMMARY   Patient Details  Name: Kathy Buckley MRN: [id number] DOB: Dec 06, 1990  Admission/Discharge Information   Admit Date:  2018/11/04  Date of Death: Date of Death: Nov 29, 2018  Time of Death: Time of Death: 1540  Length of Stay: December 06, 2022  Referring Physician: Leonia Reader, Barbara Cower, MD   Reason(s) for Hospitalization  Hospital acquired pneumonia  Diagnoses  Preliminary cause of death:   Acute on chronic hypoxemic respiratory failure  Secondary Diagnoses (including complications and co-morbidities):  Principal Problem:   Fever Active Problems:   Sepsis due to pneumonia (HCC)   Acute respiratory failure with hypoxia (HCC)   Anoxic brain damage (HCC)   Symptomatic anemia   Hypoalbuminemia   VAP (ventilator-associated pneumonia) (HCC)   ARDS (adult respiratory distress syndrome) (HCC)   Cardiac arrest (HCC)   Seizures (HCC)   Acute kidney injury (HCC)   Acquired macroglossia   Acute respiratory failure (HCC)   Endotracheal tube present   Palliative care by specialist   Palliative care encounter   Abdominal distention   Brief Hospital Course (including significant findings, care, treatment, and services provided and events leading to death)  28 yo female from [facility] with recurrent pneumonia and seizures.  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  Developed worsening ARDS and worsening respiratory failure.   Palliative care consult appreciated, mother contacted by me, informed that we recommend a palliative approach as patient will not survive this.  She will speak to her husband and call us back.  Called by bedside RN.  Patient is decompensating from a BP and saturation standpoint.  Lungs are coarse diffusely.  Saturation down to 35-40% and not improving regardless of vent adjustment.  I called mother again and informed her that the patient is decompensating rapidly and will not survive long enough for her and her  husband to come here and see her.  We facilitated that they see her on video conferencing.  After discussion, will give morphine, make a full DNR and start decreasing pressors to off but focus more on comfort.  They were agreeable with that.  Give 10 mg of IV morphine x1 then start a drip and titrate levophed down.  Patient dropped BP and desaturated to expire shortly thereafter.   Pertinent Labs and Studies  Significant Diagnostic Studies Dg Abd 1 View  Result Date: 11/01/2018 CLINICAL DATA:  Ileus. EXAM: ABDOMEN - 1 VIEW COMPARISON:  Radiographs of August 20, 2009. FINDINGS: No abnormal bowel-gas pattern is noted. Interval placement of gastrostomy tube which appears to be in grossly good position. No abnormal calcifications are noted. IMPRESSION: Interval placement of gastrostomy tube in grossly good position. No definite evidence of bowel obstruction or ileus. Electronically Signed   By: Lupita Raider M.D.   On: 11/01/2018 14:21   Mr Brain Wo Contrast  Result Date: 11/06/2018 CLINICAL DATA:  Initial evaluation for altered mental status, history of previous anoxic brain injury. EXAM: MRI HEAD WITHOUT CONTRAST TECHNIQUE: Multiplanar, multiecho pulse sequences of the brain and surrounding structures were obtained without intravenous contrast. COMPARISON:  None available. FINDINGS: Brain: Severe diffuse atrophy with loss of brain parenchyma seen throughout the bilateral cerebral and cerebellar hemispheres. Associated diffuse cortical thinning with diffuse signal abnormality throughout the underlying cerebral white matter. Superimposed multifocal areas of cystic encephalomalacia seen involving the bilateral lentiform nuclei, thalami, anterior temporal horns, and bilateral cerebellar hemispheres. Patchy involvement of the brainstem. Associated market ventriculomegaly, most likely related to severe parenchymal volume loss.  Overall, constellation of findings likely related to history of prior anoxic  brain injury. She No abnormal foci of restricted diffusion to suggest superimposed acute or subacute ischemia. No foci of susceptibility artifact to suggest acute or chronic intracranial hemorrhage. No mass lesion, mass effect, or midline shift. No extra-axial fluid collection. Pituitary gland within normal limits. Midline structures intact. Vascular: Major intracranial vascular flow voids maintained. Skull and upper cervical spine: Craniocervical junction within normal limits. Upper cervical spine normal. Bone marrow signal intensity diffusely decreased on T1 weighted imaging, most commonly related to anemia, smoking, or obesity. No focal marrow replacing lesion. Scalp soft tissues demonstrate no acute finding. Sinuses/Orbits: Globes and orbital soft tissues demonstrate no acute abnormality. Mild chronic mucosal thickening within the ethmoidal air cells and maxillary sinuses. Bilateral mastoid effusions noted. Inner ear structures grossly normal. Other: None. IMPRESSION: 1. Severe diffuse cerebral atrophy with associated cortical thinning and superimposed areas of multifocal cystic encephalomalacia as above. Findings most likely related to history of severe anoxic brain injury. 2. Marked diffuse ventriculomegaly related to diffuse parenchymal volume loss without hydrocephalus. 3. No superimposed acute intracranial abnormality. 4. Bilateral mastoid effusions. Electronically Signed   By: Rise Mu M.D.   On: 11/06/2018 15:58   Dg Chest Port 1 View  Result Date: 11/17/2018 CLINICAL DATA:  Respiratory failure EXAM: PORTABLE CHEST 1 VIEW COMPARISON:  11/15/2018 FINDINGS: Tracheostomy in satisfactory position. Left arm PICC terminates cavoatrial junction. Multifocal patchy opacities, left upper lobe and right lower lobe predominant, mildly progressed. Suspected right pleural effusion, increased. No pneumothorax. The heart is normal in size. IMPRESSION: Multifocal pneumonia, mildly progressive, as above.  Electronically Signed   By: Charline Bills M.D.   On: 11/17/2018 06:20   Dg Chest Port 1 View  Result Date: 11/15/2018 CLINICAL DATA:  Respiratory failure. EXAM: PORTABLE CHEST 1 VIEW COMPARISON:  11/14/2018 FINDINGS: The heart is enlarged. BILATERAL pulmonary opacities are increased, most prominent at the RIGHT base. Worsening pneumonia is likely. Tracheostomy good position. PICC line tip cavoatrial junction. IMPRESSION: Worsening aeration, likely progression of pneumonia. Electronically Signed   By: Elsie Stain M.D.   On: 11/15/2018 07:48   Dg Chest Port 1 View  Result Date: 11/14/2018 CLINICAL DATA:  28 year old female with history of anoxic brain injury. Respiratory failure. Negative for COVID-19 in April. EXAM: PORTABLE CHEST 1 VIEW COMPARISON:  11/12/2018 and earlier. FINDINGS: Portable AP semi upright view at 0511 hours. Mildly rotated to the right. Stable tracheostomy tube and left PICC line. Stable lung volumes and mediastinal contours. Continued coarse basilar and mid lung predominant pulmonary interstitial opacity with patchy areas of confluence bilaterally. Ventilation has not significantly changed since 11/09/2018 but remains improved compared to 10/31/2018. No pneumothorax or pleural effusion. IMPRESSION: 1. Stable lines and tubes. 2. Stable ventilation since 11/09/2018. Coarse bilateral pulmonary interstitial opacity with patchy areas of confluence. Electronically Signed   By: Odessa Fleming M.D.   On: 11/14/2018 07:50   Dg Chest Port 1 View  Result Date: 11/12/2018 CLINICAL DATA:  28 year old female with history of anoxic brain injury. Respiratory failure. EXAM: PORTABLE CHEST 1 VIEW COMPARISON:  Portable chest 11/09/2018 and earlier. FINDINGS: Portable AP semi upright view at 0347 hours. Stable tracheostomy. Left upper extremity approach PICC line appears stable. The patient is mildly rotated to the right. Mediastinal contours are within normal limits. Patchy and coarse multifocal  bilateral pulmonary opacity is stable, and mildly regressed in both lungs since 12/01/2018. Large underlying lung volumes with no pneumothorax, pleural effusion or pulmonary edema.  No areas of worsening ventilation. No acute osseous abnormality identified. IMPRESSION: 1. Patchy and coarse multifocal bilateral pulmonary opacities are stable, with mildly improved ventilation in both lungs since 12/01/18. 2. No new cardiopulmonary abnormality. Electronically Signed   By: Odessa Fleming M.D.   On: 11/12/2018 05:28   Dg Chest Port 1 View  Result Date: 11/09/2018 CLINICAL DATA:  Respiratory distress EXAM: PORTABLE CHEST 1 VIEW COMPARISON:  11/08/2018 FINDINGS: Cardiac shadows within normal limits. Patchy infiltrates are noted bilaterally left greater than right but slightly improved when compared with the prior exam. Cavitary lesion remains in the left base. Tracheostomy tube is noted in satisfactory position. No bony abnormality is noted. IMPRESSION: Bilateral infiltrates somewhat improved when compared with the prior exam with cavitary changes on the left. Electronically Signed   By: Alcide Clever M.D.   On: 11/09/2018 23:00   Dg Chest Port 1 View  Result Date: 11/08/2018 CLINICAL DATA:  Acute respiratory failure EXAM: PORTABLE CHEST 1 VIEW COMPARISON:  Four days ago FINDINGS: Tracheostomy tube in place. Left upper extremity PICC with tip at the upper cavoatrial junction. Bilateral airspace disease with reticulonodular appearance. A cavitary lesion is seen the lower left chest. Dextrocardia. Probable pleural fluid on both sides. No pneumothorax. IMPRESSION: Bilateral pneumonia with cavitary features on the left. Electronically Signed   By: Marnee Spring M.D.   On: 11/08/2018 08:27   Dg Chest Port 1 View  Result Date: 11/04/2018 CLINICAL DATA:  ARDS. EXAM: PORTABLE CHEST 1 VIEW COMPARISON:  Chest x-ray dated Nov 02, 2018. FINDINGS: Unchanged tracheostomy tube and left upper extremity PICC line. Stable cardiomediastinal  silhouette with slight rightward shift. Bilateral airspace disease has mildly improved at the left lung base, but coarsened at the right lung base where there is more dense consolidation. Possible new small right pleural effusion. No pneumothorax. No acute osseous abnormality. IMPRESSION: 1. Bilateral airspace disease has slightly improved at the left lung base, but worsened at the right lung base, and remains consistent with reported clinical history of ARDS. Electronically Signed   By: Obie Dredge M.D.   On: 11/04/2018 05:50   Dg Chest Port 1 View  Result Date: 11/02/2018 CLINICAL DATA:  Respiratory failure. EXAM: PORTABLE CHEST 1 VIEW COMPARISON:  10/31/2018 FINDINGS: Stable appearance of tracheostomy. Stable positioning of PICC line. Stable shift of the heart and mediastinum towards the right potentially due to some persistent volume loss in the right lower lung. Degree airspace disease in both lungs is relatively stable with some slight improvement in the left upper lobe and potentially slight worsening in the left lower lung. No pneumothorax or significant pleural effusions. IMPRESSION: Relatively stable bilateral pulmonary airspace disease with slight improvement in aeration of the left upper lobe and potentially slight increase in airspace disease in the left lower lung. Electronically Signed   By: Irish Lack M.D.   On: 11/02/2018 10:46   Dg Chest Port 1 View  Result Date: 10/31/2018 CLINICAL DATA:  Acute respiratory failure. EXAM: PORTABLE CHEST 1 VIEW COMPARISON:  Radiograph of October 27, 2018. FINDINGS: Stable cardiomediastinal silhouette. Tracheostomy tube is in grossly good position. Left-sided PICC line is unchanged. No pneumothorax is noted. Stable left upper lobe airspace opacity is noted consistent with pneumonia. Stable right basilar opacity is noted concerning for infiltrate or atelectasis with associated pleural effusion. Bony thorax is unremarkable. IMPRESSION: Stable support  apparatus. Stable bilateral lung opacities as described above. Electronically Signed   By: Lupita Raider M.D.   On: 10/31/2018 07:32  Dg Chest Port 1 View  Result Date: 10/27/2018 CLINICAL DATA:  Respiratory failure. EXAM: PORTABLE CHEST 1 VIEW COMPARISON:  October 26, 2018 FINDINGS: The tracheostomy tube remains. The left PICC line terminates in the central SVC. The right PICC line is been removed. No pneumothorax. Effusion with underlying opacity in the right base persists. Confluent opacity remains in the left mid lung with improvement of the left basilar opacity. No other changes. IMPRESSION: 1. Support apparatus as above. 2. Persistent confluent infiltrate in the left mid lung with improved infiltrate in left base. 3. Right-sided pleural effusion with underlying opacity is similar in the interval. Electronically Signed   By: Gerome Sam III M.D   On: 10/27/2018 05:38   Dg Chest Port 1 View  Result Date: 10/26/2018 CLINICAL DATA:  28 year old female with pneumonia. Negative for COVID-19. EXAM: PORTABLE CHEST 1 VIEW COMPARISON:  11-05-18 and earlier. FINDINGS: Portable AP semi upright view at 0411 hours. Stable tracheostomy tube and right PICC line. Bilateral mid and lower lung predominant widespread Patchy and confluent pulmonary opacity persists. Superimposed bilateral lower lobe atelectasis suspected. Ventilation has not significantly changed since 10/21/2018. No pneumothorax. Stable cardiac size and mediastinal contours. IMPRESSION: 1. Stable lines and tubes. 2. Ventilation not significantly changed since 10/21/2018. Bilateral mid and lower lung predominant patchy and confluent pulmonary opacity. Electronically Signed   By: Odessa Fleming M.D.   On: 10/26/2018 07:12   Dg Chest Portable 1 View  Result Date: Nov 05, 2018 CLINICAL DATA:  Increased need for ventilator support EXAM: PORTABLE CHEST 1 VIEW COMPARISON:  10/22/2018, 10/21/2018, 10/26/2017 FINDINGS: Tracheostomy tube is in place. Right upper  extremity catheter tip over the cavoatrial region. Extensive interstitial and alveolar infiltrates bilaterally, with slight progression of consolidation in the left perihilar lung. Stable cardiomediastinal silhouette. No pneumothorax. IMPRESSION: Persistent extensive bilateral interstitial and alveolar airspace disease with slight worsening of consolidation in the left mid lung. Electronically Signed   By: Jasmine Pang M.D.   On: 05-Nov-2018 18:49   Dg Chest Port 1 View  Result Date: 10/22/2018 CLINICAL DATA:  Acute respiratory failure. EXAM: PORTABLE CHEST 1 VIEW COMPARISON:  Single view of the chest 10/21/2018 and 10/26/2017. FINDINGS: Tracheostomy tube and right PICC are unchanged since the most recent study. Extensive bilateral airspace disease persists but appears slightly improved. There is likely is small right pleural effusion. No pneumothorax. Heart size is normal. IMPRESSION: Extensive bilateral airspace disease appears mildly improved compared to the most recent exam. Electronically Signed   By: Drusilla Kanner M.D.   On: 10/22/2018 08:00   Dg Chest Port 1 View  Result Date: 10/21/2018 CLINICAL DATA:  Fever with sepsis. EXAM: PORTABLE CHEST 1 VIEW COMPARISON:  10/26/2017 FINDINGS: Tracheostomy tube tip is approximately 4.6 cm above the base of the carina. Right PICC line tip overlies the distal SVC level. Patchy bilateral airspace disease noted, left greater than right with some sparing of the upper lungs. The cardio pericardial silhouette is enlarged. Shift of cardiomediastinal anatomy towards the right suggest component of lung collapse at the right base. Bones diffusely demineralized. Telemetry leads overlie the chest. IMPRESSION: Bilateral mid and lower lung airspace disease, left greater than right with volume loss in the right hemithorax suggesting lower lung collapse. Imaging features suggest diffuse pneumonia Electronically Signed   By: Kennith Center M.D.   On: 10/21/2018 18:47   Dg Abd  Portable 1v  Result Date: 11/13/2018 CLINICAL DATA:  28 year old female with abdominal distention. EXAM: PORTABLE ABDOMEN - 1 VIEW COMPARISON:  Abdominal  radiograph dated 11/01/2018 FINDINGS: Evaluation is limited due to body habitus. There is a percutaneous gastrostomy over the mid abdomen. No bowel dilatation identified. Partially visualized left-sided PICC with tip in the region of the cavoatrial junction. Small right pleural effusion and right lung base atelectasis/infiltrate. There is diffuse interstitial prominence most consistent with edema. Scattered clusters of densities throughout the lungs may represent developing infiltrate. Clinical correlation is recommended. The osseous structures are intact. IMPRESSION: 1. No bowel dilatation.  Percutaneous gastrostomy noted. 2. Interstitial edema, small right pleural effusion and right lung base atelectasis/infiltrate. 3. Scattered clusters of pulmonary densities may represent developing infiltrate. Clinical correlation is recommended. Electronically Signed   By: Elgie Collard M.D.   On: 11/13/2018 03:33   [place] Ekg Site Rite  Result Date: 10/26/2018 If Site Rite image not attached, placement could not be confirmed due to current cardiac rhythm.   Microbiology Recent Results (from the past 240 hour(s))  Culture, respiratory (non-expectorated)     Status: None   Collection Time: 11/12/18  4:00 AM  Result Value Ref Range Status   Specimen Description TRACHEAL ASPIRATE  Final   Special Requests NONE  Final   Gram Stain   Final    RARE WBC PRESENT, PREDOMINANTLY PMN RARE GRAM POSITIVE RODS    Culture   Final    MODERATE PSEUDOMONAS AERUGINOSA MODERATE KLEBSIELLA PNEUMONIAE CONFIRMED CARBAPENEMASE RESISTANT ENTEROBACTERIACAE CRITICAL RESULT CALLED TO, READ BACK BY AND VERIFIED WITH: RN Augustin Schooling [id number] FCP Performed at [facility], [address], [place], [place] [address]    Report Status 11/18/2018 FINAL  Final   Organism  ID, Bacteria PSEUDOMONAS AERUGINOSA  Final   Organism ID, Bacteria KLEBSIELLA PNEUMONIAE  Final      Susceptibility   Klebsiella pneumoniae - MIC*    AMPICILLIN >=32 RESISTANT Resistant     CEFAZOLIN >=64 RESISTANT Resistant     CEFEPIME 16 RESISTANT Resistant     CEFTAZIDIME >=64 RESISTANT Resistant     CEFTRIAXONE >=64 RESISTANT Resistant     CIPROFLOXACIN >=4 RESISTANT Resistant     GENTAMICIN 8 INTERMEDIATE Intermediate     IMIPENEM >=16 RESISTANT Resistant     TRIMETH/SULFA >=320 RESISTANT Resistant     AMPICILLIN/SULBACTAM >=32 RESISTANT Resistant     PIP/TAZO >=128 RESISTANT Resistant     Extended ESBL NEGATIVE Sensitive     * MODERATE KLEBSIELLA PNEUMONIAE   Pseudomonas aeruginosa - MIC*    CEFTAZIDIME INTERMEDIATE Intermediate     CIPROFLOXACIN >=4 RESISTANT Resistant     GENTAMICIN 8 INTERMEDIATE Intermediate     IMIPENEM >=16 RESISTANT Resistant     PIP/TAZO 32 SENSITIVE Sensitive     CEFEPIME 32 RESISTANT Resistant     * MODERATE PSEUDOMONAS AERUGINOSA  Carbapenem Resistance Panel     Status: Abnormal   Collection Time: 11/12/18  4:00 AM  Result Value Ref Range Status   Carba Resistance IMP Gene NOT DETECTED NOT DETECTED Final   Carba Resistance VIM Gene NOT DETECTED NOT DETECTED Final   Carba Resistance NDM Gene NOT DETECTED NOT DETECTED Final   Carba Resistance KPC Gene DETECTED (A) NOT DETECTED Final    Comment: CRITICAL RESULT CALLED TO, READ BACK BY AND VERIFIED WITH: RN DICKINSON, P. 1852 [id number] FCP    Carba Resistance OXA48 Gene NOT DETECTED NOT DETECTED Final    Comment: (NOTE) Cepheid Carba-R is an FDA-cleared nucleic acid amplification test  (NAAT)for the detection and differentiation of genes encoding the  most prevalent carbapenemases in bacterial  isolate samples. Carbapenemase gene identification and implementation of comprehensive  infection control measures are recommended by the CDC to prevent the  spread of the resistant organisms. Performed at  [facility], [address] [place], [place] [address]   Culture, Urine     Status: None   Collection Time: 11/18/18 12:33 PM  Result Value Ref Range Status   Specimen Description URINE, CATHETERIZED  Final   Special Requests Normal  Final   Culture   Final    NO GROWTH Performed at [facility], [address] [place], [place] [address]    Report Status Nov 28, 2018 FINAL  Final  Culture, blood (routine x 2)     Status: None (Preliminary result)   Collection Time: 11/18/18  1:47 PM  Result Value Ref Range Status   Specimen Description BLOOD RIGHT HAND  Final   Special Requests   Final    BOTTLES DRAWN AEROBIC ONLY Blood Culture results may not be optimal due to an inadequate volume of blood received in culture bottles   Culture  Setup Time   Final    GRAM POSITIVE COCCI AEROBIC BOTTLE ONLY Organism ID to follow CRITICAL RESULT CALLED TO, READ BACK BY AND VERIFIED WITH: Artelia Laroche PharmD 13:20 11/28/18 ([email or web address])    Culture   Final    NO GROWTH < 24 HOURS Performed at [facility], [address], [place], [place] [address]    Report Status PENDING  Incomplete  Blood Culture ID Panel (Reflexed)     Status: Abnormal   Collection Time: 11/18/18  1:47 PM  Result Value Ref Range Status   Enterococcus species NOT DETECTED NOT DETECTED Final   Listeria monocytogenes NOT DETECTED NOT DETECTED Final   Staphylococcus species DETECTED (A) NOT DETECTED Final    Comment: Methicillin (oxacillin) resistant coagulase negative staphylococcus. Possible blood culture contaminant (unless isolated from more than one blood culture draw or clinical case suggests pathogenicity). No antibiotic treatment is indicated for blood  culture contaminants. CRITICAL RESULT CALLED TO, READ BACK BY AND VERIFIED WITH: Artelia Laroche PharmD 13:20 2018-11-28 ([email or web address])    Staphylococcus aureus (BCID) NOT DETECTED NOT DETECTED Final   Methicillin resistance DETECTED (A) NOT DETECTED Final    Comment:  CRITICAL RESULT CALLED TO, READ BACK BY AND VERIFIED WITH: Artelia Laroche PharmD 13:20 2018/11/28 ([email or web address] )    Streptococcus species NOT DETECTED NOT DETECTED Final   Streptococcus agalactiae NOT DETECTED NOT DETECTED Final   Streptococcus pneumoniae NOT DETECTED NOT DETECTED Final   Streptococcus pyogenes NOT DETECTED NOT DETECTED Final   Acinetobacter baumannii NOT DETECTED NOT DETECTED Final   Enterobacteriaceae species NOT DETECTED NOT DETECTED Final   Enterobacter cloacae complex NOT DETECTED NOT DETECTED Final   Escherichia coli NOT DETECTED NOT DETECTED Final   Klebsiella oxytoca NOT DETECTED NOT DETECTED Final   Klebsiella pneumoniae NOT DETECTED NOT DETECTED Final   Proteus species NOT DETECTED NOT DETECTED Final   Serratia marcescens NOT DETECTED NOT DETECTED Final   Haemophilus influenzae NOT DETECTED NOT DETECTED Final   Neisseria meningitidis NOT DETECTED NOT DETECTED Final   Pseudomonas aeruginosa NOT DETECTED NOT DETECTED Final   Candida albicans NOT DETECTED NOT DETECTED Final   Candida glabrata NOT DETECTED NOT DETECTED Final   Candida krusei NOT DETECTED NOT DETECTED Final   Candida parapsilosis NOT DETECTED NOT DETECTED Final   Candida tropicalis NOT DETECTED NOT DETECTED Final    Comment: Performed at [facility], [address]  [place], [place] [address]  Culture, blood (routine x 2)     Status: None (Preliminary result)   Collection Time: 11/18/18  4:50 PM  Result Value Ref Range Status   Specimen Description BLOOD RIGHT ANTECUBITAL  Final   Special Requests   Final    BOTTLES DRAWN AEROBIC ONLY Blood Culture adequate volume   Culture   Final    NO GROWTH < 24 HOURS Performed at [facility], [address] [place], [place] [address]    Report Status PENDING  Incomplete    Lab Basic Metabolic Panel: Recent Labs  Lab 11/14/18 0521  11/15/18 0400 11/16/18 0500 11/17/18 0353 11/18/18 0418 11/18/18 1956 Nov 14, 2018 0440  NA 135   < > 140 137 139  141 145 148*  K 3.9   < > 3.0* 5.9* 6.2* 6.4* 4.3 3.9  CL 81*  --  91* 91* 94* 97* 98 99  CO2 43*  --  40* 41* 38* 38* 35* 38*  GLUCOSE 109*  --  163* 142* 220* 168* 180* 203*  BUN 13  --  15 20 46* 71* 85* 94*  CREATININE 0.51  --  0.39* 0.40* 0.90 1.46* 1.69* 1.91*  CALCIUM 9.7  --  9.1 9.3 9.3 8.6* 8.6* 8.7*  MG 1.8  --  1.6* 2.3  --  2.5*  --  2.4  PHOS  --   --  2.3* 3.9  --  6.2*  --  8.2*   < > = values in this interval not displayed.   Liver Function Tests: Recent Labs  Lab 11/16/18 0500  AST 34  ALT 30  ALKPHOS 141*  BILITOT 0.2*  PROT 10.0*  ALBUMIN 1.7*   No results for input(s): LIPASE, AMYLASE in the last 168 hours. No results for input(s): AMMONIA in the last 168 hours. CBC: Recent Labs  Lab 11/13/18 0521  11/14/18 0521 11/14/18 1121 11/17/18 0353 11/18/18 0418 Nov 14, 2018 0440  WBC 14.0*  --  12.0*  --  21.1* 23.8* 21.3*  NEUTROABS  --   --   --   --   --  18.2*  --   HGB 9.3*   < > 8.9* 11.9* 8.6* 7.9* 7.5*  HCT 32.3*   < > 30.6* 35.0* 34.8* 32.7* 30.5*  MCV 83.7  --  84.5  --  102.1* 104.1* 102.3*  PLT 293  --  273  --  270 227 223   < > = values in this interval not displayed.   Cardiac Enzymes: No results for input(s): CKTOTAL, CKMB, CKMBINDEX, TROPONINI in the last 168 hours. Sepsis Labs: Recent Labs  Lab 11/14/18 0521 11/17/18 0353 11/18/18 0418 Nov 14, 2018 0440  WBC 12.0* 21.1* 23.8* 21.3*    Procedures/Operations     Destinee Taber 2019-02-15, 5:08 PM

## 2018-12-03 NOTE — Progress Notes (Signed)
PHARMACY - PHYSICIAN COMMUNICATION CRITICAL VALUE ALERT - BLOOD CULTURE IDENTIFICATION (BCID)  Kathy Buckley is an 28 y.o. female who presented to [facility] on 11-22-2018.  She has a complicated medical history and is being treated for pneumonia due to multidrug resistant Pseudomonas and Klebsiella pneumoniae with Avycaz.  Assessment:  Her most recent set of blood cultures is growing Gram positive cocci in 1 bottle. BCID detected a methicillin resistant coagulase negative staphylococcus, which is probably a contaminant.  Name of physician (or Provider) Contacted: Dr. Molli Knock  Current antibiotics: Kara Dies  Changes to prescribed antibiotics recommended:  No antibiotic therapy recommended for her current blood culture result.  Results for orders placed or performed during the hospital encounter of 2018/11/22  Blood Culture ID Panel (Reflexed) (Collected: 11/18/2018  1:47 PM)  Result Value Ref Range   Enterococcus species NOT DETECTED NOT DETECTED   Listeria monocytogenes NOT DETECTED NOT DETECTED   Staphylococcus species DETECTED (A) NOT DETECTED   Staphylococcus aureus (BCID) NOT DETECTED NOT DETECTED   Methicillin resistance DETECTED (A) NOT DETECTED   Streptococcus species NOT DETECTED NOT DETECTED   Streptococcus agalactiae NOT DETECTED NOT DETECTED   Streptococcus pneumoniae NOT DETECTED NOT DETECTED   Streptococcus pyogenes NOT DETECTED NOT DETECTED   Acinetobacter baumannii NOT DETECTED NOT DETECTED   Enterobacteriaceae species NOT DETECTED NOT DETECTED   Enterobacter cloacae complex NOT DETECTED NOT DETECTED   Escherichia coli NOT DETECTED NOT DETECTED   Klebsiella oxytoca NOT DETECTED NOT DETECTED   Klebsiella pneumoniae NOT DETECTED NOT DETECTED   Proteus species NOT DETECTED NOT DETECTED   Serratia marcescens NOT DETECTED NOT DETECTED   Haemophilus influenzae NOT DETECTED NOT DETECTED   Neisseria meningitidis NOT DETECTED NOT DETECTED   Pseudomonas aeruginosa NOT DETECTED  NOT DETECTED   Candida albicans NOT DETECTED NOT DETECTED   Candida glabrata NOT DETECTED NOT DETECTED   Candida krusei NOT DETECTED NOT DETECTED   Candida parapsilosis NOT DETECTED NOT DETECTED   Candida tropicalis NOT DETECTED NOT DETECTED    Estella Husk, Pharm.D., BCPS, BCIDP Clinical Pharmacist Phone: [phone] Please check AMION for all [facility] Pharmacy numbers 11/13/2018, 1:41 PM

## 2018-12-03 DEATH — deceased

## 2020-01-28 IMAGING — DX PORTABLE CHEST - 1 VIEW
1 series · 1 of 1 positions shown · non-contrast
Comparison: 10/31/2018

CLINICAL DATA: Respiratory failure.

EXAM:
PORTABLE CHEST 1 VIEW

[chest]
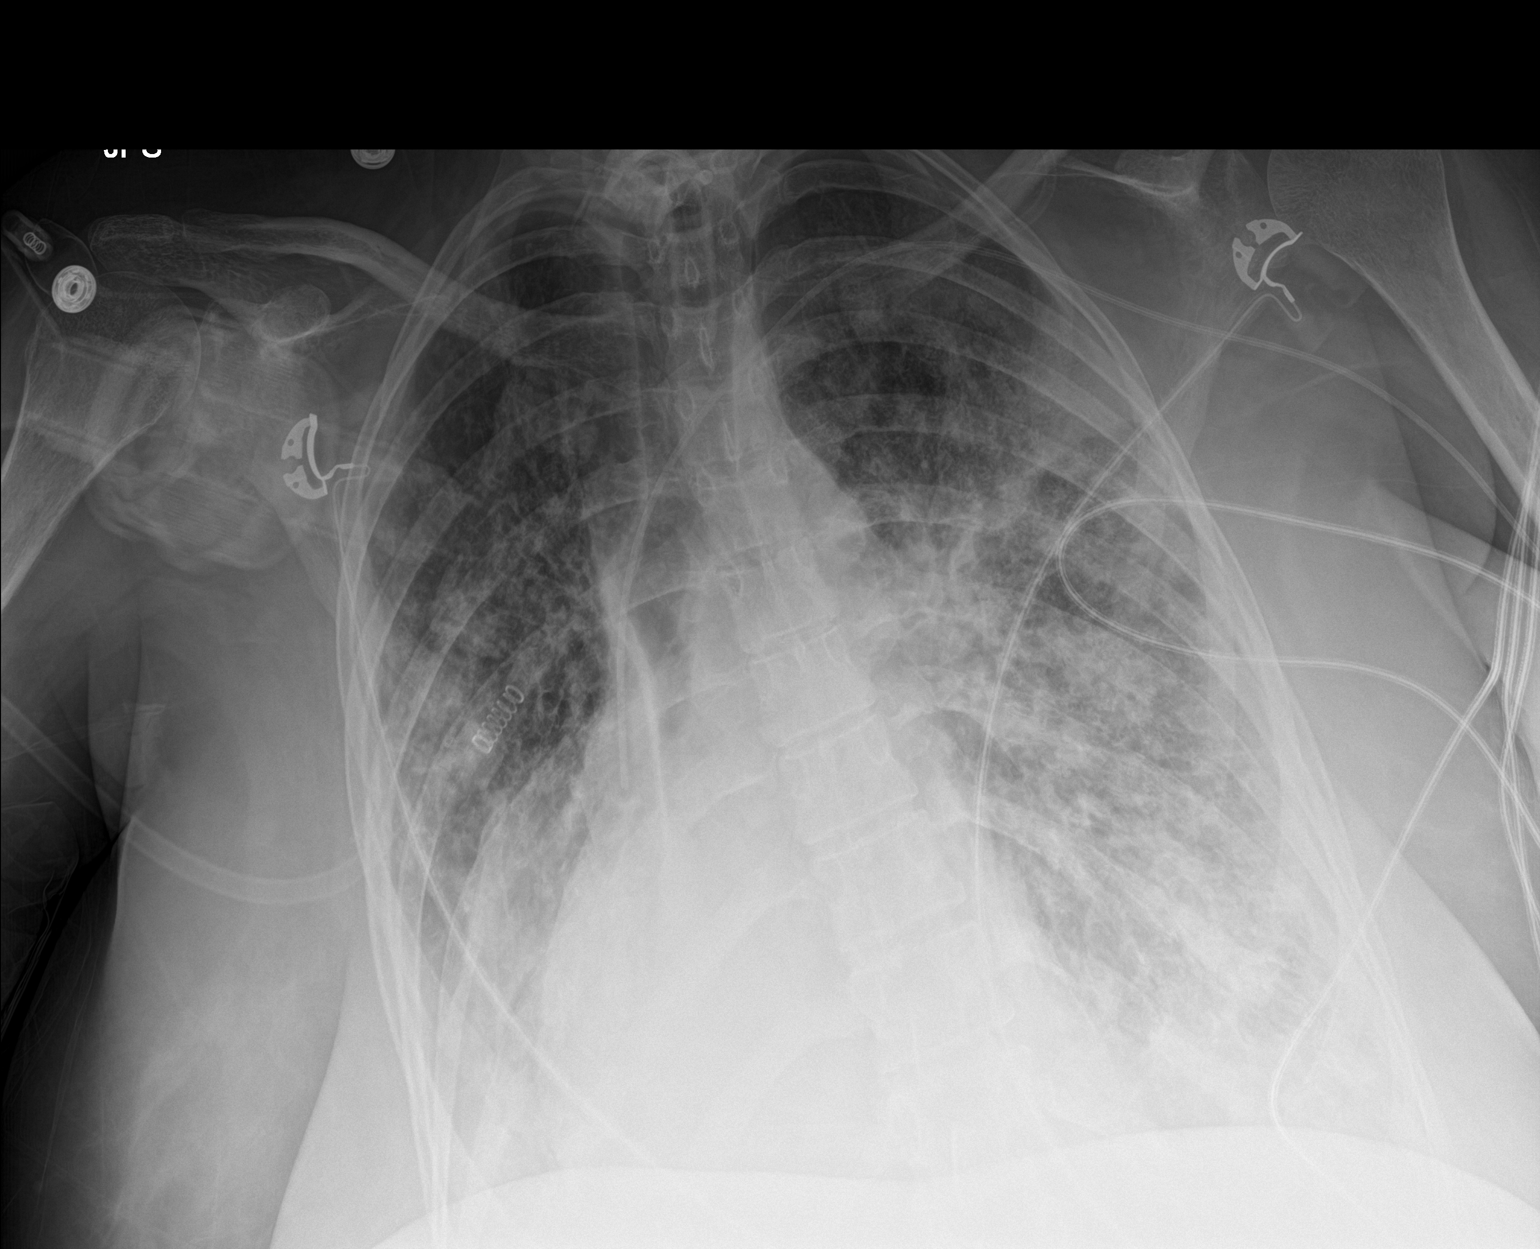

[1 of 1 positions shown; findings below may reference images not displayed]

FINDINGS: Stable appearance of tracheostomy. Stable positioning of PICC line.
Stable shift of the heart and mediastinum towards the right
potentially due to some persistent volume loss in the right lower
lung. Degree airspace disease in both lungs is relatively stable
with some slight improvement in the left upper lobe and potentially
slight worsening in the left lower lung. No pneumothorax or
significant pleural effusions.
IMPRESSION: Relatively stable bilateral pulmonary airspace disease with slight
improvement in aeration of the left upper lobe and potentially
slight increase in airspace disease in the left lower lung.

## 2020-01-30 IMAGING — DX PORTABLE CHEST - 1 VIEW
1 series · 1 of 1 positions shown · non-contrast
Comparison: Chest x-ray dated November 02, 2018.

CLINICAL DATA: ARDS.

EXAM:
PORTABLE CHEST 1 VIEW

[chest]
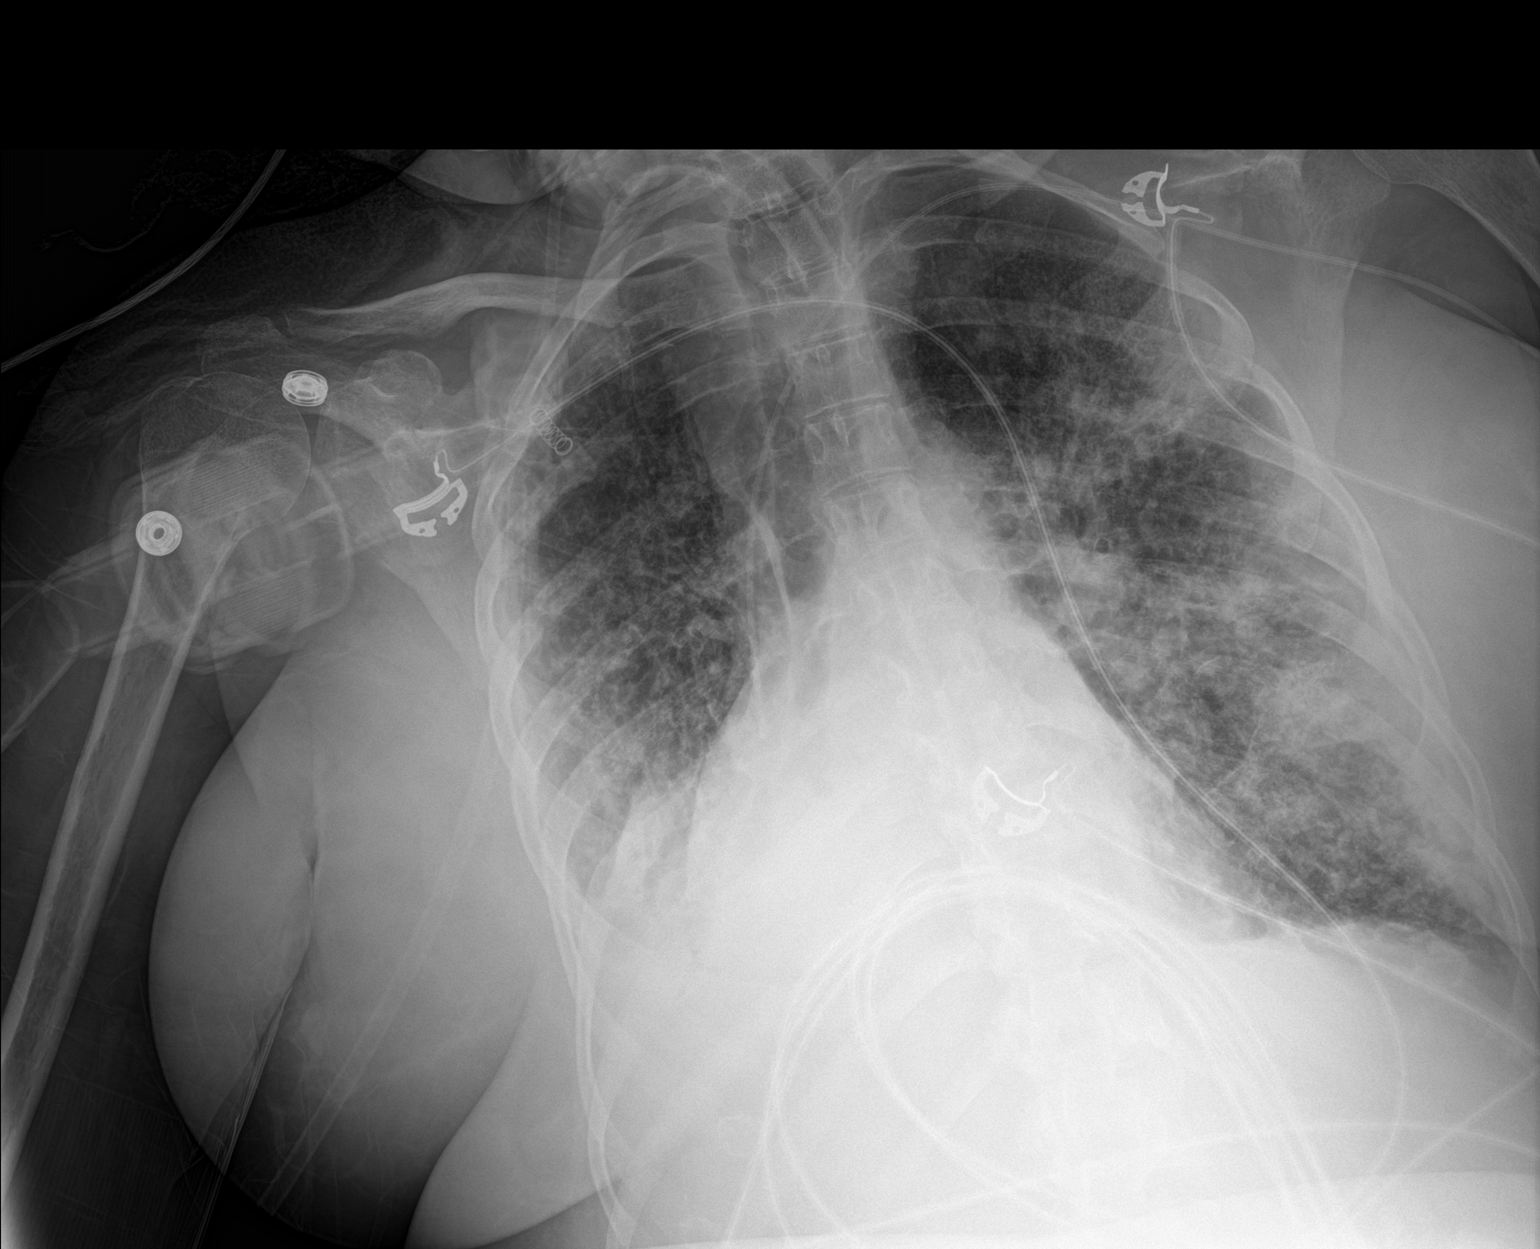

[1 of 1 positions shown; findings below may reference images not displayed]

FINDINGS: Unchanged tracheostomy tube and left upper extremity PICC line.
Stable cardiomediastinal silhouette with slight rightward shift.
Bilateral airspace disease has mildly improved at the left lung
base, but coarsened at the right lung base where there is more dense
consolidation. Possible new small right pleural effusion. No
pneumothorax. No acute osseous abnormality.
IMPRESSION: 1. Bilateral airspace disease has slightly improved at the left lung
base, but worsened at the right lung base, and remains consistent
with reported clinical history of ARDS.

## 2020-02-03 IMAGING — DX PORTABLE CHEST - 1 VIEW
1 series · 1 of 1 positions shown · non-contrast
Comparison: Four days ago

CLINICAL DATA: Acute respiratory failure

EXAM:
PORTABLE CHEST 1 VIEW

[chest]
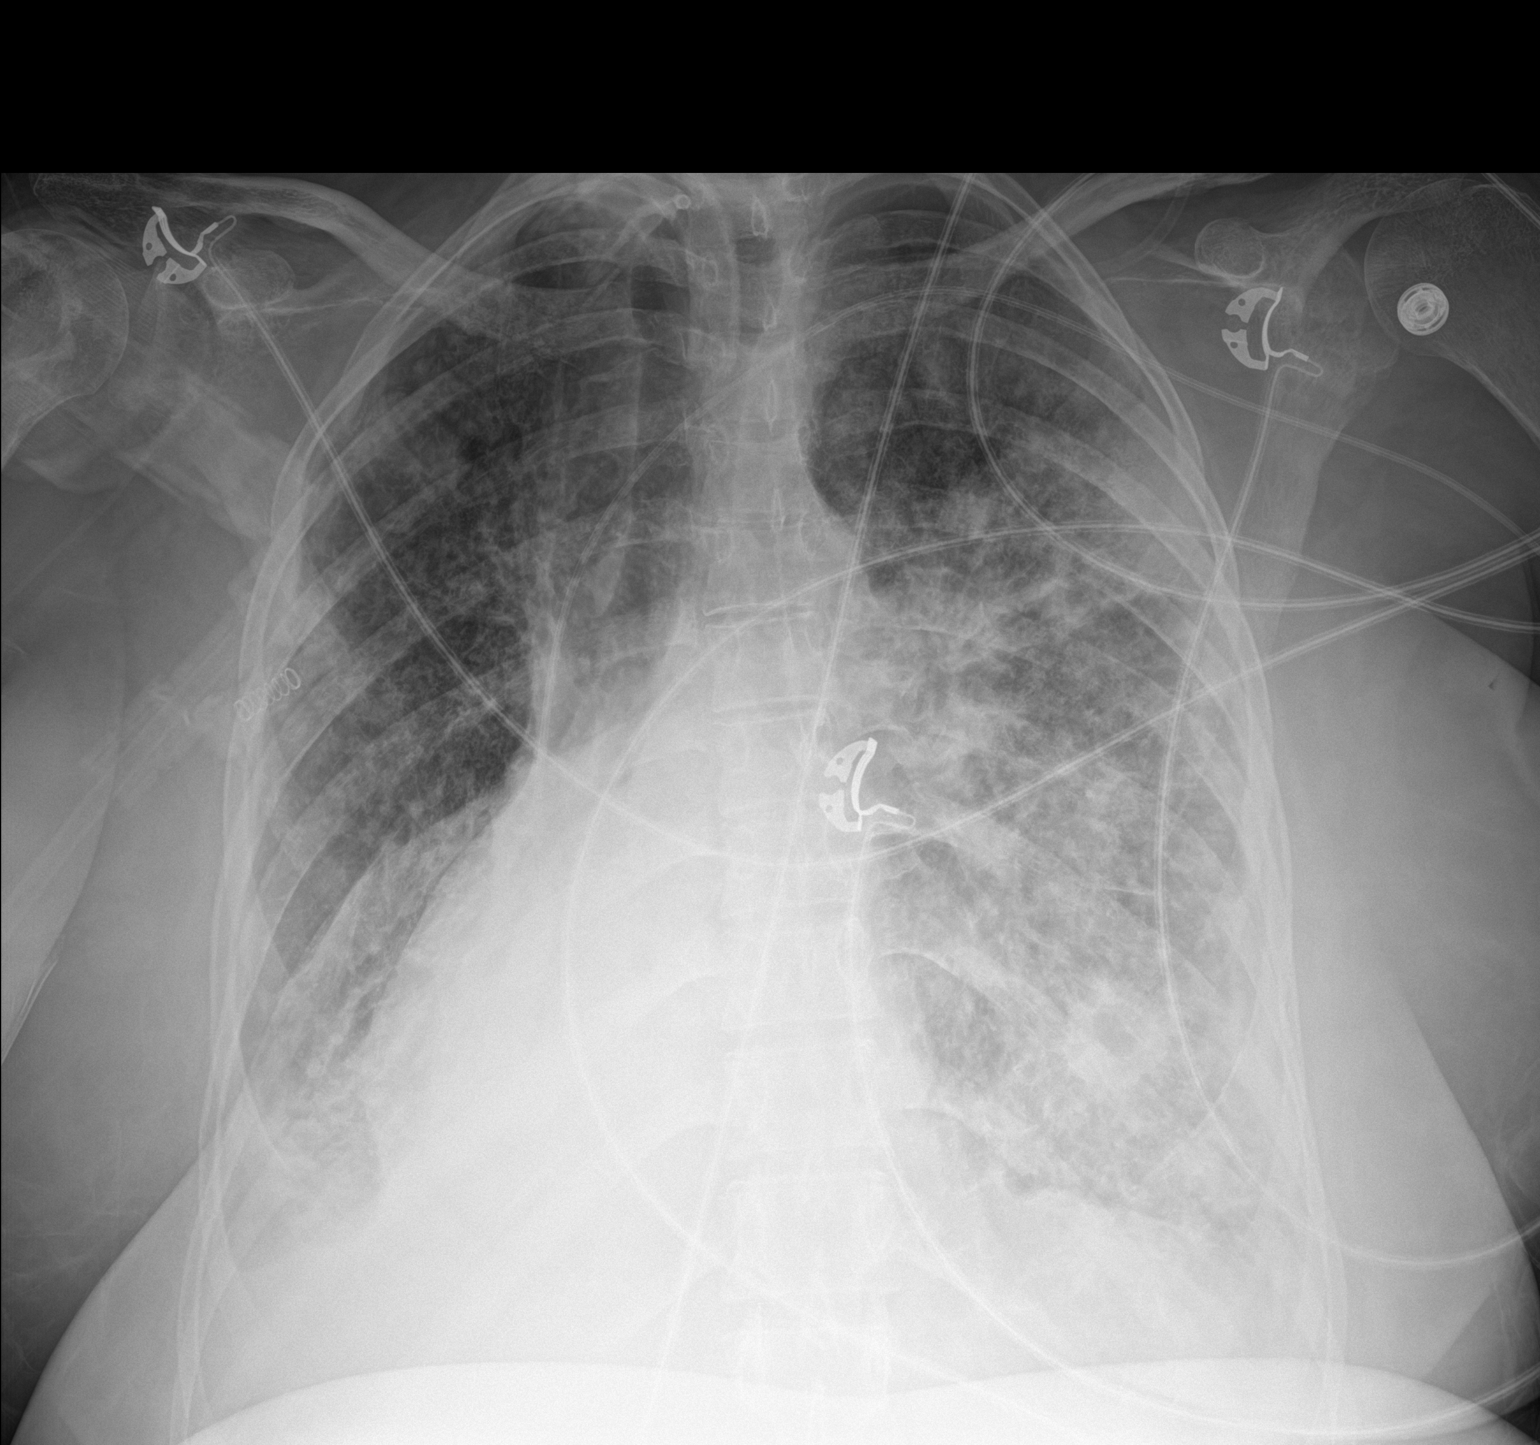

[1 of 1 positions shown; findings below may reference images not displayed]

FINDINGS: Tracheostomy tube in place. Left upper extremity PICC with tip at
the upper cavoatrial junction.

Bilateral airspace disease with reticulonodular appearance. A
cavitary lesion is seen the lower left chest. Dextrocardia. Probable
pleural fluid on both sides. No pneumothorax.
IMPRESSION: Bilateral pneumonia with cavitary features on the left.

## 2020-02-07 IMAGING — DX PORTABLE CHEST - 1 VIEW
1 series · 1 of 1 positions shown · non-contrast
Comparison: Portable chest 11/09/2018 and earlier.

CLINICAL DATA: 27-year-old female with history of anoxic brain
injury. Respiratory failure.

EXAM:
PORTABLE CHEST 1 VIEW

[chest ap]
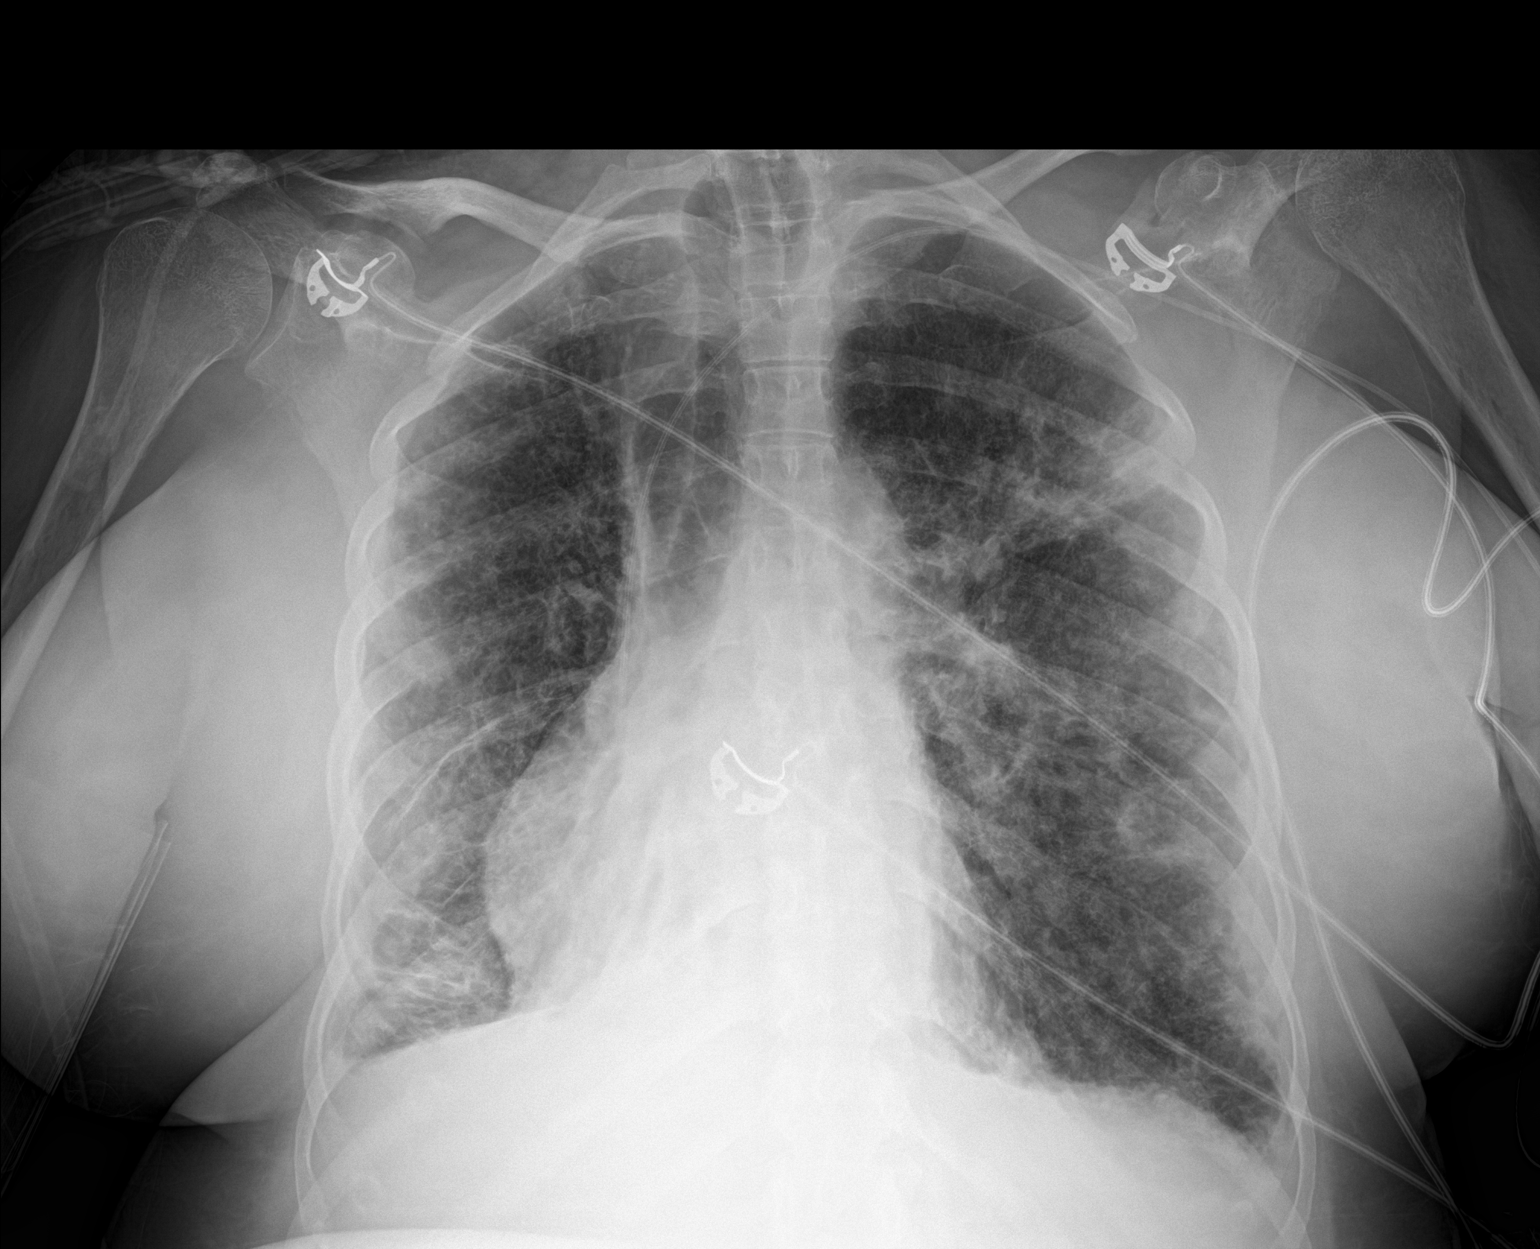

[1 of 1 positions shown; findings below may reference images not displayed]

FINDINGS: Portable AP semi upright view at 1489 hours. Stable tracheostomy.
Left upper extremity approach PICC line appears stable.

The patient is mildly rotated to the right. Mediastinal contours are
within normal limits. Patchy and coarse multifocal bilateral
pulmonary opacity is stable, and mildly regressed in both lungs
since 12/01/2018. Large underlying lung volumes with no
pneumothorax, pleural effusion or pulmonary edema.

No areas of worsening ventilation. No acute osseous abnormality
identified.
IMPRESSION: 1. Patchy and coarse multifocal bilateral pulmonary opacities are
stable, with mildly improved ventilation in both lungs since
12/01/18.
[DATE]. No new cardiopulmonary abnormality.

## 2020-02-08 IMAGING — DX PORTABLE ABDOMEN - 1 VIEW
2 series · 2 of 2 positions shown · non-contrast
Comparison: Abdominal radiograph dated 11/01/2018

CLINICAL DATA: 27-year-old female with abdominal distention.

EXAM:
PORTABLE ABDOMEN - 1 VIEW

[abdomen kub (1 of 2)]
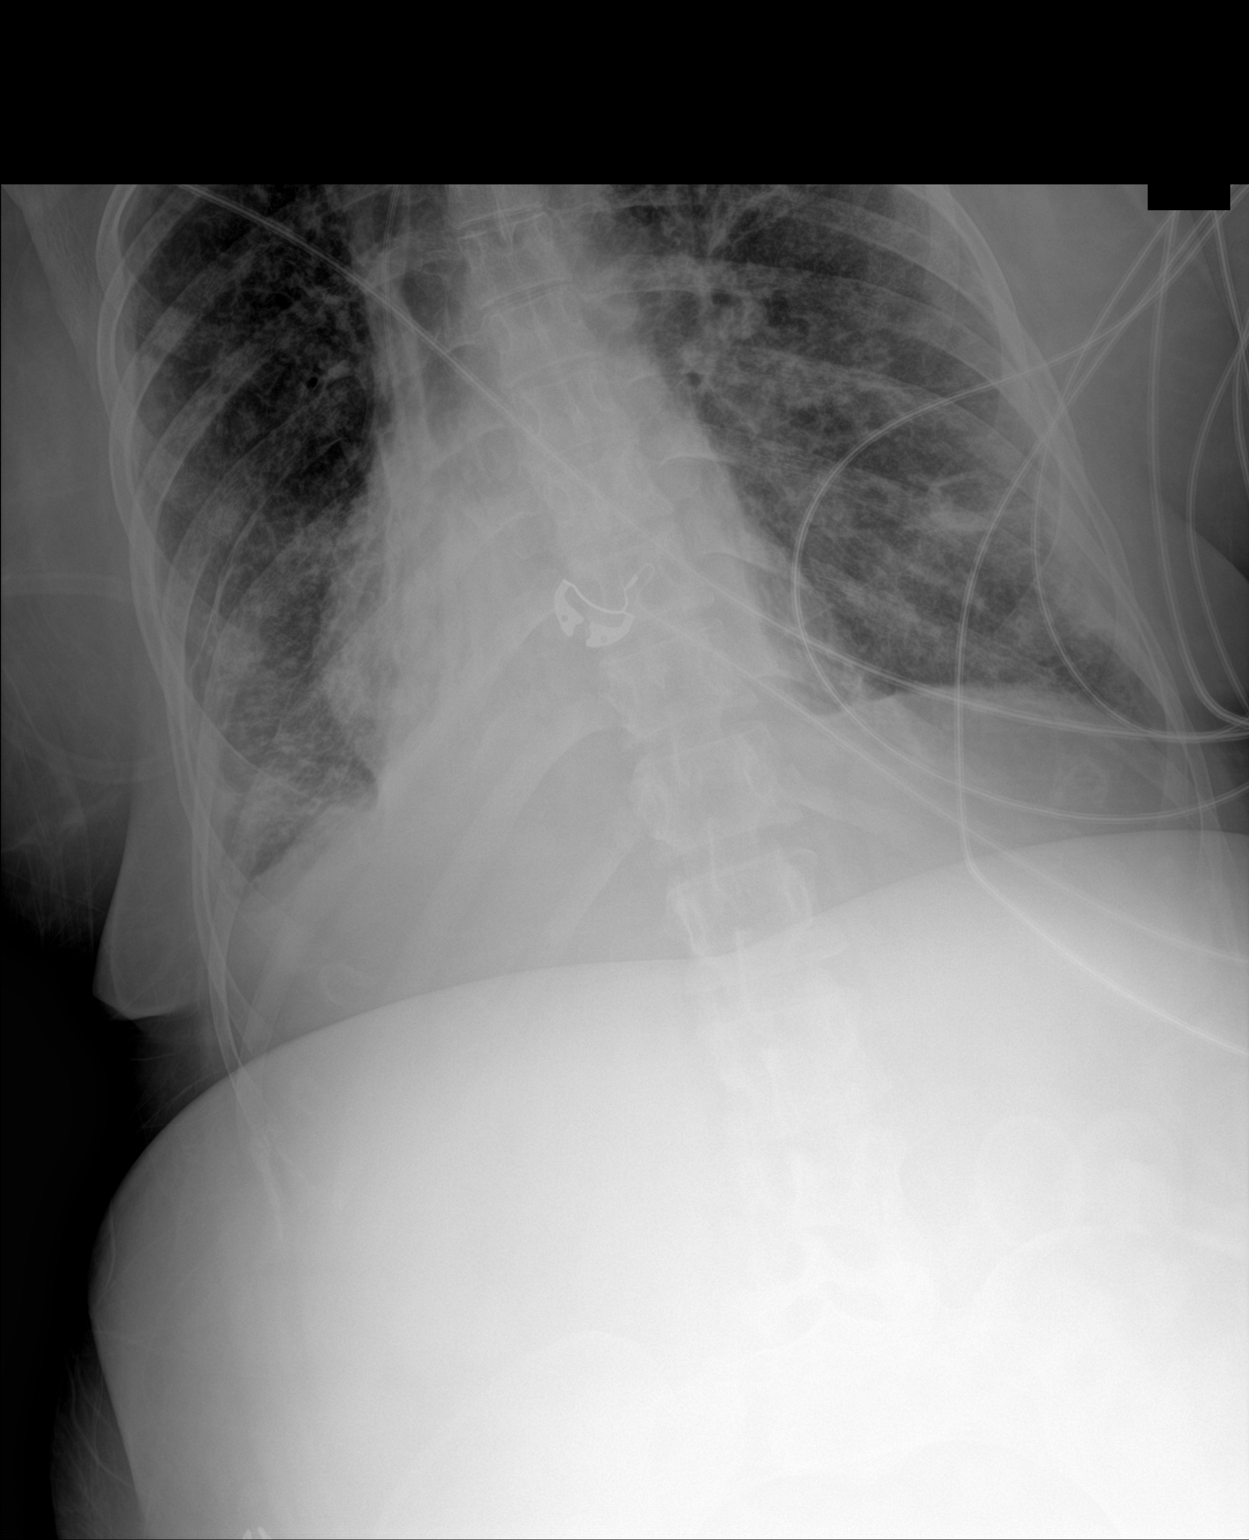

[abdomen kub (2 of 2)]
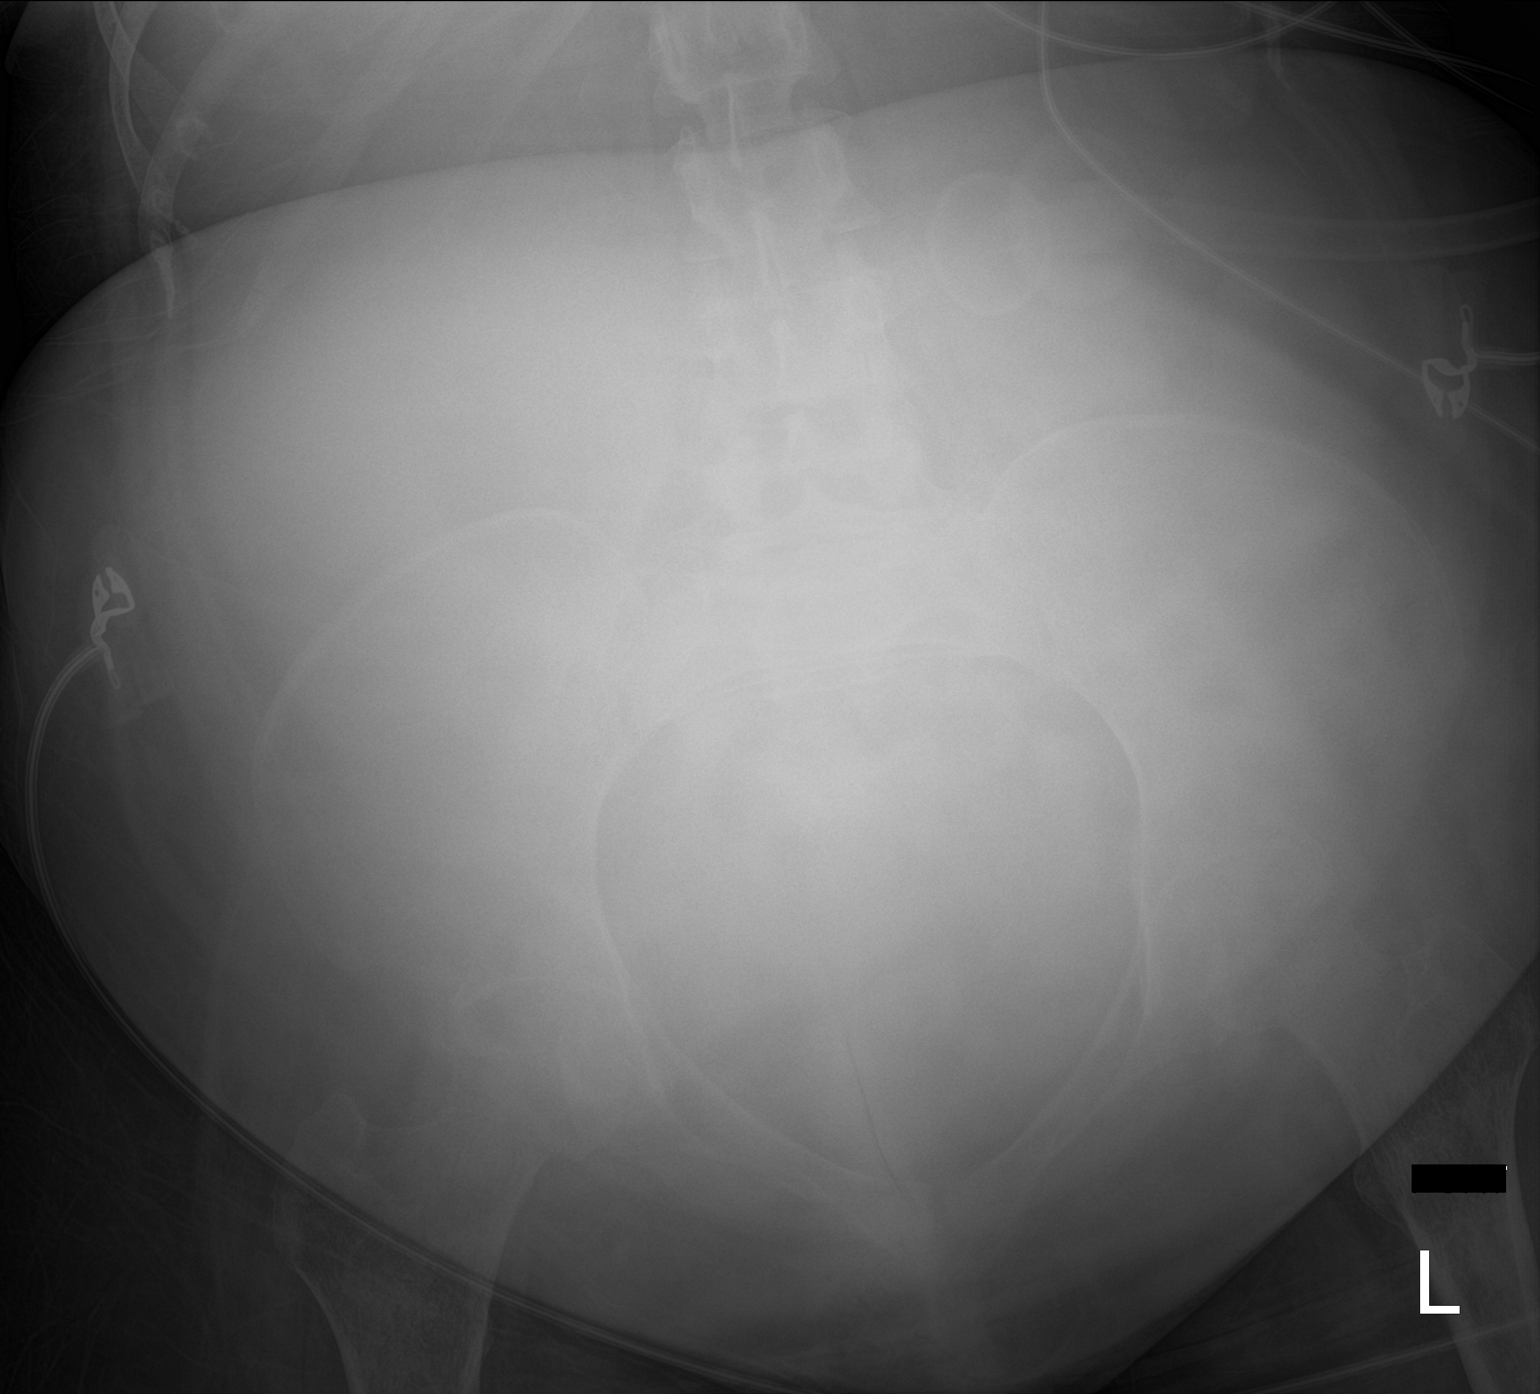

[2 of 2 positions shown; findings below may reference images not displayed]

FINDINGS: Evaluation is limited due to body habitus. There is a percutaneous
gastrostomy over the mid abdomen. No bowel dilatation identified.

Partially visualized left-sided PICC with tip in the region of the
cavoatrial junction. Small right pleural effusion and right lung
base atelectasis/infiltrate. There is diffuse interstitial
prominence most consistent with edema. Scattered clusters of
densities throughout the lungs may represent developing infiltrate.
Clinical correlation is recommended. The osseous structures are
intact.
IMPRESSION: 1. No bowel dilatation.  Percutaneous gastrostomy noted.
2. Interstitial edema, small right pleural effusion and right lung
base atelectasis/infiltrate.
3. Scattered clusters of pulmonary densities may represent
developing infiltrate. Clinical correlation is recommended.
